# Patient Record
Sex: Male | Born: 1955
Health system: Southern US, Community
[De-identification: ages and names within clinical notes are randomized; demographics above are authoritative.]

## PROBLEM LIST (undated history)

## (undated) DIAGNOSIS — R918 Other nonspecific abnormal finding of lung field: Secondary | ICD-10-CM

## (undated) DIAGNOSIS — R9439 Abnormal result of other cardiovascular function study: Secondary | ICD-10-CM

## (undated) DIAGNOSIS — E785 Hyperlipidemia, unspecified: Secondary | ICD-10-CM

## (undated) DIAGNOSIS — Z87891 Personal history of nicotine dependence: Secondary | ICD-10-CM

## (undated) DIAGNOSIS — E119 Type 2 diabetes mellitus without complications: Secondary | ICD-10-CM

## (undated) DIAGNOSIS — I255 Ischemic cardiomyopathy: Secondary | ICD-10-CM

## (undated) DIAGNOSIS — I5020 Unspecified systolic (congestive) heart failure: Secondary | ICD-10-CM

## (undated) DIAGNOSIS — I5022 Chronic systolic (congestive) heart failure: Secondary | ICD-10-CM

## (undated) DIAGNOSIS — I2510 Atherosclerotic heart disease of native coronary artery without angina pectoris: Secondary | ICD-10-CM

## (undated) DIAGNOSIS — Z720 Tobacco use: Secondary | ICD-10-CM

## (undated) HISTORY — DX: Unspecified systolic (congestive) heart failure: I50.20

## (undated) HISTORY — DX: Personal history of nicotine dependence: Z87.891

## (undated) HISTORY — DX: Other nonspecific abnormal finding of lung field: R91.8

## (undated) HISTORY — PX: TONSILLECTOMY: SHX5217

## (undated) HISTORY — DX: Abnormal result of other cardiovascular function study: R94.39

## (undated) HISTORY — PX: TONSILLECTOMY: SUR1361

## (undated) HISTORY — DX: Type 2 diabetes mellitus without complications: E11.9

## (undated) HISTORY — DX: Chronic systolic (congestive) heart failure: I50.22

---

## 2007-08-06 ENCOUNTER — Ambulatory Visit: Payer: Self-pay | Admitting: Internal Medicine

## 2008-08-02 IMAGING — CR DG CHEST 2V
1 series · 2 of 2 positions shown · non-contrast
Comparison: none

REASON FOR EXAM: rt lower  chest pain
COMMENTS:

[Series 1: view not recorded · 0.17mm/px · 2 of 2 slices shown]
[im 1/2]
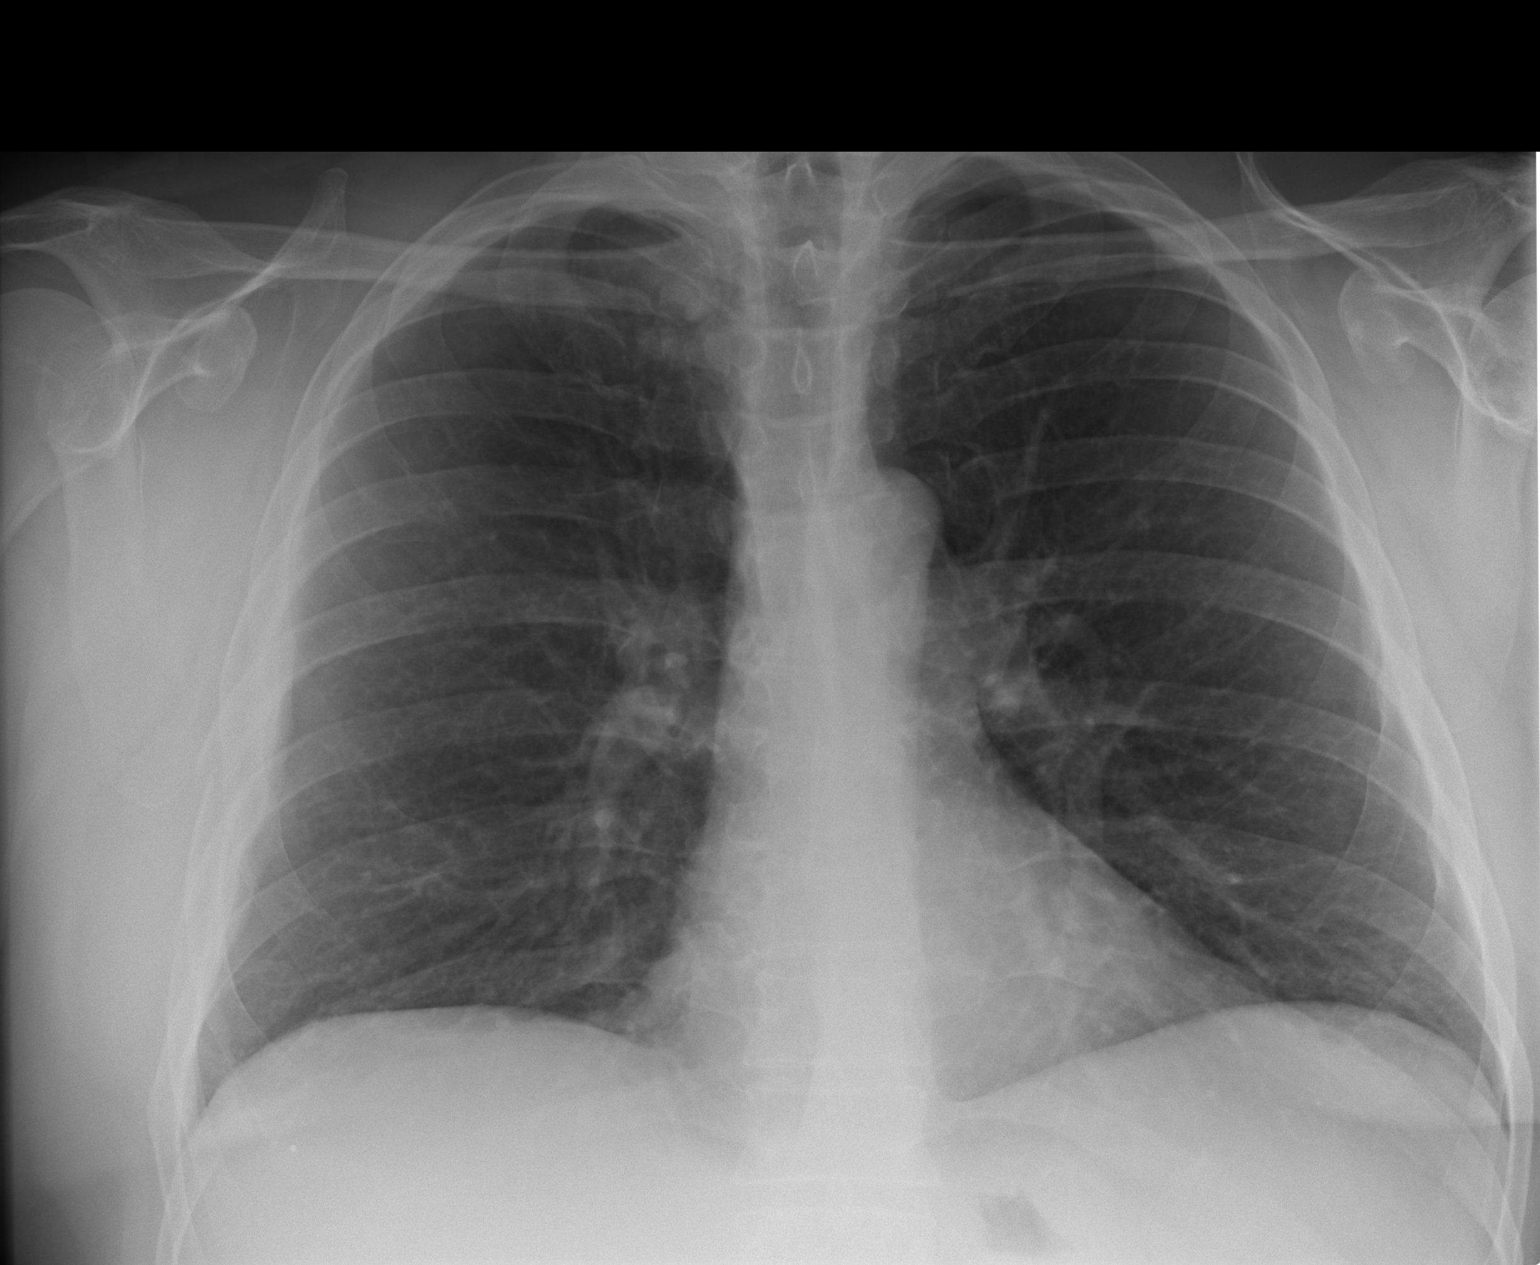
[im 2/2]
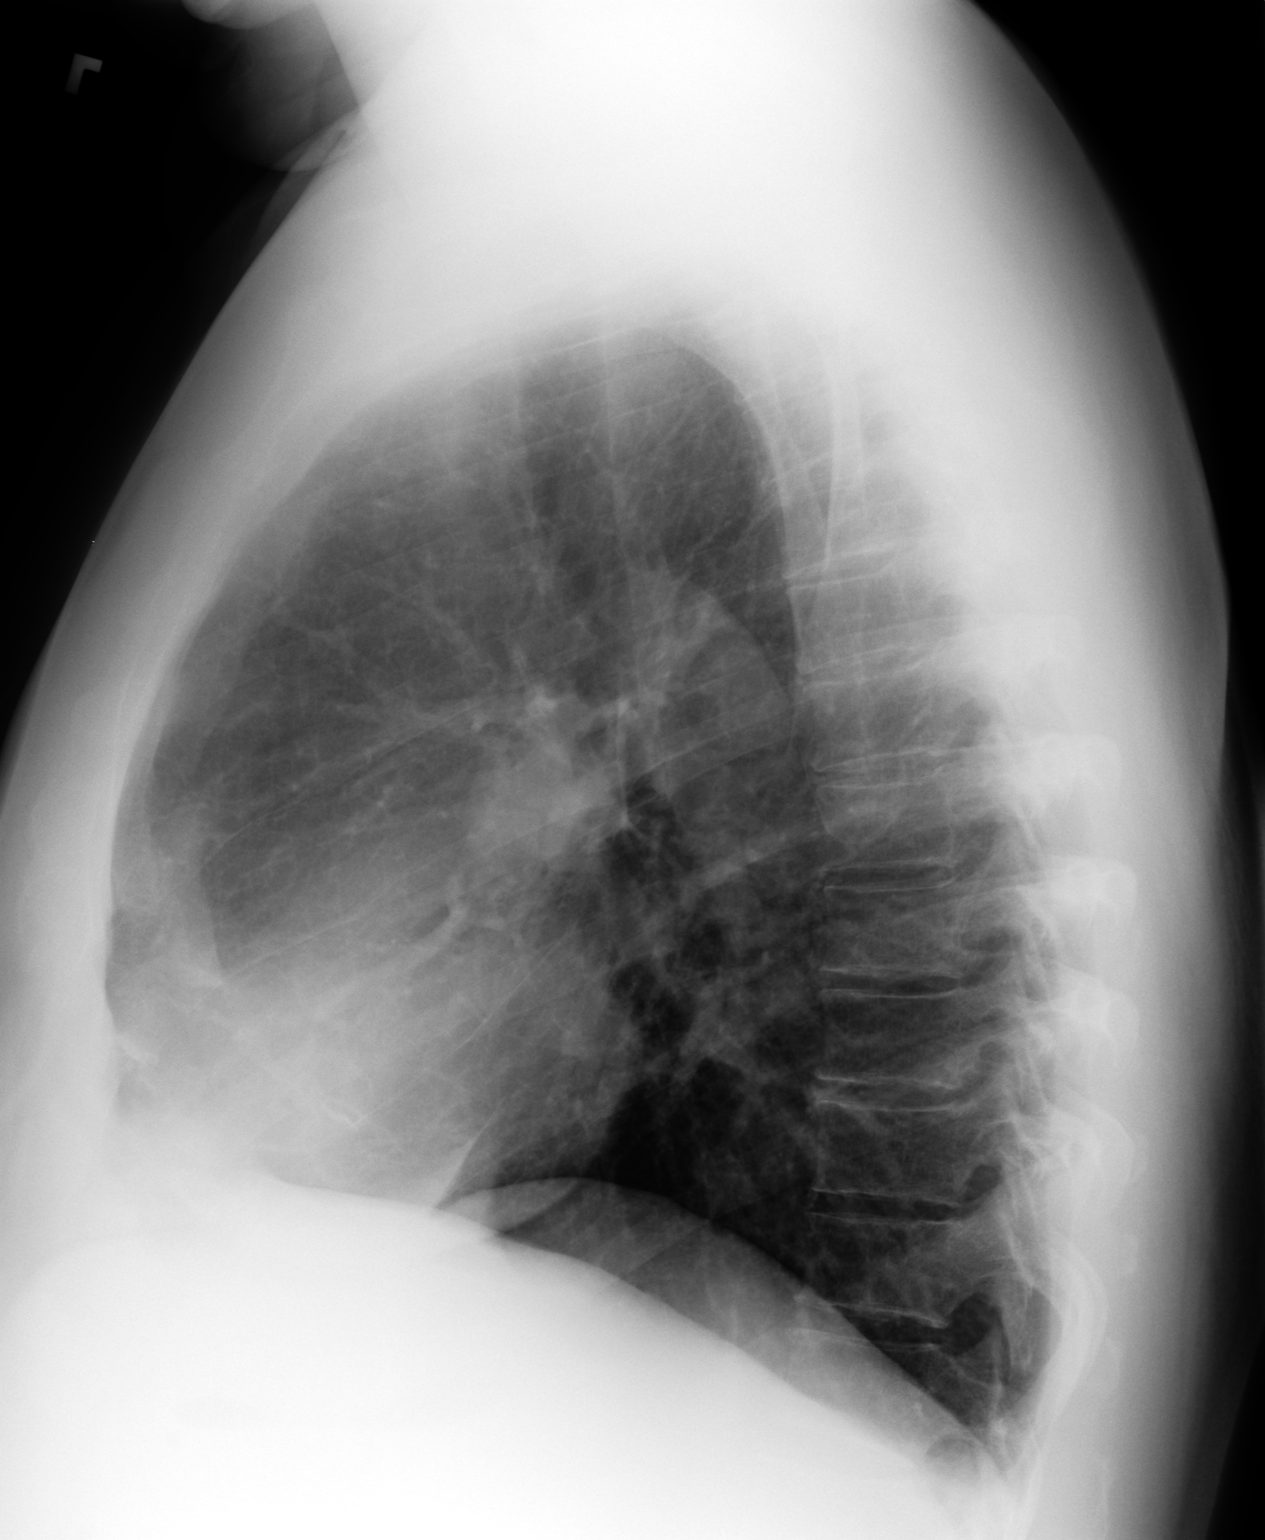

[2 of 2 positions shown; findings below may reference images not displayed]

PROCEDURE:     DXR - DXR CHEST PA (OR AP) AND LATERAL  - August 06, 2007 [DATE]

RESULT:     There is no prior exam for comparison. The lungs are fully
inflated. The patient has RIGHT rib fractures as demonstrated on the
previous rib series. There is no infiltrate, effusion or pneumothorax. There
is no pulmonary edema. The cardiac silhouette is normal in size.
IMPRESSION: No evidence of pneumothorax. No acute cardiopulmonary disease. RIGHT rib
fractures as seen on the separately dictated rib series.

## 2014-10-22 ENCOUNTER — Ambulatory Visit (INDEPENDENT_AMBULATORY_CARE_PROVIDER_SITE_OTHER): Payer: Self-pay | Admitting: Family Medicine

## 2014-10-22 VITALS — BP 126/80 | HR 105 | Temp 98.2°F | Resp 18 | Ht 69.0 in | Wt 286.0 lb

## 2014-10-22 DIAGNOSIS — Z029 Encounter for administrative examinations, unspecified: Secondary | ICD-10-CM

## 2014-10-22 DIAGNOSIS — Z024 Encounter for examination for driving license: Secondary | ICD-10-CM

## 2014-10-22 DIAGNOSIS — J42 Unspecified chronic bronchitis: Secondary | ICD-10-CM

## 2014-10-22 NOTE — Patient Instructions (Signed)
Lose weight  Stop smoking  Get a physical sometime soon.

## 2014-10-22 NOTE — Progress Notes (Signed)
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 He said he was up to 335 pounds and is lost quite a bit of weight. He is working hard at that.  Past medical history Operations: None except for his tonsillectomy Medical illnesses: No major illnesses Medication allergies: None Regular medications: None   Family history: Mother died of diabetes, father of an infected ulcer on his spine  Social history: Married, [place], wife is a Engineer, civil (consulting)nurse. He knows he needs to start getting regular exercise but has not been doing so at this time.  Review of systems: Unremarkable  Physical exam: Overweight male in no major distress. His TMs normal. Eyes PERRLA. Throat clear. Neck supple without nodes. Chest is course and deep breathing triggers coughing.Marland Kitchen. Heart regular without murmurs gallops. No carotid bruits. Abdomen is large, nor Megaly, masses or tenderness. He has a bruise on the right abdominal wall from pushing equipment with his body. Normal male external genitalia with testes descended. No hernias. Extremities unremarkable. Skin otherwise unremarkable.  Assessment: DOT physical examination Obesity Pulmonary congestion, probably chronic bronchitis from smoking  Plan: 2 year DOT card Urged him to continue working hard on weight loss Urgent to get away from the cigarettes. He should still get a complete physical with labs if he has not done so in recent years.

## 2015-09-30 ENCOUNTER — Inpatient Hospital Stay (HOSPITAL_COMMUNITY)
Admit: 2015-09-30 | Discharge: 2015-09-30 | Disposition: A | Discharge status: Home / Self Care | Payer: 59 | Attending: Cardiovascular Disease | Admitting: Cardiovascular Disease

## 2015-09-30 ENCOUNTER — Inpatient Hospital Stay
Admission: EM | Admit: 2015-09-30 | Discharge: 2015-10-02 | DRG: 247 | Disposition: A | Discharge status: Home / Self Care | Payer: 59 | Attending: Cardiovascular Disease | Admitting: Cardiovascular Disease

## 2015-09-30 ENCOUNTER — Encounter
Admission: EM | Disposition: A | Discharge status: Home / Self Care | Payer: Self-pay | Source: Home / Self Care | Attending: Cardiovascular Disease

## 2015-09-30 ENCOUNTER — Emergency Department: Payer: 59

## 2015-09-30 ENCOUNTER — Encounter: Payer: Self-pay | Admitting: *Deleted

## 2015-09-30 DIAGNOSIS — Z87891 Personal history of nicotine dependence: Secondary | ICD-10-CM

## 2015-09-30 DIAGNOSIS — I2102 ST elevation (STEMI) myocardial infarction involving left anterior descending coronary artery: Principal | ICD-10-CM | POA: Diagnosis present

## 2015-09-30 DIAGNOSIS — I213 ST elevation (STEMI) myocardial infarction of unspecified site: Secondary | ICD-10-CM | POA: Diagnosis not present

## 2015-09-30 DIAGNOSIS — I255 Ischemic cardiomyopathy: Secondary | ICD-10-CM | POA: Diagnosis present

## 2015-09-30 DIAGNOSIS — E1165 Type 2 diabetes mellitus with hyperglycemia: Secondary | ICD-10-CM | POA: Diagnosis present

## 2015-09-30 DIAGNOSIS — Z8249 Family history of ischemic heart disease and other diseases of the circulatory system: Secondary | ICD-10-CM | POA: Diagnosis not present

## 2015-09-30 DIAGNOSIS — I2582 Chronic total occlusion of coronary artery: Secondary | ICD-10-CM | POA: Diagnosis present

## 2015-09-30 DIAGNOSIS — E785 Hyperlipidemia, unspecified: Secondary | ICD-10-CM | POA: Diagnosis present

## 2015-09-30 DIAGNOSIS — Z720 Tobacco use: Secondary | ICD-10-CM

## 2015-09-30 DIAGNOSIS — I2510 Atherosclerotic heart disease of native coronary artery without angina pectoris: Secondary | ICD-10-CM | POA: Diagnosis present

## 2015-09-30 DIAGNOSIS — E1169 Type 2 diabetes mellitus with other specified complication: Secondary | ICD-10-CM

## 2015-09-30 DIAGNOSIS — R079 Chest pain, unspecified: Secondary | ICD-10-CM | POA: Diagnosis not present

## 2015-09-30 DIAGNOSIS — Z6841 Body Mass Index (BMI) 40.0 and over, adult: Secondary | ICD-10-CM | POA: Diagnosis not present

## 2015-09-30 DIAGNOSIS — Z833 Family history of diabetes mellitus: Secondary | ICD-10-CM

## 2015-09-30 DIAGNOSIS — E118 Type 2 diabetes mellitus with unspecified complications: Secondary | ICD-10-CM | POA: Insufficient documentation

## 2015-09-30 HISTORY — PX: CARDIAC CATHETERIZATION: SHX172

## 2015-09-30 HISTORY — DX: Atherosclerotic heart disease of native coronary artery without angina pectoris: I25.10

## 2015-09-30 HISTORY — DX: Hyperlipidemia, unspecified: E78.5

## 2015-09-30 HISTORY — DX: Tobacco use: Z72.0

## 2015-09-30 HISTORY — DX: Morbid (severe) obesity due to excess calories: E66.01

## 2015-09-30 HISTORY — DX: Ischemic cardiomyopathy: I25.5

## 2015-09-30 LAB — BASIC METABOLIC PANEL
Anion gap: 9 (ref 5–15)
BUN: 19 mg/dL (ref 6–20)
CO2: 24 mmol/L (ref 22–32)
Calcium: 8.8 mg/dL — ABNORMAL LOW (ref 8.9–10.3)
Chloride: 102 mmol/L (ref 101–111)
Creatinine, Ser: 1.05 mg/dL (ref 0.61–1.24)
GFR calc Af Amer: >60 mL/min (ref >60)
GFR calc non Af Amer: >60 mL/min (ref >60)
Glucose, Bld: 278 mg/dL — ABNORMAL HIGH (ref 65–99)
Potassium: 3.8 mmol/L (ref 3.5–5.1)
Sodium: 135 mmol/L (ref 135–145)

## 2015-09-30 LAB — PROTIME-INR
INR: 1.05
Prothrombin Time: 13.9 seconds (ref 11.4–15.0)

## 2015-09-30 LAB — TROPONIN I
Troponin I: 0.06 ng/mL — ABNORMAL HIGH (ref <0.031)
Troponin I: 39.8 ng/mL — ABNORMAL HIGH (ref <0.031)

## 2015-09-30 LAB — CBC
HCT: 42.5 % (ref 40.0–52.0)
Hemoglobin: 14.4 g/dL (ref 13.0–18.0)
MCH: 29.3 pg (ref 26.0–34.0)
MCHC: 33.9 g/dL (ref 32.0–36.0)
MCV: 86.5 fL (ref 80.0–100.0)
Platelets: 224 10*3/uL (ref 150–440)
RBC: 4.92 MIL/uL (ref 4.40–5.90)
RDW: 14.1 % (ref 11.5–14.5)
WBC: 9.1 10*3/uL (ref 3.8–10.6)

## 2015-09-30 LAB — ECHOCARDIOGRAM COMPLETE
Height: 71 in
Weight: 4800 oz

## 2015-09-30 LAB — APTT: aPTT: 68 seconds — ABNORMAL HIGH (ref 24–36)

## 2015-09-30 LAB — MRSA PCR SCREENING: MRSA by PCR: NEGATIVE

## 2015-09-30 SURGERY — LEFT HEART CATH AND CORONARY ANGIOGRAPHY
Anesthesia: Moderate Sedation

## 2015-09-30 MED ORDER — ONDANSETRON HCL 4 MG/2ML IJ SOLN: 4.0000 mg | Freq: Four times a day (QID) | INTRAMUSCULAR | Status: DC | PRN

## 2015-09-30 MED ORDER — HEPARIN (PORCINE) IN NACL 100-0.45 UNIT/ML-% IJ SOLN
1400.0000 [IU]/h | INTRAMUSCULAR | Status: DC
  Filled 2015-09-30: qty 250

## 2015-09-30 MED ORDER — VERAPAMIL HCL 2.5 MG/ML IV SOLN
INTRAVENOUS | Status: DC | PRN
  Administered 2015-09-30: 2.5 mg via INTRA_ARTERIAL

## 2015-09-30 MED ORDER — TICAGRELOR 90 MG PO TABS
ORAL_TABLET | ORAL | Status: AC
  Filled 2015-09-30: qty 2

## 2015-09-30 MED ORDER — MIDAZOLAM HCL 2 MG/2ML IJ SOLN
INTRAMUSCULAR | Status: AC
  Filled 2015-09-30: qty 2

## 2015-09-30 MED ORDER — IOHEXOL 300 MG/ML  SOLN
INTRAMUSCULAR | Status: DC | PRN
  Administered 2015-09-30: 180 mL via INTRA_ARTERIAL

## 2015-09-30 MED ORDER — SODIUM CHLORIDE 0.9 % IV SOLN: INTRAVENOUS | Status: AC

## 2015-09-30 MED ORDER — NITROGLYCERIN 1 MG/10 ML FOR IR/CATH LAB
INTRA_ARTERIAL | Status: DC | PRN
  Administered 2015-09-30: 200 ug via INTRACORONARY

## 2015-09-30 MED ORDER — CETYLPYRIDINIUM CHLORIDE 0.05 % MT LIQD
7.0000 mL | Freq: Two times a day (BID) | OROMUCOSAL | Status: DC
  Administered 2015-09-30 – 2015-10-01 (×4): 7 mL via OROMUCOSAL

## 2015-09-30 MED ORDER — ACETAMINOPHEN 325 MG PO TABS: 650.0000 mg | ORAL_TABLET | ORAL | Status: DC | PRN

## 2015-09-30 MED ORDER — HEPARIN SODIUM (PORCINE) 1000 UNIT/ML IJ SOLN
INTRAMUSCULAR | Status: DC | PRN
  Administered 2015-09-30: 3000 [IU] via INTRAVENOUS
  Administered 2015-09-30: 4000 [IU] via INTRAVENOUS

## 2015-09-30 MED ORDER — HEPARIN SODIUM (PORCINE) 5000 UNIT/ML IJ SOLN
4000.0000 [IU] | Freq: Once | INTRAMUSCULAR | Status: AC
  Administered 2015-09-30: 4000 [IU] via INTRAVENOUS

## 2015-09-30 MED ORDER — TICAGRELOR 90 MG PO TABS
90.0000 mg | ORAL_TABLET | Freq: Two times a day (BID) | ORAL | Status: DC
  Administered 2015-09-30 – 2015-10-02 (×4): 90 mg via ORAL
  Filled 2015-09-30 (×4): qty 1

## 2015-09-30 MED ORDER — HEPARIN SODIUM (PORCINE) 1000 UNIT/ML IJ SOLN
INTRAMUSCULAR | Status: AC
  Filled 2015-09-30: qty 1

## 2015-09-30 MED ORDER — PERFLUTREN LIPID MICROSPHERE
INTRAVENOUS | Status: AC
Start: 2015-09-30 — End: 2015-09-30
  Administered 2015-09-30: 14:00:00
  Filled 2015-09-30: qty 10

## 2015-09-30 MED ORDER — FENTANYL CITRATE (PF) 100 MCG/2ML IJ SOLN
INTRAMUSCULAR | Status: DC | PRN
  Administered 2015-09-30 (×2): 50 ug via INTRAVENOUS

## 2015-09-30 MED ORDER — TICAGRELOR 90 MG PO TABS
ORAL_TABLET | ORAL | Status: DC | PRN
  Administered 2015-09-30: 180 mg via ORAL

## 2015-09-30 MED ORDER — VERAPAMIL HCL 2.5 MG/ML IV SOLN
INTRAVENOUS | Status: AC
  Filled 2015-09-30: qty 2

## 2015-09-30 MED ORDER — ASPIRIN 81 MG PO CHEW
81.0000 mg | CHEWABLE_TABLET | Freq: Every day | ORAL | Status: DC
  Administered 2015-10-01 – 2015-10-02 (×2): 81 mg via ORAL
  Filled 2015-09-30 (×2): qty 1

## 2015-09-30 MED ORDER — ATORVASTATIN CALCIUM 20 MG PO TABS
80.0000 mg | ORAL_TABLET | Freq: Every day | ORAL | Status: DC
  Administered 2015-09-30 – 2015-10-01 (×2): 80 mg via ORAL
  Filled 2015-09-30 (×2): qty 4

## 2015-09-30 MED ORDER — METOPROLOL TARTRATE 25 MG PO TABS
25.0000 mg | ORAL_TABLET | Freq: Two times a day (BID) | ORAL | Status: DC
  Administered 2015-09-30 (×2): 25 mg via ORAL
  Filled 2015-09-30 (×2): qty 1

## 2015-09-30 MED ORDER — SODIUM CHLORIDE 0.9 % IV SOLN: 250.0000 mL | INTRAVENOUS | Status: DC | PRN

## 2015-09-30 MED ORDER — BIVALIRUDIN 250 MG IV SOLR
INTRAVENOUS | Status: AC
  Filled 2015-09-30: qty 250

## 2015-09-30 MED ORDER — HEPARIN (PORCINE) IN NACL 2-0.9 UNIT/ML-% IJ SOLN
INTRAMUSCULAR | Status: AC
  Filled 2015-09-30: qty 1000

## 2015-09-30 MED ORDER — NITROGLYCERIN 5 MG/ML IV SOLN
INTRAVENOUS | Status: AC
  Filled 2015-09-30: qty 10

## 2015-09-30 MED ORDER — MIDAZOLAM HCL 2 MG/2ML IJ SOLN
INTRAMUSCULAR | Status: DC | PRN
  Administered 2015-09-30 (×2): 1 mg via INTRAVENOUS

## 2015-09-30 MED ORDER — SODIUM CHLORIDE 0.9% FLUSH
3.0000 mL | Freq: Two times a day (BID) | INTRAVENOUS | Status: DC
  Administered 2015-09-30 – 2015-10-02 (×5): 3 mL via INTRAVENOUS

## 2015-09-30 MED ORDER — LISINOPRIL 5 MG PO TABS
5.0000 mg | ORAL_TABLET | Freq: Every day | ORAL | Status: DC
  Administered 2015-09-30 – 2015-10-02 (×3): 5 mg via ORAL
  Filled 2015-09-30 (×3): qty 1

## 2015-09-30 MED ORDER — SODIUM CHLORIDE 0.9% FLUSH: 3.0000 mL | INTRAVENOUS | Status: DC | PRN

## 2015-09-30 MED ORDER — FENTANYL CITRATE (PF) 100 MCG/2ML IJ SOLN
INTRAMUSCULAR | Status: AC
  Filled 2015-09-30: qty 2

## 2015-09-30 MED ORDER — NITROGLYCERIN 0.4 MG SL SUBL
0.4000 mg | SUBLINGUAL_TABLET | SUBLINGUAL | Status: DC | PRN
  Filled 2015-09-30: qty 1

## 2015-09-30 SURGICAL SUPPLY — 16 items
BALLN TREK RX 2.5X12 (BALLOONS) ×2
BALLN ~~LOC~~ TREK RX 3.5X12 (BALLOONS) ×2
BALLOON TREK RX 2.5X12 (BALLOONS) ×1 IMPLANT
BALLOON ~~LOC~~ TREK RX 3.5X12 (BALLOONS) ×1 IMPLANT
CATH 5F 110X4 TIG (CATHETERS) ×2 IMPLANT
CATH INFINITI 5FR ANG PIGTAIL (CATHETERS) ×2 IMPLANT
CATH VISTA GUIDE 6FR XBLAD3.5 (CATHETERS) ×2 IMPLANT
DEVICE INFLAT 30 PLUS (MISCELLANEOUS) ×2 IMPLANT
DEVICE RAD COMP TR BAND LRG (VASCULAR PRODUCTS) ×2 IMPLANT
GLIDESHEATH SLEND SS 6F .021 (SHEATH) ×2 IMPLANT
KIT MANI 3VAL PERCEP (MISCELLANEOUS) ×2 IMPLANT
PACK CARDIAC CATH (CUSTOM PROCEDURE TRAY) ×2 IMPLANT
SHEATH AVANTI 6FR X 11CM (SHEATH) ×2 IMPLANT
STENT XIENCE ALPINE RX 3.0X18 (Permanent Stent) ×2 IMPLANT
WIRE RUNTHROUGH .014X180CM (WIRE) ×2 IMPLANT
WIRE SAFE-T 1.5MM-J .035X260CM (WIRE) ×2 IMPLANT

## 2015-09-30 NOTE — Consult Note (Signed)
CARDIOLOGY CONSULT NOTE  Patient ID: Michael Jordan MRN: [id number] DOB/AGE: [birth year] 60 y.o.  Admit date: 09/30/2015 Referring Physician Dr. Pershing Jordan  Primary Cardiologist New Reason for Consultation chest pain  HPI:  This is a 60 year old male with no previous cardiac history who presented with chest pain. He has known history of morbid obesity and previous tobacco use.   he quit smoking in November. When he presented with chest pain which started at 6:40 AM. The chest left-sided and described as aching sensation radiating to his neck, jaw and left arm. The chest pain was initially severe 10 out of 10 and currently decreased to 5 out of 10. It is associated with shortness of breath. He reports intermittent episodes of similar chest pain in the past few weeks mostly with activities with resolution of symptoms with rest. He had few episodes of chest pain after eating but he denies any heartburn. No previous cardiac workup. He has family history of coronary artery disease. His mother had myocardial infarction in her early 2s. His father had valvular disease. Otherwise he denies any palpitations, syncope or presyncope. He is still actively with chest pain.  A 10 point review of system was performed. It is negative other than that mentioned in the history of present illness.   No past medical history on file.  Family History  Problem Relation Age of Onset  . Diabetes Mother     Social History   Social History  . Marital Status: Married    Spouse Name: N/A  . Number of Children: N/A  . Years of Education: N/A   Occupational History  . Not on file.   Social History Main Topics  . Smoking status: Current Every Day Smoker -- 0.50 packs/day for 44 years    Types: Cigarettes  . Smokeless tobacco: Not on file  . Alcohol Use: 0.0 oz/week    0 Standard drinks or equivalent per week  . Drug Use: No  . Sexual Activity: Not on file   Other Topics Concern  . Not on file   Social  History Narrative    Past Surgical History  Procedure Laterality Date  . Tonsillectomy        (Not in a hospital admission)  Physical Exam: Blood pressure 166/99, pulse 74, temperature 98.1 F (36.7 C), temperature source Oral, resp. rate 11, height  (1.803 m), weight 300 lb (136.079 kg), SpO2 95 %.   Constitutional:  oriented to person, place, and time. He appears well-developed and well-nourished. No distress.  HENT: No nasal discharge.  Head: Normocephalic and atraumatic.  Eyes: Pupils are equal and round.  No discharge. Neck: Normal range of motion. Neck supple. No JVD present. No thyromegaly present.  Cardiovascular: Normal rate, regular rhythm, normal heart sounds. Exam reveals no gallop and no friction rub. No murmur heard.  Pulmonary/Chest: Effort normal and breath sounds normal. No stridor. No respiratory distress.  no wheezes or rales.   Abdominal: Soft. Bowel sounds are normal. He exhibits no distension. There is no tenderness. There is no rebound and no guarding.  Musculoskeletal: Normal range of motion. No edema and no tenderness.  Neurological: Alert and oriented to person, place, and time. Coordination normal.  Skin: Skin is warm and dry. No rash noted. He is not diaphoretic. No erythema. No pallor.  Psychiatric: Normal mood and affect.  behavior is normal. Judgment and thought content normal.     Labs:   Lab Results  Component Value Date   WBC 9.1  09/30/2015   HGB 14.4 09/30/2015   HCT 42.5 09/30/2015   MCV 86.5 09/30/2015   PLT 224 09/30/2015   No results for input(s): NA, K, CL, CO2, BUN, CREATININE, CALCIUM, PROT, BILITOT, ALKPHOS, ALT, AST, GLUCOSE in the last 168 hours.  Invalid input(s): LABALBU No results found for: CKTOTAL, CKMB, CKMBINDEX, TROPONINI     EKG: Personally reviewed by me and showed  normal sinus rhythm with 2 mm ST elevation in V1 and 1 mm of ST elevation in aVR with 1-2 mm of ST depression in the inferior leads  ASSESSMENT AND  PLAN:     1. Possible anterior ST elevation myocardial infarction: The EKG is not diagnostic for ST elevation myocardial infarction given that there is no ST elevation in 2 contiguous leads. Nonetheless, it has significant abnormal changes that are concerning especially the ST depression in the inferior leads. He is still actively having chest pain and thus I have recommended proceeding with urgent cardiac catheterization and possible coronary intervention. I discussed the procedure in details as well as risks and benefits with him and his wife who is an LPN. The patient was given 4000 units of unfractionated heparin and aspirin. Further recommendations to follow after cardiac catheterization.  Signed: Lorine BearsMuhammad Costella Schwarz MD, [facility] 09/30/2015, 8:48 AM

## 2015-09-30 NOTE — ED Notes (Signed)
Pt reports for the last 3 days, chest pain worse today with radiation to bilateral arms and jaw

## 2015-09-30 NOTE — ED Provider Notes (Signed)
[facility] Emergency Department Provider Note  ____________________________________________  Time seen: Approximately 810 AM  I have reviewed the triage vital signs and the nursing notes.   HISTORY  Chief Complaint Chest Pain    HPI Michael Jordan is a 60 y.o. male with a strong family history of cardiac disease was presenting with 30 minutes of left-sided chest pain radiating to his bilateral arms. He has associated shortness of breath. No nausea vomiting or diaphoresis. He says the pain has been ongoing for several weeks and intermittent. He says he has been taking Tums without any relief. Has any medical history but also says that he has not being followed at this time any primary care doctor regularly. Says he stopped smoking in November. Says the pain is a 5 out of 10 now and feels like his chest is "exploding."Says that he also took 650 mg of aspirin this morning before arriving to the hospital.   No past medical history on file.  There are no active problems to display for this patient.   Past Surgical History  Procedure Laterality Date  . Tonsillectomy      No current outpatient prescriptions on file.  Allergies Review of patient's allergies indicates no known allergies.  Family History  Problem Relation Age of Onset  . Diabetes Mother     Social History Social History  Substance Use Topics  . Smoking status: Current Every Day Smoker -- 0.50 packs/day for 44 years    Types: Cigarettes  . Smokeless tobacco: None  . Alcohol Use: 0.0 oz/week    0 Standard drinks or equivalent per week    Review of Systems Constitutional: No fever/chills Eyes: No visual changes. ENT: No sore throat. Cardiovascular: As above Respiratory: As above Gastrointestinal: No abdominal pain.  No nausea, no vomiting.  No diarrhea.  No constipation. Genitourinary: Negative for dysuria. Musculoskeletal: Negative for back pain. Skin: Negative for  rash. Neurological: Negative for headaches, focal weakness or numbness.  10-point ROS otherwise negative.  ____________________________________________   PHYSICAL EXAM:  VITAL SIGNS: ED Triage Vitals  Enc Vitals Group     BP 09/30/15 0807 161/94 mmHg     Pulse Rate 09/30/15 0807 78     Resp 09/30/15 0807 20     Temp 09/30/15 0807 98.1 F (36.7 C)     Temp Source 09/30/15 0807 Oral     SpO2 09/30/15 0807 97 %     Weight 09/30/15 0807 300 lb (136.079 kg)     Height 09/30/15 0807  (1.803 m)     Head Cir --      Peak Flow --      Pain Score 09/30/15 0809 5     Pain Loc --      Pain Edu? --      Excl. in GC? --     Constitutional: Alert and oriented. Breathing heavily. Eyes: Conjunctivae are normal. PERRL. EOMI. Head: Atraumatic. Nose: No congestion/rhinnorhea. Mouth/Throat: Mucous membranes are moist.   Neck: No stridor.   Cardiovascular: Normal rate, regular rhythm. Grossly normal heart sounds.  Good peripheral circulation with bilateral radial pulses which are equal. Respiratory: Normal respiratory effort.  No retractions. Lungs CTAB. Gastrointestinal: Soft and nontender. No distention. No abdominal bruits. No CVA tenderness. Musculoskeletal: No lower extremity tenderness nor edema.  No joint effusions. Neurologic:  Normal speech and language. No gross focal neurologic deficits are appreciated.  Skin:  Skin is warm, dry and intact. No rash noted. Psychiatric: Mood and affect  are normal. Speech and behavior are normal.  ____________________________________________   LABS (all labs ordered are listed, but only abnormal results are displayed)  Labs Reviewed  BASIC METABOLIC PANEL  CBC  TROPONIN I   ____________________________________________  EKG  ED ECG REPORT I, Schaevitz,  Teena Iraniavid M, the attending physician, personally viewed and interpreted this ECG.   Date: 09/30/2015  EKG Time: 806  Rate: 76  Rhythm: normal sinus rhythm  Axis: Normal   Intervals:none  ST&T Change: 1 mm ST elevation in aVR as well as V1 with 1-2 mm depression in 2, 3 and aVF.  ED ECG REPORT I, Arelia LongestSchaevitz,  David M, the attending physician, personally viewed and interpreted this ECG.   Date: 09/30/2015  EKG Time: 824  Rate: 73  Rhythm: normal sinus rhythm  Axis: Normal  Intervals:none  ST&T Change: ST elevation in aVR as well as V1 consistent with previous EKG. 2 mm ST depression in 2 as well as close to 1 mm depression in 3 and aVF.  ____________________________________________  RADIOLOGY  Pending chest x-ray ____________________________________________   PROCEDURES  CRITICAL CARE Performed by: Arelia LongestSchaevitz,  David M   Total critical care time: 35 minutes  Critical care time was exclusive of separately billable procedures and treating other patients.  Critical care was necessary to treat or prevent imminent or life-threatening deterioration.  Critical care was time spent personally by me on the following activities: development of treatment plan with patient and/or surrogate as well as nursing, discussions with consultants, evaluation of patient's response to treatment, examination of patient, obtaining history from patient or surrogate, ordering and performing treatments and interventions, ordering and review of laboratory studies, ordering and review of radiographic studies, pulse oximetry and re-evaluation of patient's condition.   ____________________________________________   INITIAL IMPRESSION / ASSESSMENT AND PLAN / ED COURSE  Pertinent labs & imaging results that were available during my care of the patient were reviewed by me and considered in my medical decision making (see chart for details).  ----------------------------------------- 8:40 AM on 09/30/2015 -----------------------------------------  Suspicious-looking initial EKG. EKG repeated without any significant change from the first. No previous on record for comparison.  Dr. Kirke CorinArida was called immediately after the repeat EKG for the suspicion for an ST elevation myocardial infarction. He quickly came to the bedside and will be taking the patient immediately to the Cath Lab. The patient will be given heparin in the emergency department. STEMI alert called. ____________________________________________   FINAL CLINICAL IMPRESSION(S) / ED DIAGNOSES  STEMI.    Myrna Blazeravid Matthew Schaevitz, MD 09/30/15 [phone]

## 2015-09-30 NOTE — Progress Notes (Signed)
A&Ox4.  Talkative and laughing throughout shift since admission. NSR per cardiac monitor. VSS. No respiratory distress and denied chest pain throughout shift. Tolerated heart healthy diet for dinner. Right radial site free of bruising and hematoma, no bleeding with dressing intact. +2 bilateral radial and pedal pulses. Wife and daughter at bedside.

## 2015-09-30 NOTE — ED Notes (Signed)
EKG transported by tech, Elita QuickPam

## 2015-09-30 NOTE — Progress Notes (Signed)
*  PRELIMINARY RESULTS* Echocardiogram 2D Echocardiogram has been performed.  Michael HousekeeperJerry R Hege 09/30/2015, 2:27 PM

## 2015-09-30 NOTE — Progress Notes (Signed)
RN made Dr. Kirke CorinArida aware via telephone of troponin of 39.8, MD acknowledged giving no new orders.

## 2015-10-01 ENCOUNTER — Encounter: Payer: Self-pay | Admitting: Nurse Practitioner

## 2015-10-01 DIAGNOSIS — I2510 Atherosclerotic heart disease of native coronary artery without angina pectoris: Secondary | ICD-10-CM

## 2015-10-01 DIAGNOSIS — E1169 Type 2 diabetes mellitus with other specified complication: Secondary | ICD-10-CM

## 2015-10-01 DIAGNOSIS — I255 Ischemic cardiomyopathy: Secondary | ICD-10-CM

## 2015-10-01 DIAGNOSIS — Z720 Tobacco use: Secondary | ICD-10-CM

## 2015-10-01 DIAGNOSIS — E785 Hyperlipidemia, unspecified: Secondary | ICD-10-CM

## 2015-10-01 HISTORY — DX: Tobacco use: Z72.0

## 2015-10-01 LAB — CBC
HCT: 40 % (ref 40.0–52.0)
Hemoglobin: 13.6 g/dL (ref 13.0–18.0)
MCH: 29.5 pg (ref 26.0–34.0)
MCHC: 34.1 g/dL (ref 32.0–36.0)
MCV: 86.3 fL (ref 80.0–100.0)
Platelets: 206 10*3/uL (ref 150–440)
RBC: 4.63 MIL/uL (ref 4.40–5.90)
RDW: 14.3 % (ref 11.5–14.5)
WBC: 10.3 10*3/uL (ref 3.8–10.6)

## 2015-10-01 LAB — HEPATIC FUNCTION PANEL
ALT: 43 U/L (ref 17–63)
AST: 133 U/L — ABNORMAL HIGH (ref 15–41)
Albumin: 3.5 g/dL (ref 3.5–5.0)
Alkaline Phosphatase: 66 U/L (ref 38–126)
Bilirubin, Direct: 0.2 mg/dL (ref 0.1–0.5)
Indirect Bilirubin: 0.7 mg/dL (ref 0.3–0.9)
Total Bilirubin: 0.9 mg/dL (ref 0.3–1.2)
Total Protein: 6.1 g/dL — ABNORMAL LOW (ref 6.5–8.1)

## 2015-10-01 LAB — GLUCOSE, CAPILLARY
Glucose-Capillary: 261 mg/dL — ABNORMAL HIGH (ref 65–99)
Glucose-Capillary: 142 mg/dL — ABNORMAL HIGH (ref 65–99)
Glucose-Capillary: 211 mg/dL — ABNORMAL HIGH (ref 65–99)
Glucose-Capillary: 141 mg/dL — ABNORMAL HIGH (ref 65–99)
Glucose-Capillary: 158 mg/dL — ABNORMAL HIGH (ref 65–99)

## 2015-10-01 LAB — BASIC METABOLIC PANEL
Anion gap: 5 (ref 5–15)
BUN: 12 mg/dL (ref 6–20)
CO2: 26 mmol/L (ref 22–32)
Calcium: 8.3 mg/dL — ABNORMAL LOW (ref 8.9–10.3)
Chloride: 104 mmol/L (ref 101–111)
Creatinine, Ser: 0.92 mg/dL (ref 0.61–1.24)
GFR calc Af Amer: >60 mL/min (ref >60)
GFR calc non Af Amer: >60 mL/min (ref >60)
Glucose, Bld: 156 mg/dL — ABNORMAL HIGH (ref 65–99)
Potassium: 4 mmol/L (ref 3.5–5.1)
Sodium: 135 mmol/L (ref 135–145)

## 2015-10-01 LAB — LIPID PANEL
Cholesterol: 214 mg/dL — ABNORMAL HIGH (ref 0–200)
HDL: 41 mg/dL (ref >40)
LDL Cholesterol: 126 mg/dL — ABNORMAL HIGH (ref 0–99)
Total CHOL/HDL Ratio: 5.2 RATIO
Triglycerides: 234 mg/dL — ABNORMAL HIGH (ref <150)
VLDL: 47 mg/dL — ABNORMAL HIGH (ref 0–40)

## 2015-10-01 LAB — HEMOGLOBIN A1C: Hgb A1c MFr Bld: 8 % — ABNORMAL HIGH (ref 4.0–6.0)

## 2015-10-01 MED ORDER — CARVEDILOL 3.125 MG PO TABS
3.1250 mg | ORAL_TABLET | Freq: Two times a day (BID) | ORAL | Status: DC
  Administered 2015-10-01 – 2015-10-02 (×3): 3.125 mg via ORAL
  Filled 2015-10-01 (×3): qty 1

## 2015-10-01 MED ORDER — ASPIRIN 81 MG PO CHEW: 81.0000 mg | CHEWABLE_TABLET | Freq: Every day | ORAL | Status: DC

## 2015-10-01 MED ORDER — NITROGLYCERIN 0.4 MG SL SUBL: 0.4000 mg | SUBLINGUAL_TABLET | SUBLINGUAL | Status: DC | PRN

## 2015-10-01 MED ORDER — LISINOPRIL 5 MG PO TABS: 5.0000 mg | ORAL_TABLET | Freq: Every day | ORAL | Status: DC

## 2015-10-01 MED ORDER — ATORVASTATIN CALCIUM 80 MG PO TABS: 80.0000 mg | ORAL_TABLET | Freq: Every day | ORAL | Status: DC

## 2015-10-01 MED ORDER — INSULIN ASPART 100 UNIT/ML ~~LOC~~ SOLN
0.0000 [IU] | Freq: Three times a day (TID) | SUBCUTANEOUS | Status: DC
  Filled 2015-10-01: qty 4
  Filled 2015-10-01: qty 2

## 2015-10-01 MED ORDER — TICAGRELOR 90 MG PO TABS: 90.0000 mg | ORAL_TABLET | Freq: Two times a day (BID) | ORAL | Status: DC

## 2015-10-01 MED ORDER — CETYLPYRIDINIUM CHLORIDE 0.05 % MT LIQD: 7.0000 mL | Freq: Two times a day (BID) | OROMUCOSAL | Status: DC

## 2015-10-01 MED ORDER — CARVEDILOL 3.125 MG PO TABS: 3.1250 mg | ORAL_TABLET | Freq: Two times a day (BID) | ORAL | Status: DC

## 2015-10-01 NOTE — Progress Notes (Signed)
Skin checked by Maddie H. Room air. NSR. Takes meds ok. Refusing to take insulin. A & O. Wife at the bedside. Pt has not reported any pain. Urinal. Pt has no further concerns at this time.

## 2015-10-01 NOTE — Progress Notes (Signed)
Patient has remained alert and oriented times 4, he has rested well throughout the night.  Patient is in NSR per monitor and vital signs remain stable.  Patient has gotten out of bed a few times to use the urinal and bedside commode and tolerated it well.  Right radial site remains free from any bleeding or complications.  Will continue to monitor.

## 2015-10-01 NOTE — Progress Notes (Signed)
Pt remains A & O x 4 with no c/o pain. [facility] and lung sounds are clear to auscultation. Pt has remained in NSR on the cardiac monitor. Urinary and bowel output remain adequate. Pt refused his 0800 insulin for a bg of 142. Pt stated, "Im not diabetic, I don't need that". Pt was educated about the effects of high bg and the need to monitor bg while in the hospital. Pt will be transferred to telemetry this morning. Report was called to Leslie Dalesoll, Charity fundraiserN.

## 2015-10-01 NOTE — Progress Notes (Signed)
Patient Name: Michael Jordan Date of Encounter: 10/01/2015  Hospital Problem List     Principal Problem:   ST elevation (STEMI) myocardial infarction involving left anterior descending coronary artery [facility]) Active Problems:   CAD (coronary artery disease)   Cardiomyopathy, ischemic   Morbid obesity (HCC)   Tobacco abuse   Hyperlipidemia    Subjective   No c/p or sob overnight.  R wrist feels good - no bleeding or swelling.  Inpatient Medications    . antiseptic oral rinse  7 mL Mouth Rinse BID  . aspirin  81 mg Oral Daily  . atorvastatin  80 mg Oral q1800  . lisinopril  5 mg Oral Daily  . metoprolol tartrate  25 mg Oral BID  . sodium chloride flush  3 mL Intravenous Q12H  . ticagrelor  90 mg Oral BID    Vital Signs    Filed Vitals:   10/01/15 0300 10/01/15 0400 10/01/15 0500 10/01/15 0604  BP: 107/60 101/58 92/51 97/63   Pulse: 66 65 62 72  Temp:  98.4 F (36.9 C)    TempSrc:  Oral    Resp: Height:      Weight:      SpO2: 94% 97% 90% 98%    Intake/Output Summary (Last 24 hours) at 10/01/15 0729 Last data filed at 10/01/15 0600  Gross per 24 hour  Intake   1435 ml  Output   1550 ml  Net   -115 ml   Filed Weights   09/30/15 0807 09/30/15 1100  Weight: 300 lb (136.079 kg) 315 lb 14.7 oz (143.3 kg)    Physical Exam    General: Pleasant, NAD. Neuro: Alert and oriented X 3. Moves all extremities spontaneously. Psych: Normal affect. HEENT:  Normal  Neck: Supple without bruits.  Difficult to gauge JVP 2/2 girth. Lungs:  Resp regular and unlabored, CTA. Heart: RRR no s3, s4, or murmurs. Abdomen: Soft, non-tender, non-distended, BS + x 4.  Extremities: No clubbing, cyanosis or edema. DP/PT/Radials 2+ and equal bilaterally. R wrist cath site w/o bleeding/bruit/hematoma.  Labs    CBC  Recent Labs  09/30/15 0826 10/01/15 0344  WBC 9.1 10.3  HGB 14.4 13.6  HCT 42.5 40.0  MCV 86.5 86.3  PLT 224 206   Basic Metabolic Panel  Recent  Labs  16/10/96 0826 10/01/15 0344  NA 135 135  K 3.8 4.0  CL 102 104  CO2 24 26  GLUCOSE 278* 156*  BUN 19 12  CREATININE 1.05 0.92  CALCIUM 8.8* 8.3*   Liver Function Tests  Recent Labs  10/01/15 0344  AST 133*  ALT 43  ALKPHOS 66  BILITOT 0.9  PROT 6.1*  ALBUMIN 3.5   Cardiac Enzymes  Recent Labs  09/30/15 0826 09/30/15 1131  TROPONINI 0.06* 39.80*    Fasting Lipid Panel  Recent Labs  10/01/15 0344  CHOL 214*  HDL 41  LDLCALC 126*  TRIG 234*  CHOLHDL 5.2    Telemetry    RSR, Sinus tach.  ECG    RSR, 62, LAE, antsept infarct, no acute ST/T changes.  2D Echocardiogram 3.9.2017  Study Conclusions   - Procedure narrative: Transthoracic echocardiography. The study   was technically difficult. Intravenous contrast (Definity) was   administered. - Left ventricle: The cavity size was normal. Systolic function was   moderately reduced. The estimated ejection fraction was in the   range of 35% to 40%. Severe hypokinesis of the apical and   periapical  myocardium, including anteroapical wall. Mild to   moderate hypokinesis of the anterior and inferior myocardium.   Doppler parameters are consistent with abnormal left ventricular   relaxation (grade 1 diastolic dysfunction). - Left atrium: The atrium was normal in size. - Right ventricle: Systolic function was normal. - Pulmonary arteries: Systolic pressure was within the normal   range.   Radiology    No results found.  Assessment & Plan    1.  Acute Anterior STEMI/CAD:  Pt presented 3/9 with chest pain radiating to his neck, jaw, and left arm and was found to have anterior ST elevation.  Emergent cath revealed occluded LAD and RCA.  The LAD was felt to be the infarct vessel and was successfully treated with a DES.  Med Rx for CTO RCA.  No c/p or dyspnea overnight.  Troponin has risen to 39.8.  Hemodynamically stable.  Cont  blocker, acei, asa, brilinta, and high potency statin.  Tx to floor  today.  Cardiac rehab to see.  Hopefully d/c tomorrow.  2.  ICM:  EF 35-40% with sev apical, periapical, anteroapical HK and mild ant/inf HK.  Also Gr1 DD.  Euvolemic on exam.  No dyspnea, orthopnea, or edema.  Cont  blocker and acei.  Currently on metoprolol tartrate  will switch to low dose coreg in setting of LV dysfxn.  Watch BP as it has been soft.  Would not add spiro yet.  3.  HL:  LDL 126.  Lipitor 80 started 3/9.  LFT's wnl.  4.  Tob Abuse:  He quit in November 2016.  Continued avoidance encouraged.  5.  Morbid Obesity:  Cardiac rehab to see.  Will benefit from both rehab and aggressive nutritional counseling as an outpt.  Likely also needs sleep study.  6.  Hyperglycemia:  Gluc this AM is elevated @ 156.  Add SSI and check A1c.  Signed, Nicolasa Duckinghristopher Jaesean Litzau NP

## 2015-10-01 NOTE — Discharge Instructions (Signed)
**  PLEASE REMEMBER TO BRING ALL OF YOUR MEDICATIONS TO EACH OF YOUR FOLLOW-UP OFFICE VISITS. ° °NO HEAVY LIFTING X 4 WEEKS. °NO SEXUAL ACTIVITY X 4 WEEKS. °NO DRIVING X 2 WEEKS. °NO SOAKING BATHS, HOT TUBS, POOLS, ETC., X 7 DAYS. ° °Radial Site Care °Refer to this sheet in the next few weeks. These instructions provide you with information on caring for yourself after your procedure. Your caregiver may also give you more specific instructions. Your treatment has been planned according to current medical practices, but problems sometimes occur. Call your caregiver if you have any problems or questions after your procedure. °HOME CARE INSTRUCTIONS °· You may shower the day after the procedure. Remove the bandage (dressing) and gently wash the site with plain soap and water. Gently pat the site dry.  °· Do not apply powder or lotion to the site.  °· Do not submerge the affected site in water for 3 to 5 days.  °· Inspect the site at least twice daily.  °· Do not flex or bend the affected arm for 24 hours.  °· No lifting over 5 pounds (2.3 kg) for 5 days after your procedure.  °· Do not drive home if you are discharged the same day of the procedure. Have someone else drive you.  ° °What to expect: °· Any bruising will usually fade within 1 to 2 weeks.  °· Blood that collects in the tissue (hematoma) may be painful to the touch. It should usually decrease in size and tenderness within 1 to 2 weeks.  °SEEK IMMEDIATE MEDICAL CARE IF: °· You have unusual pain at the radial site.  °· You have redness, warmth, swelling, or pain at the radial site.  °· You have drainage (other than a small amount of blood on the dressing).  °· You have chills.  °· You have a fever or persistent symptoms for more than 72 hours.  °· You have a fever and your symptoms suddenly get worse.  °· Your arm becomes pale, cool, tingly, or numb.  °· You have heavy bleeding from the site. Hold pressure on the site.  °_____________ ° °  ° °10 Habits of Highly  Healthy People ° °[facility] wants to help you get well and stay well.  Live a longer, healthier life by practicing healthy habits every day. ° °1.  Visit your primary care provider regularly. °2.  Make time for family and friends.  Healthy relationships are important. °3.  Take medications as directed by your provider. °4.  Maintain a healthy weight and a trim waistline. °5.  Eat healthy meals and snacks, rich in fruits, vegetables, whole grains, and lean proteins. °6.  Get moving every day - aim for 150 minutes of moderate physical activity each week. °7.  Don't smoke. °8.  Avoid alcohol or drink in moderation. °9.  Manage stress through meditation or mindful relaxation. °10.  Get seven to nine hours of quality sleep each night. ° °Want more information on healthy habits?  To learn more about these and other healthy habits, visit [email or web address]. °_____________ °  °  °

## 2015-10-01 NOTE — Care Management (Signed)
Patient admitted with stemi and had emergent cardiac cath with pci.  He is being started on Brilinta.  Provided patient with 30 day coupon and subsequent refill copay coupon.  No other needs

## 2015-10-01 NOTE — Discharge Summary (Signed)
Discharge Summary    Patient ID: Michael Jordan,  MRN: [id number], DOB/AGE: 09-20-1955 60 y.o.  Admit date: 09/30/2015 Discharge date: 10/04/2015  Primary Care Provider: No primary care provider on file. Primary Cardiologist: M. Kirke Corin, MD   Discharge Diagnoses    Principal Problem:   ST elevation (STEMI) myocardial infarction involving left anterior descending coronary artery (HCC)  **S/P PCI/DES of the LAD this admission.  **Residual chronic total occlusion of the RCA  Med Rx.  Active Problems:   CAD (coronary artery disease)   Cardiomyopathy, ischemic  **EF 35-40% by echo this admission.   Morbid obesity (HCC)   Tobacco abuse (quit 05/2015)   Hyperlipidemia   Newly Dx Type II Diabetes Mellitus  **HbA1c = 8.0  **Metformin 500 mg BID started.  Allergies No Known Allergies  Diagnostic Studies/Procedures    Cardiac Catheterization and Percutaneous Coronary Intervention 3.9.2017  Coronary Findings     Dominance: Right    Left Main  . Vessel is angiographically normal.      Left Anterior Descending   . Prox LAD to Mid LAD lesion, 100% stenosed.   Marland Kitchen PCI: The LAD was successfully stented using a 3.0 x 18 mm Xience Alpine DES.     . First Diagonal Branch   . Ost 1st Diag to 1st Diag lesion, 40% stenosed.   . First Septal Branch   . Ost 1st Sept lesion, 40% stenosed.   . Third Diagonal Branch   The vessel is small in size and exhibits minimal luminal irregularities.      Left Circumflex   . Mid Cx lesion, 30% stenosed.   . First Obtuse Marginal Branch   The vessel is large in size and is angiographically normal.   . Third Obtuse Marginal Branch   The vessel exhibits minimal luminal irregularities.      Right Coronary Artery  Dist RCA filled by collaterals from Dist RCA.   Marland Kitchen Prox RCA lesion, 80% stenosed.   . Mid RCA lesion, 100% stenosed.   . Acute Marginal Branch   There is mild disease in the vessel. Acute Mrg filled by collaterals from Acute Mrg.         Left Ventricle The left ventricular size is normal. There is mild left ventricular systolic dysfunction. The left ventricular ejection fraction is 45-50% by visual estimate. There are wall motion abnormalities in the left ventricle. There are segmental wall motion abnormalities in the left ventricle.   Mitral Valve: There is no mitral valve regurgitation.   Aortic Valve: There is no aortic valve stenosis. _____________   2D Echocardiogram 3.9.2017  Study Conclusions   - Procedure narrative: Transthoracic echocardiography. The study   was technically difficult. Intravenous contrast (Definity) was   administered. - Left ventricle: The cavity size was normal. Systolic function was   moderately reduced. The estimated ejection fraction was in the   range of 35% to 40%. Severe hypokinesis of the apical and   periapical myocardium, including anteroapical wall. Mild to   moderate hypokinesis of the anterior and inferior myocardium.   Doppler parameters are consistent with abnormal left ventricular   relaxation (grade 1 diastolic dysfunction). - Left atrium: The atrium was normal in size. - Right ventricle: Systolic function was normal. - Pulmonary arteries: Systolic pressure was within the normal   range. _____________   History of Present Illness     60 y/o ? with a h/o morbid obesity and remote tobacco abuse, quitting in 05/2016. ? with a h/o morbid obesity and remote tobacco abuse, quitting in 05/2016.  He was  not previously on any medications at home.  He was in his usual state of health until the morning of 09/30/2015, when he had sudden onset of substernal chest pain radiating to his neck, jaw, and left arm, and associated with dyspnea.  He took Tums without relief and then [facility].  There, he was found to have subtle ST elevation in anterior leads and aVR with inferior ST depression.  A Code STEMI was activated and the patient was taken to the cath lab for emergent evaluation.  Hospital Course     Consultants: None   Patient  underwent emergent diagnostic catheterization revealing a total occlusion of the proximal LAD as well as what appeared to be a chronic total occlusion of the RCA with R  R collaterals.  The LAD was felt to be the infarct vessel and was successfully intervened upon using a 3.0 x 18 mm Xience Alpine DES.  Mr. Dan HumphreysWalker tolerated the procedure well and was admitted to the CCU post-PCI.  There, he ruled in for MI, eventually peaking his troponin at 39.80.  He had no further chest pain.  2D echocardiogram showed moderate LV dysfunction with an EF of 35-40%.  He has been maintained on  blocker, acei, asa, brilinta, and high potency statin therapy.  He was transferred out to the floor on 3/10 and has been ambulating without recurrent symptoms or limitations.  He will be discharged on 3/11 in good condition.  Of note, he was found to be hyperglycemic during admission.  He has no prior h/o diabetes. Hemoglobin A1c was sent off and returned at 8.0.  He refused sliding scale insulin throughout admission.  Dietary counseling was provided and he was d/c'd home on metformin 500 mg BID.  He was advised to obtain primary care follow-up. _____________  Discharge Vitals Blood pressure 123/57, pulse 70, temperature 97.6 F (36.4 C), temperature source Oral, resp. rate 19, height 5\' 11"  (1.803 m), weight 315 lb 14.7 oz (143.3 kg), SpO2 94 %.  Filed Weights   09/30/15 0807 09/30/15 1100  Weight: 300 lb (136.079 kg) 315 lb 14.7 oz (143.3 kg)    Labs & Radiologic Studies    CBC Lab Results  Component Value Date   WBC 10.3 10/01/2015   HGB 13.6 10/01/2015   HCT 40.0 10/01/2015   MCV 86.3 10/01/2015   PLT 206 10/01/2015   Basic Metabolic Panel Lab Results  Component Value Date   CREATININE 0.92 10/01/2015   BUN 12 10/01/2015   NA 135 10/01/2015   K 4.0 10/01/2015   CL 104 10/01/2015   CO2 26 10/01/2015   Liver Function Tests Lab Results  Component Value Date   ALT 43 10/01/2015   AST 133* 10/01/2015    ALKPHOS 66 10/01/2015   BILITOT 0.9 10/01/2015   Cardiac Enzymes Lab Results  Component Value Date   TROPONINI 39.80* 09/30/2015   Fasting Lipid Panel Lab Results  Component Value Date   CHOL 214* 10/01/2015   HDL 41 10/01/2015   LDLCALC 126* 10/01/2015   TRIG 234* 10/01/2015   CHOLHDL 5.2 10/01/2015    Disposition   Pt is being discharged home on 3/11 in good condition.  Follow-up Plans & Appointments    Follow-up Information    Follow up with Lorine BearsMuhammad Arida, MD On 10/11/2015.   Specialty:  Cardiology   Why:  3:15 PM   Contact information:   [address] [place] [address] [phone]  Discharge Instructions    AMB Referral to Cardiac Rehabilitation - Phase II    Complete by:  As directed   Diagnosis:   Myocardial Infarction PCI             Discharge Medications   Discharge Medication List as of 10/02/2015 12:37 PM    CONTINUE these medications which have NOT CHANGED   Details  aspirin 81 MG chewable tablet Chew 1 tablet (81 mg total) by mouth daily., Starting 10/02/2015, Until Discontinued, No Print    atorvastatin (LIPITOR) 80 MG tablet Take 1 tablet (80 mg total) by mouth daily at 6 PM., Starting 10/02/2015, Until Discontinued, Normal    carvedilol (COREG) 3.125 MG tablet Take 1 tablet (3.125 mg total) by mouth 2 (two) times daily with a meal., Starting 10/02/2015, Until Discontinued, Normal    lisinopril (PRINIVIL,ZESTRIL) 5 MG tablet Take 1 tablet (5 mg total) by mouth daily., Starting 10/02/2015, Until Discontinued, Normal    metFORMIN (GLUCOPHAGE) 500 MG tablet Take 1 tablet (500 mg total) by mouth 2 (two) times daily with a meal., Starting 10/02/2015, Until Discontinued, Normal    nitroGLYCERIN (NITROSTAT) 0.4 MG SL tablet Place 1 tablet (0.4 mg total) under the tongue every 5 (five) minutes as needed for chest pain., Starting 10/02/2015, Until Discontinued, Normal    ticagrelor (BRILINTA) 90 MG TABS tablet Take 1 tablet (90 mg  total) by mouth 2 (two) times daily., Starting 10/02/2015, Until Discontinued, Normal        Aspirin prescribed at discharge?  Yes High Intensity Statin Prescribed? (Lipitor 40-80mg  or Crestor 20-40mg ): Yes Beta Blocker Prescribed? Yes For EF 45% or less, Was ACEI/ARB Prescribed? Yes ADP Receptor Inhibitor Prescribed? (i.e. Plavix etc.-Includes Medically Managed Patients): Yes For EF <40%, Aldosterone Inhibitor Prescribed? No: Relative hypotension - will try to add as outpt. Was EF assessed during THIS hospitalization? Yes Was Cardiac Rehab II ordered? (Included Medically managed Patients): Yes   Outstanding Labs/Studies   F/U Lipids/Lft's in 6-8 wks (new statin).  Duration of Discharge Encounter   Greater than 30 minutes including physician time.  Signed, Nicolasa Ducking NP 10/04/2015, 9:09 AM

## 2015-10-02 ENCOUNTER — Other Ambulatory Visit: Payer: Self-pay | Admitting: Cardiovascular Disease

## 2015-10-02 DIAGNOSIS — I213 ST elevation (STEMI) myocardial infarction of unspecified site: Secondary | ICD-10-CM

## 2015-10-02 DIAGNOSIS — E118 Type 2 diabetes mellitus with unspecified complications: Secondary | ICD-10-CM

## 2015-10-02 DIAGNOSIS — I2102 ST elevation (STEMI) myocardial infarction involving left anterior descending coronary artery: Principal | ICD-10-CM

## 2015-10-02 DIAGNOSIS — Z720 Tobacco use: Secondary | ICD-10-CM

## 2015-10-02 DIAGNOSIS — I2510 Atherosclerotic heart disease of native coronary artery without angina pectoris: Secondary | ICD-10-CM

## 2015-10-02 DIAGNOSIS — I255 Ischemic cardiomyopathy: Secondary | ICD-10-CM

## 2015-10-02 LAB — GLUCOSE, CAPILLARY
Glucose-Capillary: 191 mg/dL — ABNORMAL HIGH (ref 65–99)
Glucose-Capillary: 149 mg/dL — ABNORMAL HIGH (ref 65–99)

## 2015-10-02 MED ORDER — ATORVASTATIN CALCIUM 80 MG PO TABS: 80.0000 mg | ORAL_TABLET | Freq: Every day | ORAL | Status: DC

## 2015-10-02 MED ORDER — NITROGLYCERIN 0.4 MG SL SUBL: 0.4000 mg | SUBLINGUAL_TABLET | SUBLINGUAL | Status: DC | PRN

## 2015-10-02 MED ORDER — TICAGRELOR 90 MG PO TABS: 90.0000 mg | ORAL_TABLET | Freq: Two times a day (BID) | ORAL | Status: DC

## 2015-10-02 MED ORDER — LISINOPRIL 5 MG PO TABS: 5.0000 mg | ORAL_TABLET | Freq: Every day | ORAL | Status: DC

## 2015-10-02 MED ORDER — ASPIRIN 81 MG PO CHEW: 81.0000 mg | CHEWABLE_TABLET | Freq: Every day | ORAL | Status: AC

## 2015-10-02 MED ORDER — CARVEDILOL 3.125 MG PO TABS: 3.1250 mg | ORAL_TABLET | Freq: Two times a day (BID) | ORAL | Status: DC

## 2015-10-02 MED ORDER — LIVING WELL WITH DIABETES BOOK
Freq: Once | Status: DC
  Filled 2015-10-02: qty 1

## 2015-10-02 MED ORDER — METFORMIN HCL 500 MG PO TABS: 500.0000 mg | ORAL_TABLET | Freq: Two times a day (BID) | ORAL | Status: DC

## 2015-10-02 NOTE — Progress Notes (Addendum)
Patient Name: Michael Jordan Date of Encounter: 10/02/2015  Hospital Problem List     Principal Problem:   ST elevation (STEMI) myocardial infarction involving left anterior descending coronary artery [facility]) Active Problems:   CAD (coronary artery disease)   Hyperlipidemia   Morbid obesity (HCC)   Tobacco abuse   Cardiomyopathy, ischemic    Subjective   No c/p or sob overnight.   Ambulating with no CP No SOB on exertion  Inpatient Medications    . antiseptic oral rinse  7 mL Mouth Rinse BID  . aspirin  81 mg Oral Daily  . atorvastatin  80 mg Oral q1800  . carvedilol  3.125 mg Oral BID WC  . insulin aspart  0-15 Units Subcutaneous TID WC  . lisinopril  5 mg Oral Daily  . sodium chloride flush  3 mL Intravenous Q12H  . ticagrelor  90 mg Oral BID    Vital Signs    Filed Vitals:   10/01/15 1127 10/01/15 1923 10/02/15 0625 10/02/15 0735  BP: 106/62 118/55 125/50 117/62  Pulse: 68 79 73 72  Temp: 98.2 F (36.8 C) 98.2 F (36.8 C) 98.4 F (36.9 C)   TempSrc: Oral Oral Oral   Resp: Height:      Weight:      SpO2: 98% 95% 95% 92%    Intake/Output Summary (Last 24 hours) at 10/02/15 1021 Last data filed at 10/02/15 0830  Gross per 24 hour  Intake    960 ml  Output   1325 ml  Net   -365 ml   Filed Weights   09/30/15 0807 09/30/15 1100  Weight: 300 lb (136.079 kg) 315 lb 14.7 oz (143.3 kg)    Physical Exam    General: Pleasant, NAD. Neuro: Alert and oriented X 3. Moves all extremities spontaneously. Psych: Normal affect. HEENT:  Normal  Neck: Supple without bruits.  Difficult to gauge JVP Lungs:  Resp regular and unlabored, CTA. Heart: RRR no s3, s4, or murmurs. Abdomen: Soft, non-tender, non-distended, BS +   Extremities: No clubbing, cyanosis or edema. DP/PT/Radials 2+ and equal bilaterally.   Labs    CBC  Recent Labs  09/30/15 0826 10/01/15 0344  WBC 9.1 10.3  HGB 14.4 13.6  HCT 42.5 40.0  MCV 86.5 86.3  PLT 224 206   Basic  Metabolic Panel  Recent Labs  09/30/15 0826 10/01/15 0344  NA 135 135  K 3.8 4.0  CL 102 104  CO2 24 26  GLUCOSE 278* 156*  BUN 19 12  CREATININE 1.05 0.92  CALCIUM 8.8* 8.3*   Liver Function Tests  Recent Labs  10/01/15 0344  AST 133*  ALT 43  ALKPHOS 66  BILITOT 0.9  PROT 6.1*  ALBUMIN 3.5   Cardiac Enzymes  Recent Labs  09/30/15 0826 09/30/15 1131  TROPONINI 0.06* 39.80*    Fasting Lipid Panel  Recent Labs  10/01/15 0344  CHOL 214*  HDL 41  LDLCALC 126*  TRIG 234*  CHOLHDL 5.2    Telemetry    RSR, Sinus tach.  ECG    RSR, 62, LAE, antsept infarct, no acute ST/T changes.  2D Echocardiogram 3.9.2017  Study Conclusions   - Procedure narrative: Transthoracic echocardiography. The study   was technically difficult. Intravenous contrast (Definity) was   administered. - Left ventricle: The cavity size was normal. Systolic function was   moderately reduced. The estimated ejection fraction was in the   range of 35% to 40%.  Severe hypokinesis of the apical and   periapical myocardium, including anteroapical wall. Mild to   moderate hypokinesis of the anterior and inferior myocardium.   Doppler parameters are consistent with abnormal left ventricular   relaxation (grade 1 diastolic dysfunction). - Left atrium: The atrium was normal in size. - Right ventricle: Systolic function was normal. - Pulmonary arteries: Systolic pressure was within the normal   range.   Radiology    No results found.  Assessment & Plan    1.  Acute Anterior STEMI/CAD:    anterior ST elevation, occluded RCA and LAD by cath  successfully treated with a DES.   Med Rx for CTO RCA.   Hemodynamically stable.   ----Cont  blocker, acei, asa, brilinta, and high potency statin.   Consider D/C home today  2.  ICM:   EF 35-40% with sev apical, periapical, anteroapical HK and mild ant/inf HK.  A --Cont  blocker and acei  3.  HL:   LDL 126.   Lipitor 80 started 3/9.     4.  Tob Abuse:   He quit in November 2016.  Continued avoidance encouraged.  5.  Morbid Obesity:   Cardiac rehab to see.   Will benefit from both rehab and aggressive nutritional counseling as an outpt.   needs sleep study.  6.  Hyperglycemia:   Gluc this AM is elevated  HGB A1C 8.0 Needs medication, possibly metformin 500 mg po BID follow up with PMD  Long discussion concerning his diabetes, diet, recovery, rehab, restrictions Ansered all questions from patient and wife   Total encounter time more than 35 minutes  Greater than 50% was spent in counseling and coordination of care with the patient   Signed, Dossie Arbourim Gwynne Kemnitz, MD, Ph.D [facility]

## 2015-10-02 NOTE — Progress Notes (Signed)
Patient rested quietly tonight with no complaints of pain. A&Ox4, VSS, and NSR on tele. Wife at bedside. Nursing staff will continue to monitor. Lamonte RicherKara A Shriley Joffe, RN

## 2015-10-02 NOTE — Progress Notes (Signed)
Discharge instructions explained to pt and pts wife/ verbalized an understanding/ iv and tele removed/ will transport off  unit via wheelchair.  

## 2015-10-02 NOTE — Discharge Summary (Signed)
  Recommended Mr. Michael Jordan start metformin 500 mg twice a day given his hemoglobin A1c of 8 Dietary changes discussed, recommended low carbohydrate diet He'll be seen by Dr. Kirke CorinArida in follow-up. Recommended he reestablish care with Dr. Maryellen PileEason  Signed, Dossie Arbourim Gollan, MD, Ph.D [facility]

## 2015-10-04 ENCOUNTER — Telehealth: Payer: Self-pay | Admitting: Cardiovascular Disease

## 2015-10-04 NOTE — Telephone Encounter (Addendum)
Attempted TCM call. Pt has appt w/Dr. Kirke CorinArida March 20, 3:15pm Left message on home VM to call back.

## 2015-10-06 NOTE — Telephone Encounter (Signed)
Attempted TCM call.  March 20 appt LM on home VM requesting call back.

## 2015-10-07 NOTE — Telephone Encounter (Signed)
Attempted TCM call.  Left message on pt cell phone to contact the office regarding March 20 appt. Called work number which indicates this number belongs to a gentleman "EthelWayne". Did not leave message on this number.

## 2015-10-11 ENCOUNTER — Telehealth: Payer: Self-pay | Admitting: Cardiovascular Disease

## 2015-10-11 ENCOUNTER — Encounter: Payer: Self-pay | Admitting: Cardiovascular Disease

## 2015-10-11 ENCOUNTER — Ambulatory Visit (INDEPENDENT_AMBULATORY_CARE_PROVIDER_SITE_OTHER): Payer: 59 | Admitting: Cardiovascular Disease

## 2015-10-11 VITALS — BP 120/80 | HR 86 | Ht 71.0 in | Wt 306.0 lb

## 2015-10-11 DIAGNOSIS — I255 Ischemic cardiomyopathy: Secondary | ICD-10-CM | POA: Diagnosis not present

## 2015-10-11 DIAGNOSIS — I2102 ST elevation (STEMI) myocardial infarction involving left anterior descending coronary artery: Secondary | ICD-10-CM | POA: Diagnosis not present

## 2015-10-11 DIAGNOSIS — I2510 Atherosclerotic heart disease of native coronary artery without angina pectoris: Secondary | ICD-10-CM

## 2015-10-11 NOTE — Progress Notes (Signed)
Cardiology Office Note   Date:  10/11/2015   ID:  Michael Jordan, DOB 1956-02-03, MRN [id number]  PCP:  Derwood Kaplan, MD  Cardiologist:   Lorine Bears, MD   Chief Complaint  Patient presents with  . other    Follow up from [facility]; cardiac cath, s/p stent placement. Meds reviewed by the patient verbally. "doing well."       History of Present Illness: Michael Jordan is a 60 y.o. male who presents for A follow-up visit after recent hospitalization for anterior ST elevation myocardial infarction. He presented with acute onset chest pain and was found to have anterior ST elevation on his EKG this month at [facility]. He underwent emergent cardiac catheterization which showed occluded proximal LAD with mild disease affecting the diagonals, chronically occluded right coronary artery with left to right collaterals and moderate left circumflex disease. He underwent successful angioplasty and drug-eluting stent placement in the proximal LAD. Ejection fraction was 40-45% by LV gram and 35-40% by echocardiogram. He did very well and was discharged on medical therapy. He has other chronic medical conditions that include morbid obesity, hypertension, hyperlipidemia and recent diagnosis of type 2 diabetes. He quit smoking in November 2016. Since hospital discharge, he has not had any substernal chest tightness. He does complain of some twinges and exertional dyspnea. No orthopnea, PND or leg edema. 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 He has been improving his diet significantly with the help with his wife. He reports taking all his medications with no side effects.   Past Medical History  Diagnosis Date  . CAD (coronary artery disease)     a. 09/2015 Ant STEMI/PCI: LM nl, LAD 100p (3.0x18 Xience Alpine DES), D1 40, SP1 40ost, D3 min irregs, LCX 47m, OM1 nl, OM3 min irregs, RCA 80p/191m (CTO)->fills via collats, EF 45-50%.  . Cardiomyopathy, ischemic     a. 09/2015 Echo:  EF 35-40%, sev apical, periapical, antapical HK, mild to mod ant, inf HK, Gr1 DD.  . Morbid obesity (HCC)   . Tobacco abuse     a. Quit 05/2015.  Marland Kitchen Hyperlipidemia     Past Surgical History  Procedure Laterality Date  . Tonsillectomy    . Cardiac catheterization N/A 09/30/2015    Procedure: Left Heart Cath and Coronary Angiography;  Surgeon: Iran Ouch, MD;  Location: [facility] INVASIVE CV LAB;  Service: Cardiovascular;  Laterality: N/A;  . Cardiac catheterization N/A 09/30/2015    Procedure: Coronary Stent Intervention;  Surgeon: Iran Ouch, MD;  Location: [facility] INVASIVE CV LAB;  Service: Cardiovascular;  Laterality: N/A;     Current Outpatient Prescriptions  Medication Sig Dispense Refill  . aspirin 81 MG chewable tablet Chew 1 tablet (81 mg total) by mouth daily.    Marland Kitchen atorvastatin (LIPITOR) 80 MG tablet Take 1 tablet (80 mg total) by mouth daily at 6 PM. 30 tablet 6  . carvedilol (COREG) 3.125 MG tablet Take 1 tablet (3.125 mg total) by mouth 2 (two) times daily with a meal. 60 tablet 6  . lisinopril (PRINIVIL,ZESTRIL) 5 MG tablet Take 1 tablet (5 mg total) by mouth daily. 30 tablet 6  . metFORMIN (GLUCOPHAGE) 500 MG tablet Take 1 tablet (500 mg total) by mouth 2 (two) times daily with a meal. 60 tablet 3  . nitroGLYCERIN (NITROSTAT) 0.4 MG SL tablet Place 1 tablet (0.4 mg total) under the tongue every 5 (five) minutes as needed for chest  pain. 25 tablet 3  . ticagrelor (BRILINTA) 90 MG TABS tablet Take 1 tablet (90 mg total) by mouth 2 (two) times daily. 60 tablet 6   No current facility-administered medications for this visit.    Allergies:   Review of patient's allergies indicates no known allergies.    Social History:  The patient  reports that he quit smoking about 4 months ago. His smoking use included Cigarettes. He has a 22 pack-year smoking history. He does not have any smokeless tobacco history on file. He reports that he drinks alcohol. He reports that he does not use  illicit drugs.   Family History:  The patient's family history includes Diabetes in his mother.    ROS:  Please see the history of present illness.   Otherwise, review of systems are positive for none.   All other systems are reviewed and negative.    PHYSICAL EXAM: VS:  BP 120/80 mmHg  Pulse 86  Ht  (1.803 m)  Wt 306 lb (138.801 kg)  BMI 42.70 kg/m2 , BMI Body mass index is 42.7 kg/(m^2). GEN: Well nourished, well developed, in no acute distress HEENT: normal Neck: no JVD, carotid bruits, or masses Cardiac: RRR; no murmurs, rubs, or gallops,no edema  Respiratory:  clear to auscultation bilaterally, normal work of breathing GI: soft, nontender, nondistended, + BS MS: no deformity or atrophy Skin: warm and dry, no rash Neuro:  Strength and sensation are intact Psych: euthymic mood, full affect Right radial pulse is normal with no hematoma.  EKG:  EKG is ordered today. The ekg ordered today demonstrates normal sinus rhythm with nonspecific anterior T wave changes.   Recent Labs: 10/01/2015: ALT 43; BUN 12; Creatinine, Ser 0.92; Hemoglobin 13.6; Platelets 206; Potassium 4.0; Sodium 135    Lipid Panel    Component Value Date/Time   CHOL 214* 10/01/2015 0344   TRIG 234* 10/01/2015 0344   HDL 41 10/01/2015 0344   CHOLHDL 5.2 10/01/2015 0344   VLDL 47* 10/01/2015 0344   LDLCALC 126* 10/01/2015 0344      Wt Readings from Last 3 Encounters:  10/11/15 306 lb (138.801 kg)  09/30/15 315 lb 14.7 oz (143.3 kg)  10/22/14 286 lb (129.729 kg)        ASSESSMENT AND PLAN:  1.  Status post anterior ST elevation myocardial infarction: He is doing very well overall with no evidence of recurrent angina. He is on optimal medical therapy including dual antiplatelet therapy. I advised him to attend cardiac rehabilitation and he is going to call them in September and appointment. In terms of going back to work, he will need to pass a stress test after one month of his myocardial  infarction with an ejection fraction about 40%. This is required given that he is a Naval architect. I had a prolonged discussion with the patient and his wife about healthy diet and regular exercise. He does have chronically occluded right coronary artery but the plan is to treat that medically for now unless he has refractory angina.  2. Hyperlipidemia: His LDL was 126 with a triglyceride of 234. Continue high dose atorvastatin. I requested fasting lipid and liver profile.  3. Ischemic cardiomyopathy: Ejection fraction was mildly reduced at 40%. Continue small dose carvedilol and lisinopril. There is no evidence of volume overload.  4. Essential hypertension: Blood pressure is controlled on current medications.  5. Morbid obesity: We discussed the importance of diet and exercise.   6. Type 2 diabetes: Currently on small dose  metformin. He was referred to a new primary care per his request.      Disposition:   FU with me in 3 months  Signed,  Lorine BearsMuhammad Marche Hottenstein, MD  10/11/2015 4:19 PM    [facility]

## 2015-10-11 NOTE — Telephone Encounter (Signed)
Per MD, pt needs to establish care w/PCP Scheduled appt w/Dr. Birdie SonsSonnenberg at [facility] Wednesday, March 22, 9:30am Left detailed message on pt VM including [place] information.

## 2015-10-11 NOTE — Telephone Encounter (Signed)
S/w pt regarding today's 3:15pm appt w/Dr. Kirke CorinArida. Pt states he spoke w/ "someone" to confirm appt. He is agreeable w/time and states he will bring his wife.

## 2015-10-11 NOTE — Patient Instructions (Addendum)
Medication Instructions:  Your physician recommends that you continue on your current medications as directed. Please refer to the Current Medication list given to you today.   Labwork: Fasting lipid and liver profile. Nothing to eat or drink the evening before your labs.  Testing/Procedures: Your physician has requested that you have a lexiscan myoview. For further information please visit [email or web address]. Please follow instruction sheet, as given.  [facility] MYOVIEW  Your caregiver has ordered a Stress Test with nuclear imaging. The purpose of this test is to evaluate the blood supply to your heart muscle. This procedure is referred to as a "Non-Invasive Stress Test." This is because other than having an IV started in your vein, nothing is inserted or "invades" your body. Cardiac stress tests are done to find areas of poor blood flow to the heart by determining the extent of coronary artery disease (CAD). Some patients exercise on a treadmill, which naturally increases the blood flow to your heart, while others who are  unable to walk on a treadmill due to physical limitations have a pharmacologic/chemical stress agent called Lexiscan . This medicine will mimic walking on a treadmill by temporarily increasing your coronary blood flow.   Please note: these test may take anywhere between 2-4 hours to complete  [facility]  THE VOLUNTEERS AT THE FIRST DESK WILL DIRECT YOU WHERE TO GO  Date of Procedure:____Monday and Tuesday, April 10 and 11______  Arrival Time for Procedure:____8:45am______  Instructions regarding medication:   __xx__ : Hold diabetes medication morning of procedure  __xx__:  Hold coreg night before procedure and morning of procedure    PLEASE NOTIFY THE OFFICE AT LEAST 24 HOURS IN ADVANCE IF YOU ARE UNABLE TO KEEP YOUR APPOINTMENT.  [phone] AND  PLEASE NOTIFY NUCLEAR MEDICINE AT [facility] AT LEAST 24 HOURS IN ADVANCE IF YOU ARE UNABLE  TO KEEP YOUR APPOINTMENT. [phone]  How to prepare for your Myoview test:   Do not eat or drink after midnight  No caffeine for 24 hours prior to test  No smoking 24 hours prior to test.  Your medication may be taken with water.  If your doctor stopped a medication because of this test, do not take that medication.  Ladies, please do not wear dresses.  Skirts or pants are appropriate. Please wear a short sleeve shirt.  No perfume, cologne or lotion.  Wear comfortable walking shoes. No heels!            Follow-Up: Your physician recommends that you schedule a follow-up appointment in: 3 months with Dr. Kirke CorinArida.    Any Other Special Instructions Will Be Listed Below (If Applicable).     If you need a refill on your cardiac medications before your next appointment, please call your pharmacy.  Cardiac Nuclear Scanning A cardiac nuclear scan is used to check your heart for problems, such as the following:  A portion of the heart is not getting enough blood.  Part of the heart muscle has died, which happens with a heart attack.  The heart wall is not working normally.  In this test, a radioactive dye (tracer) is injected into your bloodstream. After the tracer has traveled to your heart, a scanning device is used to measure how much of the tracer is absorbed by or distributed to various areas of your heart. LET [facility] KNOW ABOUT:  Any allergies you have.  All medicines you are taking, including vitamins, herbs, eye drops, creams, and over-the-counter  medicines.  Previous problems you or members of your family have had with the use of anesthetics.  Any blood disorders you have.  Previous surgeries you have had.  Medical conditions you have.  RISKS AND COMPLICATIONS Generally, this is a safe procedure. However, as with any procedure, problems can occur. Possible problems include:   Serious chest pain.  Rapid heartbeat.  Sensation of  warmth in your chest. This usually passes quickly. BEFORE THE PROCEDURE Ask your health care provider about changing or stopping your regular medicines. PROCEDURE This procedure is usually done at a hospital and takes 2-4 hours.  An IV tube is inserted into one of your veins.  Your health care provider will inject a small amount of radioactive tracer through the tube.  You will then wait for 20-40 minutes while the tracer travels through your bloodstream.  You will lie down on an exam table so images of your heart can be taken. Images will be taken for about 15-20 minutes.  You will exercise on a treadmill or stationary bike. While you exercise, your heart activity will be monitored with an electrocardiogram (ECG), and your blood pressure will be checked.  If you are unable to exercise, you may be given a medicine to make your heart beat faster.  When blood flow to your heart has peaked, tracer will again be injected through the IV tube.  After 20-40 minutes, you will get back on the exam table and have more images taken of your heart.  When the procedure is over, your IV tube will be removed. AFTER THE PROCEDURE  You will likely be able to leave shortly after the test. Unless your health care provider tells you otherwise, you may return to your normal schedule, including diet, activities, and medicines.  Make sure you find out how and when you will get your test results.   This information is not intended to replace advice given to you by your health care provider. Make sure you discuss any questions you have with your health care provider.   Document Released: 08/04/2004 Document Revised: 07/15/2013 Document Reviewed: 06/18/2013 Elsevier Interactive Patient Education [employer]

## 2015-10-13 ENCOUNTER — Encounter: Payer: Self-pay | Admitting: Family Medicine

## 2015-10-13 ENCOUNTER — Ambulatory Visit (INDEPENDENT_AMBULATORY_CARE_PROVIDER_SITE_OTHER): Payer: 59 | Admitting: Family Medicine

## 2015-10-13 VITALS — BP 124/76 | HR 81 | Temp 98.1°F | Ht 70.0 in | Wt 306.6 lb

## 2015-10-13 DIAGNOSIS — E118 Type 2 diabetes mellitus with unspecified complications: Secondary | ICD-10-CM

## 2015-10-13 DIAGNOSIS — I2102 ST elevation (STEMI) myocardial infarction involving left anterior descending coronary artery: Secondary | ICD-10-CM | POA: Diagnosis not present

## 2015-10-13 DIAGNOSIS — E785 Hyperlipidemia, unspecified: Secondary | ICD-10-CM

## 2015-10-13 MED ORDER — METFORMIN HCL 1000 MG PO TABS: 1000.0000 mg | ORAL_TABLET | Freq: Two times a day (BID) | ORAL | Status: DC

## 2015-10-13 NOTE — Assessment & Plan Note (Signed)
Tolerating medication. Has limited panel and hepatic function panel ordered for a couple weeks from now.

## 2015-10-13 NOTE — Progress Notes (Signed)
Patient ID: SHADE KALEY, male   DOB: 1955/08/19, 60 y.o.   MRN: [id number]  Michael Alar, MD Phone: [phone]  Michael Jordan is a 60 y.o. male who presents today for new patient visit.  STEMI: Patient recently underwent cardiac catheterization emergently with stent placement for MI. Notes he has done well since discharge. Has followed up with cardiology 2 days ago. No substernal chest pain. No shortness of breath. No recurrence of his symptoms. Minimal twinges infrequently if he overexerts himself which he has discussed with cardiology. He is currently Lipitor, aspirin, brilanta, lisinopril, and Coreg. Has not had to take any nitroglycerin. He has noted some mild bruising on dual antiplatelet therapy.  DIABETES Disease Monitoring: Blood Sugar ranges-not checking, patient is only on metformin Polyuria/phagia/dipsia- no      Medications: Compliance- taking metformin 500 mg twice daily Hypoglycemic symptoms- no  HYPERLIPIDEMIA Symptoms Chest pain on exertion:  No   Leg claudication:   No Medications: Compliance- taking Lipitor Right upper quadrant pain- no  Muscle aches- no  Active Ambulatory Problems    Diagnosis Date Noted  . ST elevation myocardial infarction (STEMI) (HCC)   . ST elevation (STEMI) myocardial infarction involving left anterior descending coronary artery (HCC) 09/30/2015  . CAD (coronary artery disease) 10/01/2015  . Hyperlipidemia 10/01/2015  . Morbid obesity (HCC) 10/01/2015  . Tobacco abuse 10/01/2015  . Cardiomyopathy, ischemic 10/01/2015  . Type 2 diabetes mellitus with complication, without long-term current use of insulin (HCC)    Resolved Ambulatory Problems    Diagnosis Date Noted  . No Resolved Ambulatory Problems   Past Medical History  Diagnosis Date  . Diabetes (HCC)     Family History  Problem Relation Age of Onset  . Diabetes Mother   . Heart attack Mother   . Heart attack Father   . Heart attack Paternal Grandfather     Social  History   Social History  . Marital Status: Married    Spouse Name: N/A  . Number of Children: N/A  . Years of Education: N/A   Occupational History  . Not on file.   Social History Main Topics  . Smoking status: Former Smoker -- 0.50 packs/day for 44 years    Types: Cigarettes    Quit date: 06/02/2015  . Smokeless tobacco: Not on file  . Alcohol Use: 0.0 oz/week    0 Standard drinks or equivalent per week  . Drug Use: No  . Sexual Activity: Not on file   Other Topics Concern  . Not on file   Social History Narrative    ROS   General:  Negative for nexplained weight loss, fever Skin: Negative for new or changing mole, sore that won't heal HEENT: Negative for trouble hearing, trouble seeing, ringing in ears, mouth sores, hoarseness, change in voice, dysphagia. CV:  Negative for chest pain, dyspnea, edema, palpitations Resp: Negative for cough, dyspnea, hemoptysis GI: Negative for nausea, vomiting, diarrhea, constipation, abdominal pain, melena, hematochezia. GU: Negative for dysuria, incontinence, urinary hesitance, hematuria, vaginal or penile discharge, polyuria, sexual difficulty, lumps in testicle or breasts MSK: Negative for muscle cramps or aches, joint pain or swelling Neuro: Negative for headaches, weakness, numbness, dizziness, passing out/fainting Psych: Negative for depression, anxiety, memory problems  Objective  Physical Exam Filed Vitals:   10/13/15 0932  BP: 124/76  Pulse: 81  Temp: 98.1 F (36.7 C)    BP Readings from Last 3 Encounters:  10/13/15 124/76  10/11/15 120/80  10/02/15 123/57   Wt  Readings from Last 3 Encounters:  10/13/15 306 lb 9.6 oz (139.073 kg)  10/11/15 306 lb (138.801 kg)  09/30/15 315 lb 14.7 oz (143.3 kg)    Physical Exam  Constitutional: He is well-developed, well-nourished, and in no distress.  HENT:  Head: Normocephalic and atraumatic.  Right Ear: External ear normal.  Left Ear: External ear normal.    Mouth/Throat: Oropharynx is clear and moist. No oropharyngeal exudate.  Eyes: Conjunctivae are normal. Pupils are equal, round, and reactive to light.  Neck: Neck supple.  Cardiovascular: Normal rate, regular rhythm and normal heart sounds.   2+ radial pulses  Pulmonary/Chest: Effort normal and breath sounds normal.  Abdominal: Soft. Bowel sounds are normal. He exhibits no distension. There is no tenderness. There is no rebound and no guarding.  Musculoskeletal: He exhibits no edema.  Lymphadenopathy:    He has no cervical adenopathy.  Neurological: He is alert. Gait normal.  Skin: Skin is warm and dry. He is not diaphoretic.  Mild bruise posterior right leg, mild bruise right upper extremity distally  Psychiatric: Mood and affect normal.     Assessment/Plan:   ST elevation myocardial infarction (STEMI) [facility](HCC) Patient is doing well status post stent placement for MI. He is tolerating his medications well. 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 He will have follow-up on his lipids and hepatic function at that time. Is following up with cardiology as well. He will continue to monitor. He is given return precautions.  Type 2 diabetes mellitus with complication, without long-term current use of insulin (HCC) Tolerating metformin well. We will have him increase to 1000 mg in the morning and 500 mg at night for the next week and then increase to 1000 mg twice daily for goal therapy. He will continue to monitor.  Hyperlipidemia Tolerating medication. Has limited panel and hepatic function panel ordered for a couple weeks from now.    No orders of the defined types were placed in this encounter.    Meds ordered this encounter  Medications  . metFORMIN (GLUCOPHAGE) 1000 MG tablet    Sig: Take 1 tablet (1,000 mg total) by mouth 2 (two) times daily with a meal.    Dispense:  180 tablet    Refill:  3     Michael AlarEric  Helmi Hechavarria, MD [facility]

## 2015-10-13 NOTE — Patient Instructions (Addendum)
Nice to meet you. Please continue to monitor for recurrence of chest pain. We will increase her metformin to 1000 mg in the morning and 500 mg at night for 7 days and then increase to 1000 mg twice daily if tolerated. If you develop chest pain, shortness of breath, palpitations, swelling in her legs, abdominal discomfort, diarrhea, or any new or change in symptoms please seek medical attention.  Diet Recommendations  Starchy (carb) foods: Bread, rice, pasta, potatoes, corn, cereal, grits, crackers, bagels, muffins, all baked goods.  (Fruits, milk, and yogurt also have carbohydrate, but most of these foods will not spike your blood sugar as the starchy foods will.)  A few fruits do cause high blood sugars; use small portions of bananas (limit to 1/2 at a time), grapes, watermelon, oranges, and most tropical fruits.    Protein foods: Meat, fish, poultry, eggs, dairy foods, and beans such as pinto and kidney beans (beans also provide carbohydrate).   1. Eat at least 3 meals and 1-2 snacks per day. Never go more than 4-5 hours while awake without eating. Eat breakfast within the first hour of getting up.   2. Limit starchy foods to TWO per meal and ONE per snack. ONE portion of a starchy  food is equal to the following:   - ONE slice of bread (or its equivalent, such as half of a hamburger bun).   - 1/2 cup of a "scoopable" starchy food such as potatoes or rice.   - 15 grams of carbohydrate as shown on food label.  3. Include at every meal: a protein food, a carb food, and vegetables and/or fruit.   - Obtain twice the volume of veg's as protein or carbohydrate foods for both lunch and dinner.   - Fresh or frozen veg's are best.   - Keep frozen veg's on hand for a quick vegetable serving.

## 2015-10-13 NOTE — Assessment & Plan Note (Signed)
Patient is doing well status post stent placement for MI. He is tolerating his medications well. 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 He will have follow-up on his lipids and hepatic function at that time. Is following up with cardiology as well. He will continue to monitor. He is given return precautions.

## 2015-10-13 NOTE — Progress Notes (Signed)
Pre visit review using our clinic review tool, if applicable. No additional management support is needed unless otherwise documented below in the visit note. 

## 2015-10-13 NOTE — Assessment & Plan Note (Signed)
Tolerating metformin well. We will have him increase to 1000 mg in the morning and 500 mg at night for the next week and then increase to 1000 mg twice daily for goal therapy. He will continue to monitor.

## 2015-10-20 ENCOUNTER — Encounter: Payer: Self-pay | Admitting: *Deleted

## 2015-10-20 ENCOUNTER — Encounter: Payer: 59 | Attending: Cardiovascular Disease | Admitting: *Deleted

## 2015-10-20 ENCOUNTER — Observation Stay
Admission: EM | Admit: 2015-10-20 | Discharge: 2015-10-21 | Disposition: A | Discharge status: Home / Self Care | Payer: 59 | Attending: Internal Medicine | Admitting: Internal Medicine

## 2015-10-20 ENCOUNTER — Other Ambulatory Visit: Payer: Self-pay

## 2015-10-20 ENCOUNTER — Emergency Department: Payer: 59

## 2015-10-20 ENCOUNTER — Telehealth: Payer: Self-pay | Admitting: Cardiovascular Disease

## 2015-10-20 VITALS — Ht 70.0 in | Wt 302.7 lb

## 2015-10-20 DIAGNOSIS — R202 Paresthesia of skin: Secondary | ICD-10-CM | POA: Insufficient documentation

## 2015-10-20 DIAGNOSIS — Z833 Family history of diabetes mellitus: Secondary | ICD-10-CM | POA: Insufficient documentation

## 2015-10-20 DIAGNOSIS — Z87891 Personal history of nicotine dependence: Secondary | ICD-10-CM | POA: Diagnosis not present

## 2015-10-20 DIAGNOSIS — R200 Anesthesia of skin: Secondary | ICD-10-CM | POA: Insufficient documentation

## 2015-10-20 DIAGNOSIS — E785 Hyperlipidemia, unspecified: Secondary | ICD-10-CM | POA: Insufficient documentation

## 2015-10-20 DIAGNOSIS — E1165 Type 2 diabetes mellitus with hyperglycemia: Secondary | ICD-10-CM | POA: Diagnosis not present

## 2015-10-20 DIAGNOSIS — R0602 Shortness of breath: Secondary | ICD-10-CM | POA: Insufficient documentation

## 2015-10-20 DIAGNOSIS — Z7984 Long term (current) use of oral hypoglycemic drugs: Secondary | ICD-10-CM | POA: Insufficient documentation

## 2015-10-20 DIAGNOSIS — I255 Ischemic cardiomyopathy: Secondary | ICD-10-CM | POA: Diagnosis not present

## 2015-10-20 DIAGNOSIS — R079 Chest pain, unspecified: Principal | ICD-10-CM | POA: Diagnosis present

## 2015-10-20 DIAGNOSIS — Z79899 Other long term (current) drug therapy: Secondary | ICD-10-CM | POA: Diagnosis not present

## 2015-10-20 DIAGNOSIS — I2510 Atherosclerotic heart disease of native coronary artery without angina pectoris: Secondary | ICD-10-CM | POA: Insufficient documentation

## 2015-10-20 DIAGNOSIS — Z955 Presence of coronary angioplasty implant and graft: Secondary | ICD-10-CM

## 2015-10-20 DIAGNOSIS — I213 ST elevation (STEMI) myocardial infarction of unspecified site: Secondary | ICD-10-CM

## 2015-10-20 DIAGNOSIS — I10 Essential (primary) hypertension: Secondary | ICD-10-CM | POA: Diagnosis not present

## 2015-10-20 DIAGNOSIS — Z6841 Body Mass Index (BMI) 40.0 and over, adult: Secondary | ICD-10-CM | POA: Diagnosis not present

## 2015-10-20 DIAGNOSIS — Z7982 Long term (current) use of aspirin: Secondary | ICD-10-CM | POA: Diagnosis not present

## 2015-10-20 DIAGNOSIS — I252 Old myocardial infarction: Secondary | ICD-10-CM | POA: Diagnosis not present

## 2015-10-20 DIAGNOSIS — R61 Generalized hyperhidrosis: Secondary | ICD-10-CM | POA: Diagnosis not present

## 2015-10-20 DIAGNOSIS — Z8249 Family history of ischemic heart disease and other diseases of the circulatory system: Secondary | ICD-10-CM | POA: Diagnosis not present

## 2015-10-20 DIAGNOSIS — R6884 Jaw pain: Secondary | ICD-10-CM | POA: Insufficient documentation

## 2015-10-20 DIAGNOSIS — E118 Type 2 diabetes mellitus with unspecified complications: Secondary | ICD-10-CM | POA: Insufficient documentation

## 2015-10-20 DIAGNOSIS — E1159 Type 2 diabetes mellitus with other circulatory complications: Secondary | ICD-10-CM | POA: Insufficient documentation

## 2015-10-20 LAB — BASIC METABOLIC PANEL
Anion gap: 5 (ref 5–15)
BUN: 19 mg/dL (ref 6–20)
CO2: 23 mmol/L (ref 22–32)
Calcium: 9 mg/dL (ref 8.9–10.3)
Chloride: 107 mmol/L (ref 101–111)
Creatinine, Ser: 0.96 mg/dL (ref 0.61–1.24)
GFR calc Af Amer: >60 mL/min (ref >60)
GFR calc non Af Amer: >60 mL/min (ref >60)
Glucose, Bld: 112 mg/dL — ABNORMAL HIGH (ref 65–99)
Potassium: 4.2 mmol/L (ref 3.5–5.1)
Sodium: 135 mmol/L (ref 135–145)

## 2015-10-20 LAB — TROPONIN I
Troponin I: <0.03 ng/mL (ref <0.031)
Troponin I: <0.03 ng/mL (ref <0.031)
Troponin I: <0.03 ng/mL (ref <0.031)

## 2015-10-20 LAB — CBC
HCT: 43 % (ref 40.0–52.0)
Hemoglobin: 14.6 g/dL (ref 13.0–18.0)
MCH: 29.1 pg (ref 26.0–34.0)
MCHC: 33.8 g/dL (ref 32.0–36.0)
MCV: 85.9 fL (ref 80.0–100.0)
Platelets: 245 10*3/uL (ref 150–440)
RBC: 5.01 MIL/uL (ref 4.40–5.90)
RDW: 14.1 % (ref 11.5–14.5)
WBC: 11.9 10*3/uL — ABNORMAL HIGH (ref 3.8–10.6)

## 2015-10-20 LAB — GLUCOSE, CAPILLARY: Glucose-Capillary: 163 mg/dL — ABNORMAL HIGH (ref 65–99)

## 2015-10-20 MED ORDER — CARVEDILOL 3.125 MG PO TABS
3.1250 mg | ORAL_TABLET | Freq: Two times a day (BID) | ORAL | Status: DC
  Administered 2015-10-21: 3.125 mg via ORAL
  Filled 2015-10-20: qty 1

## 2015-10-20 MED ORDER — TICAGRELOR 90 MG PO TABS
90.0000 mg | ORAL_TABLET | Freq: Two times a day (BID) | ORAL | Status: DC
  Administered 2015-10-20 – 2015-10-21 (×2): 90 mg via ORAL
  Filled 2015-10-20 (×3): qty 1

## 2015-10-20 MED ORDER — SODIUM CHLORIDE 0.9% FLUSH
3.0000 mL | Freq: Two times a day (BID) | INTRAVENOUS | Status: DC
  Administered 2015-10-20 – 2015-10-21 (×2): 3 mL via INTRAVENOUS

## 2015-10-20 MED ORDER — ONDANSETRON HCL 4 MG/2ML IJ SOLN: 4.0000 mg | Freq: Four times a day (QID) | INTRAMUSCULAR | Status: DC | PRN

## 2015-10-20 MED ORDER — ACETAMINOPHEN 325 MG PO TABS: 650.0000 mg | ORAL_TABLET | Freq: Four times a day (QID) | ORAL | Status: DC | PRN

## 2015-10-20 MED ORDER — ASPIRIN 81 MG PO CHEW
81.0000 mg | CHEWABLE_TABLET | Freq: Every day | ORAL | Status: DC
  Administered 2015-10-21: 81 mg via ORAL
  Filled 2015-10-20: qty 1

## 2015-10-20 MED ORDER — NITROGLYCERIN 0.4 MG SL SUBL: 0.4000 mg | SUBLINGUAL_TABLET | SUBLINGUAL | Status: DC | PRN

## 2015-10-20 MED ORDER — ONDANSETRON HCL 4 MG PO TABS: 4.0000 mg | ORAL_TABLET | Freq: Four times a day (QID) | ORAL | Status: DC | PRN

## 2015-10-20 MED ORDER — MORPHINE SULFATE (PF) 2 MG/ML IV SOLN: 2.0000 mg | INTRAVENOUS | Status: DC | PRN

## 2015-10-20 MED ORDER — ATORVASTATIN CALCIUM 20 MG PO TABS: 80.0000 mg | ORAL_TABLET | Freq: Every day | ORAL | Status: DC

## 2015-10-20 MED ORDER — LISINOPRIL 5 MG PO TABS
5.0000 mg | ORAL_TABLET | Freq: Every day | ORAL | Status: DC
  Administered 2015-10-20 – 2015-10-21 (×2): 5 mg via ORAL
  Filled 2015-10-20 (×2): qty 1

## 2015-10-20 MED ORDER — CARVEDILOL 3.125 MG PO TABS
3.1250 mg | ORAL_TABLET | Freq: Once | ORAL | Status: AC
  Administered 2015-10-20: 3.125 mg via ORAL
  Filled 2015-10-20: qty 1

## 2015-10-20 MED ORDER — METFORMIN HCL 500 MG PO TABS
1000.0000 mg | ORAL_TABLET | Freq: Two times a day (BID) | ORAL | Status: DC
  Administered 2015-10-21: 1000 mg via ORAL
  Filled 2015-10-20: qty 2

## 2015-10-20 MED ORDER — ACETAMINOPHEN 650 MG RE SUPP: 650.0000 mg | Freq: Four times a day (QID) | RECTAL | Status: DC | PRN

## 2015-10-20 MED ORDER — METFORMIN HCL 500 MG PO TABS
1000.0000 mg | ORAL_TABLET | Freq: Once | ORAL | Status: AC
  Administered 2015-10-20: 1000 mg via ORAL
  Filled 2015-10-20: qty 2

## 2015-10-20 NOTE — ED Provider Notes (Signed)
[facility] Emergency Department Provider Note  ____________________________________________  Time seen: Approximately 4:44 PM  I have reviewed the triage vital signs and the nursing notes.   HISTORY  Chief Complaint Numbness    HPI Michael Jordan is a 60 y.o. male with a history of ischemic cardiomyopathy, CAD status post STEMI on 3/9 status post drug-eluting stent to the LAD on [place], presenting with bilateral hand tingling and jaw tingling. The patient reports that he has been doing his regular ADLs, but not "pushing it," since he was discharged from the hospital. Today at cardiac rehabilitation, he underwent 6 minutes as a moderate pace on the treadmill when he began to develop jaw tingling and bilateral hand tingling without any chest pain. He did have some mild shortness of breath. He stopped the treadmill and sat down and the symptoms immediately resolved. At this time, he remains asymptomatic.   Past Medical History  Diagnosis Date  . CAD (coronary artery disease)     a. 09/2015 Ant STEMI/PCI: LM nl, LAD 100p (3.0x18 Xience [place]pine DES), D1 40, SP1 40ost, D3 min irregs, LCX 7575m, OM1 nl, OM3 min irregs, RCA 80p/17529m (CTO)->fills via collats, EF 45-50%.  . Cardiomyopathy, ischemic     a. 09/2015 Echo: EF 35-40%, sev apical, periapical, antapical HK, mild to mod ant, inf HK, Gr1 DD.  . Morbid obesity (HCC)   . Tobacco abuse     a. Quit 05/2015.  Marland Kitchen. Hyperlipidemia   . Diabetes [facility](HCC)     Patient Active Problem List   Diagnosis Date Noted  . Type 2 diabetes mellitus with complication, without long-term current use of insulin (HCC)   . CAD (coronary artery disease) 10/01/2015  . Hyperlipidemia 10/01/2015  . Morbid obesity (HCC) 10/01/2015  . Tobacco abuse 10/01/2015  . Cardiomyopathy, ischemic 10/01/2015  . ST elevation (STEMI) myocardial infarction involving left anterior descending coronary artery (HCC) 09/30/2015  . ST elevation myocardial infarction  (STEMI) [facility](HCC)     Past Surgical History  Procedure Laterality Date  . Tonsillectomy    . Cardiac catheterization N/A 09/30/2015    Procedure: Left Heart Cath and Coronary Angiography;  Surgeon: Iran OuchMuhammad A Arida, MD;  Location: [facility] INVASIVE CV LAB;  Service: Cardiovascular;  Laterality: N/A;  . Cardiac catheterization N/A 09/30/2015    Procedure: Coronary Stent Intervention;  Surgeon: Iran OuchMuhammad A Arida, MD;  Location: [facility] INVASIVE CV LAB;  Service: Cardiovascular;  Laterality: N/A;    Current Outpatient Rx  Name  Route  Sig  Dispense  Refill  . aspirin 81 MG chewable tablet   Oral   Chew 1 tablet (81 mg total) by mouth daily.         Marland Kitchen. atorvastatin (LIPITOR) 80 MG tablet   Oral   Take 1 tablet (80 mg total) by mouth daily at 6 PM.   30 tablet   6   . carvedilol (COREG) 3.125 MG tablet   Oral   Take 1 tablet (3.125 mg total) by mouth 2 (two) times daily with a meal.   60 tablet   6   . lisinopril (PRINIVIL,ZESTRIL) 5 MG tablet   Oral   Take 1 tablet (5 mg total) by mouth daily.   30 tablet   6   . metFORMIN (GLUCOPHAGE) 1000 MG tablet   Oral   Take 1 tablet (1,000 mg total) by mouth 2 (two) times daily with a meal.   180 tablet   3   . nitroGLYCERIN (NITROSTAT) 0.4 MG SL tablet  Sublingual   Place 1 tablet (0.4 mg total) under the tongue every 5 (five) minutes as needed for chest pain.   25 tablet   3   . ticagrelor (BRILINTA) 90 MG TABS tablet   Oral   Take 1 tablet (90 mg total) by mouth 2 (two) times daily.   60 tablet   6     Allergies Review of patient's allergies indicates no known allergies.  Family History  Problem Relation Age of Onset  . Diabetes Mother   . Heart attack Mother   . Heart attack Father   . Heart attack Paternal Grandfather     Social History Social History  Substance Use Topics  . Smoking status: Former Smoker -- 0.50 packs/day for 44 years    Types: Cigarettes    Quit date: 06/02/2015  . Smokeless tobacco: None  .  Alcohol Use: 0.0 oz/week    0 Standard drinks or equivalent per week    Review of Systems Constitutional: No fever/chills. Eyes: No visual changes. ENT: No sore throat. No congestion or rhinorrhea.Positive jaw tingling. Cardiovascular: Denies chest pain. Denies palpitations. Respiratory: Denies shortness of breath.  No cough. Gastrointestinal: No abdominal pain.  No nausea, no vomiting.  No diarrhea.  No constipation. Genitourinary: Negative for dysuria. Musculoskeletal: Negative for back pain. Skin: Negative for rash. Neurological: Negative for headaches. No focal numbness or weakness. Bilateral hand tingling  10-point ROS otherwise negative.  ____________________________________________   PHYSICAL EXAM:  VITAL SIGNS: ED Triage Vitals  Enc Vitals Group     BP 10/20/15 1527 135/64 mmHg     Pulse Rate 10/20/15 1527 80     Resp 10/20/15 1527 18     Temp 10/20/15 1527 98.7 F (37.1 C)     Temp Source 10/20/15 1527 Oral     SpO2 10/20/15 1527 95 %     Weight 10/20/15 1527 302 lb (136.986 kg)     Height 10/20/15 1527  (1.778 m)     Head Cir --      Peak Flow --      Pain Score 10/20/15 1528 0     Pain Loc --      Pain Edu? --      Excl. in GC? --     Constitutional: Alert and oriented. Well appearing and in no acute distress. Answers questions appropriately. Eyes: Conjunctivae are normal.  EOMI. No scleral icterus. Head: Atraumatic. Nose: No congestion/rhinnorhea. Mouth/Throat: Mucous membranes are moist.  Neck: No stridor.  Supple.  No JVD. Cardiovascular: Normal rate, regular rhythm. No murmurs, rubs or gallops.  Respiratory: Normal respiratory effort.  No accessory muscle use or retractions. Lungs CTAB.  No wheezes, rales or ronchi. Gastrointestinal: Obese. Soft, nontender and nondistended.  No guarding or rebound.  No peritoneal signs. Musculoskeletal: No LE edema. No ttp in the calves or palpable cords.  Negative Homan's sign. Neurologic:  A&Ox3.  Speech is  clear.  Face and smile are symmetric.  EOMI.  Moves all extremities well. Skin:  Skin is warm, dry and intact. No rash noted. Psychiatric: Mood and affect are normal. Speech and behavior are normal.  Normal judgement.  ____________________________________________   LABS (all labs ordered are listed, but only abnormal results are displayed)  Labs Reviewed  BASIC METABOLIC PANEL - Abnormal; Notable for the following:    Glucose, Bld 112 (*)    All other components within normal limits  CBC - Abnormal; Notable for the following:    WBC 11.9 (*)  All other components within normal limits  TROPONIN I   ____________________________________________  EKG  ED ECG REPORT I, Rockne Menghini, the attending physician, personally viewed and interpreted this ECG.   Date: 10/20/2015  EKG Time: 1528  Rate: 74  Rhythm: normal sinus rhythm  Axis: Normal  Intervals:none  ST&T Change: No ST elevation. Nonspecific T-wave inversions in V1 through V4. This EKG is compared to previous on 3/16 which shows the same T-wave inversions, so these are not dynamic changes. Overall the EKG is grossly unchanged from previous.  ____________________________________________  RADIOLOGY  Dg Chest 2 View  10/20/2015  CLINICAL DATA:  Chest pain at cardiac rehab. Jaw tightness, arm tingling. EXAM: CHEST  2 VIEW COMPARISON:  08/06/2007 FINDINGS: Heart and mediastinal contours are within normal limits. No focal opacities or effusions. No acute bony abnormality. IMPRESSION: No active cardiopulmonary disease. Electronically Signed   By: Charlett Nose M.D.   On: 10/20/2015 16:14    ____________________________________________   PROCEDURES  Procedure(s) performed: None  Critical Care performed: No ____________________________________________   INITIAL IMPRESSION / ASSESSMENT AND PLAN / ED COURSE  Pertinent labs & imaging results that were available during my care of the patient were reviewed by me and  considered in my medical decision making (see chart for details).  60 y.o. male status post recent drug-eluting stent placement for LAD with STEMI, on [place] to, presenting with atypical symptoms including jaw numbness and tingling and bilateral hand tingling. The patient does not have any ischemic changes on his EKG and his labs from triage show a negative troponin. His symptoms were at 2:30 PM, so his troponin may be somewhat early. I have spoken with the cardiologist who is on-call, and we will plan to admit the patient for continued cardiac rule out.  ____________________________________________  FINAL CLINICAL IMPRESSION(S) / ED DIAGNOSES  Final diagnoses:  Paresthesia of both hands  Shortness of breath      NEW MEDICATIONS STARTED DURING THIS VISIT:  New Prescriptions   No medications on file     Rockne Menghini, MD 10/20/15 1648

## 2015-10-20 NOTE — Progress Notes (Signed)
Patient stated that he has not taken his metformin 1000mg  and coreg 3.125mg  this evening. Dr. reddy paged. New order for metformin and coreg will continue to monitor.

## 2015-10-20 NOTE — ED Notes (Signed)
States he was at cardiac rehab and after a 6 minute walk developed jaw tightness and arm tingling, states the symptoms were similar when he had his STEMI and LAD stents placed 3 weeks ago, at present pt denies any chest pain, tightness or jaw pressure, awake and alert in no acute distress

## 2015-10-20 NOTE — Patient Instructions (Signed)
Patient Instructions  Patient Details  Name: Michael RifeHugh L Podesta MRN: [id number] Date of Birth: 10-12-1955 Referring Provider:  Iran OuchArida, Muhammad A, MD  Below are the personal goals you chose as well as exercise and nutrition goals. Our goal is to help you keep on track towards obtaining and maintaining your goals. We will be discussing your progress on these goals with you throughout the program.  Initial Exercise Prescription:     Initial Exercise Prescription - 10/20/15 1600    Date of Initial Exercise Prescription   Date 10/20/15   Treadmill   MPH 2.4   Grade 0   Minutes 10   Recumbant Bike   Level 2   RPM 40   Watts 20   Minutes 15   NuStep   Level 2   Watts 30   Minutes 15   Arm Ergometer   Level 1   Watts 8   Minutes 10   Arm/Foot Ergometer   Level 4   Watts 12   Minutes 10   Recumbant Elliptical   Level 2   RPM 40   Watts 20   Minutes 15   REL-XR   Level 3   Watts 35   Minutes 15   T5 Nustep   Level 2   Watts 20   Minutes 15   Biostep-RELP   Level 3   Watts 20   Minutes 15   Prescription Details   Frequency (times per week) 3   Duration Progress to 30 minutes of continuous aerobic without signs/symptoms of physical distress   Intensity   THRR REST +  30   Ratings of Perceived Exertion 11-15   Progression   Progression Continue progressive overload as per policy without signs/symptoms or physical distress.   Resistance Training   Training Prescription Yes   Weight 2   Reps 10-15      Exercise Goals: Frequency: Be able to perform aerobic exercise three times per week working toward 3-5 days per week.  Intensity: Work with a perceived exertion of 11 (fairly light) - 15 (hard) as tolerated. Follow your new exercise prescription and watch for changes in prescription as you progress with the program. Changes will be reviewed with you when they are made.  Duration: You should be able to do 30 minutes of continuous aerobic exercise in addition to a 5  minute warm-up and a 5 minute cool-down routine.  Nutrition Goals: Your personal nutrition goals will be established when you do your nutrition analysis with the dietician.  The following are nutrition guidelines to follow: Cholesterol < 200mg /day Sodium < 1500mg /day Fiber: Men over 50 yrs - 30 grams per day  Personal Goals:     Personal Goals and Risk Factors at Admission - 10/20/15 1641    Core Components/Risk Factors/Patient Goals on Admission    Weight Management Yes;Obesity   Intervention Weight Management/Obesity: Establish reasonable short term and long term weight goals.;Obesity: Provide education and appropriate resources to help participant work on and attain dietary goals.   Admit Weight 302 lb 11.2 oz (137.304 kg)   Goal Weight: Short Term 290 lb (131.543 kg)  Lose 1 - 2 pounds per week.     Goal Weight: Long Term 200 lb (90.719 kg)   Expected Outcomes Short Term: Continue to assess and modify interventions until short term weight is achieved;Long Term: Adherence to nutrition and physical activity/exercise program aimed toward attainment of established weight goal;Weight Loss: Understanding of general recommendations for a balanced deficit meal plan,  which promotes 1-2 lb weight loss per week and includes a negative energy balance of [phone] kcal/d;Understanding recommendations for meals to include 15-35% energy as protein, 25-35% energy from fat, 35-60% energy from carbohydrates, less than  of dietary cholesterol, 20-35 gm of total fiber daily;Understanding of distribution of calorie intake throughout the day with the consumption of 4-5 meals/snacks   Sedentary Yes   Intervention Provide advice, education, support and counseling about physical activity/exercise needs.;Develop an individualized exercise prescription for aerobic and resistive training based on initial evaluation findings, risk stratification, comorbidities and participant's personal goals.   Expected Outcomes  Achievement of increased cardiorespiratory fitness and enhanced flexibility, muscular endurance and strength shown through measurements of functional capacity and personal statement of participant.   Increase Strength and Stamina Yes   Intervention Provide advice, education, support and counseling about physical activity/exercise needs.;Develop an individualized exercise prescription for aerobic and resistive training based on initial evaluation findings, risk stratification, comorbidities and participant's personal goals.   Expected Outcomes Achievement of increased cardiorespiratory fitness and enhanced flexibility, muscular endurance and strength shown through measurements of functional capacity and personal statement of participant.   Diabetes Yes   Intervention Provide education about signs/symptoms and action to take for hypo/hyperglycemia.;Provide education about proper nutrition, including hydration, and aerobic/resistive exercise prescription along with prescribed medications to achieve blood glucose in normal ranges: Fasting glucose 65-99 mg/dL   Expected Outcomes Short Term: Participant verbalizes understanding of the signs/symptoms and immediate care of hyper/hypoglycemia, proper foot care and importance of medication, aerobic/resistive exercise and nutrition plan for blood glucose control.;Long Term: Attainment of HbA1C < 7%.   Hypertension Yes   Intervention Provide education on lifestyle modifcations including regular physical activity/exercise, weight management, moderate sodium restriction and increased consumption of fresh fruit, vegetables, and low fat dairy, alcohol moderation, and smoking cessation.;Monitor prescription use compliance.   Expected Outcomes Short Term: Continued assessment and intervention until BP is < 140/14mm HG in hypertensive participants. < 130/50mm HG in hypertensive participants with diabetes, heart failure or chronic kidney disease.;Long Term: Maintenance of  blood pressure at goal levels.   Lipids Yes   Intervention Provide education and support for participant on nutrition & aerobic/resistive exercise along with prescribed medications to achieve LDL 70mg , HDL >40mg .   Expected Outcomes Short Term: Participant states understanding of desired cholesterol values and is compliant with medications prescribed. Participant is following exercise prescription and nutrition guidelines.;Long Term: Cholesterol controlled with medications as prescribed, with individualized exercise RX and with personalized nutrition plan. Value goals: LDL < , HDL > 40 mg.      Tobacco Use Initial Evaluation: History  Smoking status  . Former Smoker -- 0.50 packs/day for 44 years  . Types: Cigarettes  . Quit date: 06/02/2015  Smokeless tobacco  . Not on file    Copy of goals given to participant.

## 2015-10-20 NOTE — ED Notes (Signed)
States this is the first time he has exercised or been to cardiac rehab since the stemi

## 2015-10-20 NOTE — H&P (Signed)
[facility] - [place] at [facility]   PATIENT NAME: Michael Jordan    MR#:  [id number]  DATE OF BIRTH:  1956-05-12  DATE OF ADMISSION:  10/20/2015  PRIMARY CARE PHYSICIAN: [name], MD   REQUESTING/REFERRING PHYSICIAN: Dr. Sharma Covert  CHIEF COMPLAINT:   Chief Complaint  Patient presents with  . Numbness  Jaw numbness and bilateral arm hand tingling  HISTORY OF PRESENT ILLNESS:  Michael Jordan  is a 60 y.o. male with a known history of Coronary artery disease status post recent STEMI status post drug-eluting stent to the LAD, diabetes, obesity, history of tobacco abuse, hyperlipidemia who presents to the hospital from cardiac rehabilitation complaining of jaw numbness and arm and hand tingling while exercising on the treadmill. Patient says that he developed the jaw numbness when he developed his STEMI about 3 weeks ago. He was sent to the ER for further evaluation. Patient also complaining of some mild diaphoresis, shortness of breath but no nausea, vomiting, palpitations or any syncope. Given his recent STEMI and stent placement and ongoing symptoms hospitalist services were contacted further treatment and evaluation.  PAST MEDICAL HISTORY:   Past Medical History  Diagnosis Date  . CAD (coronary artery disease)     a. 09/2015 Ant STEMI/PCI: LM nl, LAD 100p (3.0x18 Xience Alpine DES), D1 40, SP1 40ost, D3 min irregs, LCX 49m, OM1 nl, OM3 min irregs, RCA 80p/152m (CTO)->fills via collats, EF 45-50%.  . Cardiomyopathy, ischemic     a. 09/2015 Echo: EF 35-40%, sev apical, periapical, antapical HK, mild to mod ant, inf HK, Gr1 DD.  . Morbid obesity (HCC)   . Tobacco abuse     a. Quit 05/2015.  Marland Kitchen Hyperlipidemia   . Diabetes (HCC)     PAST SURGICAL HISTORY:   Past Surgical History  Procedure Laterality Date  . Tonsillectomy    . Cardiac catheterization N/A 09/30/2015    Procedure: Left Heart Cath and Coronary Angiography;  Surgeon: Iran Ouch, MD;   Location: [facility] INVASIVE CV LAB;  Service: Cardiovascular;  Laterality: N/A;  . Cardiac catheterization N/A 09/30/2015    Procedure: Coronary Stent Intervention;  Surgeon: Iran Ouch, MD;  Location: [facility] INVASIVE CV LAB;  Service: Cardiovascular;  Laterality: N/A;    SOCIAL HISTORY:   Social History  Substance Use Topics  . Smoking status: Former Smoker -- 0.50 packs/day for 44 years    Types: Cigarettes    Quit date: 06/02/2015  . Smokeless tobacco: Not on file  . Alcohol Use: 0.0 oz/week    0 Standard drinks or equivalent per week    FAMILY HISTORY:   Family History  Problem Relation Age of Onset  . Diabetes Mother   . Heart attack Mother   . Heart attack Father   . Heart attack Paternal Grandfather     DRUG ALLERGIES:  No Known Allergies  REVIEW OF SYSTEMS:   Review of Systems  Constitutional: Negative for fever, chills and weight loss.  HENT: Negative for congestion, nosebleeds and tinnitus.   Eyes: Negative for blurred vision, double vision and redness.  Respiratory: Negative for cough, hemoptysis, shortness of breath and wheezing.   Cardiovascular: Negative for chest pain, orthopnea, leg swelling and PND.  Gastrointestinal: Negative for nausea, vomiting, abdominal pain, diarrhea and melena.  Genitourinary: Negative for dysuria, urgency and hematuria.  Musculoskeletal: Negative for joint pain and falls.  Neurological: Negative for dizziness, tingling, sensory change, focal weakness, seizures, weakness and headaches.  Endo/Heme/Allergies: Negative for polydipsia. Does not  bruise/bleed easily.  Psychiatric/Behavioral: Negative for depression and memory loss. The patient is not nervous/anxious.   All other systems reviewed and are negative.   MEDICATIONS AT HOME:   Prior to Admission medications   Medication Sig Start Date End Date Taking? Authorizing Provider  aspirin 81 MG chewable tablet Chew 1 tablet (81 mg total) by mouth daily. 10/02/15  Yes Antonieta Ibaimothy J  Gollan, MD  atorvastatin (LIPITOR) 80 MG tablet Take 1 tablet (80 mg total) by mouth daily at 6 PM. 10/02/15  Yes Antonieta Ibaimothy J Gollan, MD  carvedilol (COREG) 3.125 MG tablet Take 1 tablet (3.125 mg total) by mouth 2 (two) times daily with a meal. 10/02/15  Yes Antonieta Ibaimothy J Gollan, MD  lisinopril (PRINIVIL,ZESTRIL) 5 MG tablet Take 1 tablet (5 mg total) by mouth daily. 10/02/15  Yes Antonieta Ibaimothy J Gollan, MD  metFORMIN (GLUCOPHAGE) 1000 MG tablet Take 1 tablet (1,000 mg total) by mouth 2 (two) times daily with a meal. 10/13/15  Yes Glori LuisEric G Sonnenberg, MD  ticagrelor (BRILINTA) 90 MG TABS tablet Take 1 tablet (90 mg total) by mouth 2 (two) times daily. 10/02/15  Yes Antonieta Ibaimothy J Gollan, MD  nitroGLYCERIN (NITROSTAT) 0.4 MG SL tablet Place 1 tablet (0.4 mg total) under the tongue every 5 (five) minutes as needed for chest pain. 10/02/15   Antonieta Ibaimothy J Gollan, MD      VITAL SIGNS:  Blood pressure 135/64, pulse 81, temperature 98.7 F (37.1 C), temperature source Oral, resp. rate 18, height 5\' 10"  (1.778 m), weight 136.986 kg (302 lb), SpO2 96 %.  PHYSICAL EXAMINATION:  Physical Exam  GENERAL:  60 y.o.-year-old Obese patient lying in the bed in no acute distress.  EYES: Pupils equal, round, reactive to light and accommodation. No scleral icterus. Extraocular muscles intact.  HEENT: Head atraumatic, normocephalic. Oropharynx and nasopharynx clear. No oropharyngeal erythema, moist oral mucosa  NECK:  Supple, no jugular venous distention. No thyroid enlargement, no tenderness.  LUNGS: Normal breath sounds bilaterally, no wheezing, rales, rhonchi. No use of accessory muscles of respiration.  CARDIOVASCULAR: S1, S2 RRR. No murmurs, rubs, gallops, clicks.  ABDOMEN: Soft, nontender, nondistended. Bowel sounds present. No organomegaly or mass.  EXTREMITIES: No pedal edema, cyanosis, or clubbing. + 2 pedal & radial pulses b/l.   NEUROLOGIC: Cranial nerves II through XII are intact. No focal Motor or sensory deficits appreciated  b/l PSYCHIATRIC: The patient is alert and oriented x 3. Good affect.  SKIN: No obvious rash, lesion, or ulcer.   LABORATORY PANEL:   CBC  Recent Labs Lab 10/20/15 1540  WBC 11.9*  HGB 14.6  HCT 43.0  PLT 245   ------------------------------------------------------------------------------------------------------------------  Chemistries   Recent Labs Lab 10/20/15 1540  NA 135  K 4.2  CL 107  CO2 23  GLUCOSE 112*  BUN 19  CREATININE 0.96  CALCIUM 9.0   ------------------------------------------------------------------------------------------------------------------  Cardiac Enzymes  Recent Labs Lab 10/20/15 1540  TROPONINI <0.03   ------------------------------------------------------------------------------------------------------------------  RADIOLOGY:  Dg Chest 2 View  10/20/2015  CLINICAL DATA:  Chest pain at cardiac rehab. Jaw tightness, arm tingling. EXAM: CHEST  2 VIEW COMPARISON:  08/06/2007 FINDINGS: Heart and mediastinal contours are within normal limits. No focal opacities or effusions. No acute bony abnormality. IMPRESSION: No active cardiopulmonary disease. Electronically Signed   By: Charlett NoseKevin  Dover M.D.   On: 10/20/2015 16:14     IMPRESSION AND PLAN:   60 year old male with past medical history of recent STEMI status post drug-eluting stent to the LAD, diabetes, obesity, hypertension, hyperlipidemia who  presents to the hospital due to jaw numbness and arm tingling.  #1 jaw numbness/arm tingling-this is thought to be an anginal equivalent given the fact that he had similar symptoms with his recent STEMI about 3 weeks ago. -Patient developed these symptoms while he was in cardiac rehabilitation on a treadmill. He is not clinically asymptomatic. -His EKG shows no evidence of acute ST or T-wave changes. His first set of cardiac markers are negative. I will observe him overnight on telemetry, follow serial cardiac markers. Continue aspirin, Brilinta,  nitroglycerin, morphine, beta blocker, statin. - I will get a cardiology consult tablet [facility].   #2 hypertension-continue carvedilol, lisinopril.  #3 hyperlipidemia-continue atorvastatin.  #4 diabetes type 2 without complication-continue metformin. Carb controlled diet.   All the records are reviewed and case discussed with ED provider. Management plans discussed with the patient, family and they are in agreement.  CODE STATUS: Full   TOTAL TIME TAKING CARE OF THIS PATIENT: 45 minutes.    Houston Siren M.D on 10/20/2015 at 5:43 PM  Between 7am to 6pm - Pager - [phone]  After 6pm go to [email or web address] - password [email or web address]  [facility]  Office  [phone]  CC: Primary care physician; [name], MD

## 2015-10-20 NOTE — ED Notes (Signed)
Pt was at cardiac rehab, at the end of exercise pt has a tightness in chest and tingling in his arm. Pt now denies any pain. Pt in no acute distress

## 2015-10-20 NOTE — Progress Notes (Signed)
Patient in room from ED. Tele box on and verified with Gena Frayhris RN. Skin verified with Maddie rn. No complaints of pain patient oriented to room.

## 2015-10-20 NOTE — Telephone Encounter (Signed)
PATIENT IS AT CARD REHAB ORIENTATION AND IS HAVING JAW PAIN AND TINGLING IN BOTH ARMS SOB   NURSE IS CONCERNED ABOUT PROCEEDING WITH REHAB BEFORE UPCOMING STRESS TEST

## 2015-10-20 NOTE — Progress Notes (Signed)
Cardiac Individual Treatment Plan  Patient Details  Name: Michael Jordan MRN: [id number] Date of Birth: 03/16/1956 Referring Provider:  Iran Ouch, MD  Initial Encounter Date:       Cardiac Rehab from 10/20/2015 in [facility] Cardiac and Pulmonary Rehab   Date  10/20/15      Visit Diagnosis: Status post coronary artery stent placement  ST elevation myocardial infarction (STEMI), unspecified artery (HCC)  Patient's Home Medications on Admission:  Current outpatient prescriptions:  .  aspirin 81 MG chewable tablet, Chew 1 tablet (81 mg total) by mouth daily., Disp: , Rfl:  .  atorvastatin (LIPITOR) 80 MG tablet, Take 1 tablet (80 mg total) by mouth daily at 6 PM., Disp: 30 tablet, Rfl: 6 .  carvedilol (COREG) 3.125 MG tablet, Take 1 tablet (3.125 mg total) by mouth 2 (two) times daily with a meal., Disp: 60 tablet, Rfl: 6 .  lisinopril (PRINIVIL,ZESTRIL) 5 MG tablet, Take 1 tablet (5 mg total) by mouth daily., Disp: 30 tablet, Rfl: 6 .  metFORMIN (GLUCOPHAGE) 1000 MG tablet, Take 1 tablet (1,000 mg total) by mouth 2 (two) times daily with a meal., Disp: 180 tablet, Rfl: 3 .  nitroGLYCERIN (NITROSTAT) 0.4 MG SL tablet, Place 1 tablet (0.4 mg total) under the tongue every 5 (five) minutes as needed for chest pain., Disp: 25 tablet, Rfl: 3 .  ticagrelor (BRILINTA) 90 MG TABS tablet, Take 1 tablet (90 mg total) by mouth 2 (two) times daily., Disp: 60 tablet, Rfl: 6  Past Medical History: Past Medical History  Diagnosis Date  . CAD (coronary artery disease)     a. 09/2015 Ant STEMI/PCI: LM nl, LAD 100p (3.0x18 Xience Alpine DES), D1 40, SP1 40ost, D3 min irregs, LCX 61m, OM1 nl, OM3 min irregs, RCA 80p/127m (CTO)->fills via collats, EF 45-50%.  . Cardiomyopathy, ischemic     a. 09/2015 Echo: EF 35-40%, sev apical, periapical, antapical HK, mild to mod ant, inf HK, Gr1 DD.  . Morbid obesity (HCC)   . Tobacco abuse     a. Quit 05/2015.  Marland Kitchen Hyperlipidemia   . Diabetes (HCC)     Tobacco  Use: History  Smoking status  . Former Smoker -- 0.50 packs/day for 44 years  . Types: Cigarettes  . Quit date: 06/02/2015  Smokeless tobacco  . Not on file    Labs: Recent Review Flowsheet Data    Labs for ITP Cardiac and Pulmonary Rehab Latest Ref Rng 10/01/2015   Cholestrol 0 - 200 mg/dL 782(N)   LDLCALC 0 - 99 mg/dL 562(Z)   HDL >30 mg/dL 41   Trlycerides <865 mg/dL 784(O)   Hemoglobin N6E 4.0 - 6.0 % 8.0(H)       Exercise Target Goals: Date: 10/20/15  Exercise Program Goal: Individual exercise prescription set with THRR, safety & activity barriers. Participant demonstrates ability to understand and report RPE using BORG scale, to self-measure pulse accurately, and to acknowledge the importance of the exercise prescription.  Exercise Prescription Goal: Starting with aerobic activity 30 plus minutes a day, 3 days per week for initial exercise prescription. Provide home exercise prescription and guidelines that participant acknowledges understanding prior to discharge.  Activity Barriers & Risk Stratification:     Activity Barriers & Cardiac Risk Stratification - 10/20/15 1635    Activity Barriers & Cardiac Risk Stratification   Activity Barriers Other (comment)   Comments Newly diagnosed Cardiac Patient   Cardiac Risk Stratification High      6 Minute Walk:  6 Minute Walk      10/20/15 1644       6 Minute Walk   Phase Initial     Distance 1360 feet     Walk Time 6 minutes     # of Rest Breaks 0     RPE 12     Symptoms Yes (comment)     Comments SOB, tingling in arms and legs, mild jaw pain. Was taken to the ER due to these symptoms     Resting HR 89 bpm     Resting BP 146/80 mmHg     Max Ex. HR 116 bpm     Max Ex. BP 158/82 mmHg        Initial Exercise Prescription:     Initial Exercise Prescription - 10/20/15 1600    Date of Initial Exercise Prescription   Date 10/20/15   Treadmill   MPH 2.4   Grade 0   Minutes 10   Recumbant Bike    Level 2   RPM 40   Watts 20   Minutes 15   NuStep   Level 2   Watts 30   Minutes 15   Arm Ergometer   Level 1   Watts 8   Minutes 10   Arm/Foot Ergometer   Level 4   Watts 12   Minutes 10   Recumbant Elliptical   Level 2   RPM 40   Watts 20   Minutes 15   REL-XR   Level 3   Watts 35   Minutes 15   T5 Nustep   Level 2   Watts 20   Minutes 15   Biostep-RELP   Level 3   Watts 20   Minutes 15   Prescription Details   Frequency (times per week) 3   Duration Progress to 30 minutes of continuous aerobic without signs/symptoms of physical distress   Intensity   THRR REST +  30   Ratings of Perceived Exertion 11-15   Progression   Progression Continue progressive overload as per policy without signs/symptoms or physical distress.   Resistance Training   Training Prescription Yes   Weight 2   Reps 10-15      Perform Capillary Blood Glucose checks as needed.  Exercise Prescription Changes:   Exercise Comments:   Discharge Exercise Prescription (Final Exercise Prescription Changes):   Nutrition:  Target Goals: Understanding of nutrition guidelines, daily intake of sodium 1500mg , cholesterol 200mg , calories 30% from fat and 7% or less from saturated fats, daily to have 5 or more servings of fruits and vegetables.  Biometrics:     Pre Biometrics - 10/20/15 1643    Pre Biometrics   Height 5\' 10"  (1.778 m)   Weight (!) 302 lb 11.2 oz (137.304 kg)   Waist Circumference 52.5 inches   Hip Circumference 56.5 inches   Waist to Hip Ratio 0.93 %   BMI (Calculated) 43.5       Nutrition Therapy Plan and Nutrition Goals:   Nutrition Discharge: Rate Your Plate Scores:   Nutrition Goals Re-Evaluation:   Psychosocial: Target Goals: Acknowledge presence or absence of depression, maximize coping skills, provide positive support system. Participant is able to verbalize types and ability to use techniques and skills needed for reducing stress and  depression.  Initial Review & Psychosocial Screening:     Initial Psych Review & Screening - 10/20/15 1646    Initial Review   Current issues with --  Patient denies being depressed.  Laughing,  talkative, outgoing.  Speaking to others in the hallway when we walked through hallway.     Family Dynamics   Good Support System? Yes  Married x 25 years.  Has 2 children and 2 grand children.  Has church affiliation with [facility] [employer].     Barriers   Psychosocial barriers to participate in program There are no identifiable barriers or psychosocial needs.   Screening Interventions   Interventions Encouraged to exercise      Quality of Life Scores:   PHQ-9:     Recent Review Flowsheet Data    Depression screen [facility] 2/9 10/20/2015   Decreased Interest 0   Down, Depressed, Hopeless 0   PHQ - 2 Score 0   Altered sleeping 1   Tired, decreased energy 0   Change in appetite 0   Feeling bad or failure about yourself  0   Trouble concentrating 0   Moving slowly or fidgety/restless 0   Suicidal thoughts 0   PHQ-9 Score 1   Difficult doing work/chores Somewhat difficult      Psychosocial Evaluation and Intervention:   Psychosocial Re-Evaluation:   Vocational Rehabilitation: Provide vocational rehab assistance to qualifying candidates.   Vocational Rehab Evaluation & Intervention:     Vocational Rehab - 10/20/15 1639    Initial Vocational Rehab Evaluation & Intervention   Assessment shows need for Vocational Rehabilitation No      Education: Education Goals: Education classes will be provided on a weekly basis, covering required topics. Participant will state understanding/return demonstration of topics presented.  Learning Barriers/Preferences:     Learning Barriers/Preferences - 10/20/15 1637    Learning Barriers/Preferences   Learning Barriers None   Learning Preferences None      Education Topics: General Nutrition Guidelines/Fats and Fiber: -Group  instruction provided by verbal, written material, models and posters to present the general guidelines for heart healthy nutrition. Gives an explanation and review of dietary fats and fiber.   Controlling Sodium/Reading Food Labels: -Group verbal and written material supporting the discussion of sodium use in heart healthy nutrition. Review and explanation with models, verbal and written materials for utilization of the food label.   Exercise Physiology & Risk Factors: - Group verbal and written instruction with models to review the exercise physiology of the cardiovascular system and associated critical values. Details cardiovascular disease risk factors and the goals associated with each risk factor.   Aerobic Exercise & Resistance Training: - Gives group verbal and written discussion on the health impact of inactivity. On the components of aerobic and resistive training programs and the benefits of this training and how to safely progress through these programs.   Flexibility, Balance, General Exercise Guidelines: - Provides group verbal and written instruction on the benefits of flexibility and balance training programs. Provides general exercise guidelines with specific guidelines to those with heart or lung disease. Demonstration and skill practice provided.   Stress Management: - Provides group verbal and written instruction about the health risks of elevated stress, cause of high stress, and healthy ways to reduce stress.   Depression: - Provides group verbal and written instruction on the correlation between heart/lung disease and depressed mood, treatment options, and the stigmas associated with seeking treatment.   Anatomy & Physiology of the Heart: - Group verbal and written instruction and models provide basic cardiac anatomy and physiology, with the coronary electrical and arterial systems. Review of: AMI, Angina, Valve disease, Heart Failure, Cardiac Arrhythmia, Pacemakers,  and the ICD.   Cardiac  Procedures: - Group verbal and written instruction and models to describe the testing methods done to diagnose heart disease. Reviews the outcomes of the test results. Describes the treatment choices: Medical Management, Angioplasty, or Coronary Bypass Surgery.   Cardiac Medications: - Group verbal and written instruction to review commonly prescribed medications for heart disease. Reviews the medication, class of the drug, and side effects. Includes the steps to properly store meds and maintain the prescription regimen.   Go Sex-Intimacy & Heart Disease, Get SMART - Goal Setting: - Group verbal and written instruction through game format to discuss heart disease and the return to sexual intimacy. Provides group verbal and written material to discuss and apply goal setting through the application of the S.M.A.R.T. Method.   Other Matters of the Heart: - Provides group verbal, written materials and models to describe Heart Failure, Angina, Valve Disease, and Diabetes in the realm of heart disease. Includes description of the disease process and treatment options available to the cardiac patient.   Exercise & Equipment Safety: - Individual verbal instruction and demonstration of equipment use and safety with use of the equipment.          [facility]   Date  10/20/15   Educator  D. Delford FieldWright, RN   Instruction Review Code  1- partially meets, needs review/practice      Infection Prevention: - Provides verbal and written material to individual with discussion of infection control including proper hand washing and proper equipment cleaning during exercise session.      [facility]   Date  10/20/15   Educator  D. Delford FieldWright, RN      Falls Prevention: - Provides verbal and written material to individual with discussion of falls prevention and safety.      [facility]   Date  10/20/15   Educator  D. Delford FieldWright, RN   Instruction Review Code  2- meets goals/outcomes      Diabetes: - Individual verbal and written instruction to review signs/symptoms of diabetes, desired ranges of glucose level fasting, after meals and with exercise. Advice that pre and post exercise glucose checks will be done for 3 sessions at entry of program.      [facility]   Date  10/20/15   Educator  D. Delford FieldWright, RN   Instruction Review Code  1- partially meets, needs review/practice       Knowledge Questionnaire Score:     Knowledge Questionnaire Score - 10/20/15 1637    Knowledge Questionnaire Score   Pre Score 22/28      Core Components/Risk Factors/Patient Goals at Admission:     Personal Goals and Risk Factors at Admission - 10/20/15 1641    Core Components/Risk Factors/Patient Goals on Admission    Weight Management Yes;Obesity   Intervention Weight Management/Obesity: Establish reasonable short term and long term weight goals.;Obesity: Provide education and appropriate resources to help participant work on and attain dietary goals.   Admit Weight 302 lb 11.2 oz (137.304 kg)   Goal Weight: Short Term 290 lb (131.543 kg)  Lose 1 - 2 pounds per week.     Goal Weight: Long Term 200 lb (90.719 kg)   Expected Outcomes Short Term: Continue to assess and modify interventions until short term weight is achieved;Long Term: Adherence to nutrition and physical activity/exercise program  aimed toward attainment of established weight goal;Weight Loss: Understanding of general recommendations for a balanced deficit meal plan, which promotes 1-2 lb weight loss per week and includes a negative energy balance of [phone] kcal/d;Understanding recommendations for meals to include 15-35% energy as protein, 25-35% energy from fat, 35-60% energy from carbohydrates, less than 200mg  of  dietary cholesterol, 20-35 gm of total fiber daily;Understanding of distribution of calorie intake throughout the day with the consumption of 4-5 meals/snacks   Sedentary Yes   Intervention Provide advice, education, support and counseling about physical activity/exercise needs.;Develop an individualized exercise prescription for aerobic and resistive training based on initial evaluation findings, risk stratification, comorbidities and participant's personal goals.   Expected Outcomes Achievement of increased cardiorespiratory fitness and enhanced flexibility, muscular endurance and strength shown through measurements of functional capacity and personal statement of participant.   Increase Strength and Stamina Yes   Intervention Provide advice, education, support and counseling about physical activity/exercise needs.;Develop an individualized exercise prescription for aerobic and resistive training based on initial evaluation findings, risk stratification, comorbidities and participant's personal goals.   Expected Outcomes Achievement of increased cardiorespiratory fitness and enhanced flexibility, muscular endurance and strength shown through measurements of functional capacity and personal statement of participant.   Diabetes Yes   Intervention Provide education about signs/symptoms and action to take for hypo/hyperglycemia.;Provide education about proper nutrition, including hydration, and aerobic/resistive exercise prescription along with prescribed medications to achieve blood glucose in normal ranges: Fasting glucose 65-99 mg/dL   Expected Outcomes Short Term: Participant verbalizes understanding of the signs/symptoms and immediate care of hyper/hypoglycemia, proper foot care and importance of medication, aerobic/resistive exercise and nutrition plan for blood glucose control.;Long Term: Attainment of HbA1C < 7%.   Hypertension Yes   Intervention Provide education on lifestyle modifcations including  regular physical activity/exercise, weight management, moderate sodium restriction and increased consumption of fresh fruit, vegetables, and low fat dairy, alcohol moderation, and smoking cessation.;Monitor prescription use compliance.   Expected Outcomes Short Term: Continued assessment and intervention until BP is < 140/2mm HG in hypertensive participants. < 130/65mm HG in hypertensive participants with diabetes, heart failure or chronic kidney disease.;Long Term: Maintenance of blood pressure at goal levels.   Lipids Yes   Intervention Provide education and support for participant on nutrition & aerobic/resistive exercise along with prescribed medications to achieve LDL 70mg , HDL >40mg .   Expected Outcomes Short Term: Participant states understanding of desired cholesterol values and is compliant with medications prescribed. Participant is following exercise prescription and nutrition guidelines.;Long Term: Cholesterol controlled with medications as prescribed, with individualized exercise RX and with personalized nutrition plan. Value goals: LDL < 70mg , HDL > 40 mg.      Core Components/Risk Factors/Patient Goals Review:    Core Components/Risk Factors/Patient Goals at Discharge (Final Review):    ITP Comments:  Michael Jordan was accompanied by his wife to orientation / med review for Cardiac Rehab program.  Michael Jordan c/o of jaw tightness, slight shortness of breath,  and tingling - pins and needles in both arms/hands at the end of his 6 minute walk.  All symptoms subsided when he sat down in the chair after the 6 minute walk.  Michael Jordan stated he had not had any jaw tightness or discomfort since the day he had his STEMI approximately 3 weeks ago.  BP stable at 158/82.  HR increased to 116 during walk.  Sinus tach on the monitor during the walk.  Prior to walk patient's HR 86 - 89 sinus rhythm with occasional PVCs.  Dr. Kirke Corin paged x 2.  RN called Dr. Jari Sportsman office.  Dr. Kirke Corin out of town.  Patient taken to ER  via wheelchair for further evaluation and treatment.    Comments:

## 2015-10-20 NOTE — Telephone Encounter (Signed)
S/w Diane, RN, in Cardiac Rehab who reports pt in attendance for orientation.  During his 6 minute walk, pt began to experience jaw pain, bilateral arm tingling, SOB, and chest discomfort. States pt reports symptoms improved w/rest but were similar, though less mild, to when he had STEMI earlier this month.  She inquires as to what patient should do. Due to the fact pt is experiencing similar symptoms as recent MI, I have advised Diane to have pt proceed to ED to be evaluated. She verbalized understanding and will relay information to patient.

## 2015-10-21 DIAGNOSIS — I2510 Atherosclerotic heart disease of native coronary artery without angina pectoris: Secondary | ICD-10-CM

## 2015-10-21 DIAGNOSIS — R202 Paresthesia of skin: Secondary | ICD-10-CM | POA: Diagnosis not present

## 2015-10-21 DIAGNOSIS — R0602 Shortness of breath: Secondary | ICD-10-CM | POA: Diagnosis not present

## 2015-10-21 DIAGNOSIS — E1165 Type 2 diabetes mellitus with hyperglycemia: Secondary | ICD-10-CM | POA: Insufficient documentation

## 2015-10-21 DIAGNOSIS — Z955 Presence of coronary angioplasty implant and graft: Secondary | ICD-10-CM | POA: Insufficient documentation

## 2015-10-21 DIAGNOSIS — R6884 Jaw pain: Secondary | ICD-10-CM | POA: Diagnosis not present

## 2015-10-21 DIAGNOSIS — E118 Type 2 diabetes mellitus with unspecified complications: Secondary | ICD-10-CM | POA: Insufficient documentation

## 2015-10-21 DIAGNOSIS — E1159 Type 2 diabetes mellitus with other circulatory complications: Secondary | ICD-10-CM

## 2015-10-21 LAB — BASIC METABOLIC PANEL
Anion gap: 6 (ref 5–15)
BUN: 22 mg/dL — ABNORMAL HIGH (ref 6–20)
CO2: 25 mmol/L (ref 22–32)
Calcium: 8.7 mg/dL — ABNORMAL LOW (ref 8.9–10.3)
Chloride: 104 mmol/L (ref 101–111)
Creatinine, Ser: 1.01 mg/dL (ref 0.61–1.24)
GFR calc Af Amer: >60 mL/min (ref >60)
GFR calc non Af Amer: >60 mL/min (ref >60)
Glucose, Bld: 124 mg/dL — ABNORMAL HIGH (ref 65–99)
Potassium: 3.8 mmol/L (ref 3.5–5.1)
Sodium: 135 mmol/L (ref 135–145)

## 2015-10-21 LAB — CBC
HCT: 39.3 % — ABNORMAL LOW (ref 40.0–52.0)
Hemoglobin: 13.4 g/dL (ref 13.0–18.0)
MCH: 29.5 pg (ref 26.0–34.0)
MCHC: 34.2 g/dL (ref 32.0–36.0)
MCV: 86.4 fL (ref 80.0–100.0)
Platelets: 211 10*3/uL (ref 150–440)
RBC: 4.55 MIL/uL (ref 4.40–5.90)
RDW: 14.1 % (ref 11.5–14.5)
WBC: 10.2 10*3/uL (ref 3.8–10.6)

## 2015-10-21 LAB — TROPONIN I: Troponin I: <0.03 ng/mL (ref <0.031)

## 2015-10-21 MED ORDER — SPIRONOLACTONE 25 MG PO TABS
12.5000 mg | ORAL_TABLET | Freq: Every day | ORAL | Status: DC
  Administered 2015-10-21: 12.5 mg via ORAL
  Filled 2015-10-21: qty 1

## 2015-10-21 MED ORDER — SPIRONOLACTONE 25 MG PO TABS: 12.5000 mg | ORAL_TABLET | Freq: Every day | ORAL | Status: DC

## 2015-10-21 NOTE — Progress Notes (Signed)
Patient being discharged home at this time, discharged instruction provided, iv removed, tele removed, patient discharged home self

## 2015-10-21 NOTE — Plan of Care (Signed)
Problem: Education: Goal: Knowledge of  General Education information/materials will improve Outcome: Completed/Met Date Met:  10/21/15 Handout given to patient in relations to her Dx.  Problem: Safety: Goal: Ability to remain free from injury will improve Outcome: Progressing Patient has remained in her bed, and injury free during the shift              Problem: Pain Managment: Goal: General experience of comfort will improve Outcome: Completed/Met Date Met:  10/21/15 No pain noted during shift will continue to monitor.

## 2015-10-21 NOTE — Consult Note (Signed)
Cardiology Consultation Note  Patient ID: Michael Jordan, MRN: [id number], DOB/AGE: 1956/01/30 60 y.o. Admit date: 10/20/2015   Date of Consult: 10/21/2015 Primary Physician: Marikay AlarEric Sonnenberg, MD Primary Cardiologist: Dr. Kirke CorinArida, MD  Chief Complaint: Jaw pain Reason for Consult: Jaw pain  HPI: 60 y.o. male with h/o CAD s/p recent anterior ST elevation MI 09/30/2015 s/p PCI/DES to LAD, ischemic cardiomyopathy, morbid obesity, HLD, DM, and tobacco abuse who presented with jaw pain after just starting cardiac rehab.   [facility] with acute onset chest pain and was found to have anterior ST elevation on his EKG this [facility]. He underwent emergent cardiac catheterization which showed occluded proximal LAD with mild disease affecting the diagonals, chronically occluded right coronary artery with left to right collaterals and moderate left circumflex disease. He underwent successful angioplasty and drug-eluting stent placement in the proximal LAD. Ejection fraction was 40-45% by LV gram and 35-40% by echocardiogram. He did very well and was discharged on medical therapy. In hospital follow up he had not had any chest tightness, though ahd noted some twinged and exertional dyspnea. He was scheduled for a nuclear stress test 4 weeks s/p MI as part of him going back to work given that he works as a Naval architecttruck driver and must have an EF of 40% per [employer] guidelines. He does have a known occluded RCA that is planned to be treated medically at this time. He was at cardiac rehab on 3/29 when he developed a brief episode of jaw pain and bilateral hand numbness lasting a couple of seconds upon completion of his cardiac rehab session. He reports being frustrated with providers stating he has had chest pain as he never had any chest pain. His symptoms were not similar to his MI earlier this month. He has been active at home without any issues. He feelsl ike he just got anxious. He has been symptom free since  his arrival. Upon the [facility]'s [facility] they were found to have troponin negative x 3, initial CBC showed WBC 11.9-->10.2, K+ 4.0-->4.2. ECG showed NSR, 74 bpm, low voltage QRS, inferior Q waves, cannot rule out anteroseptal infarct, CXR showed no active cardiopulmonary disease.   Past Medical History  Diagnosis Date  . CAD (coronary artery disease)     a. 09/2015 Ant STEMI/PCI: LM nl, LAD 100p (3.0x18 Xience Alpine DES), D1 40, SP1 40ost, D3 min irregs, LCX 581m, OM1 nl, OM3 min irregs, RCA 80p/17880m (CTO)->fills via collats, EF 45-50%.  . Cardiomyopathy, ischemic     a. 09/2015 Echo: EF 35-40%, sev apical, periapical, antapical HK, mild to mod ant, inf HK, Gr1 DD.  . Morbid obesity (HCC)   . Tobacco abuse     a. Quit 05/2015.  Marland Kitchen. Hyperlipidemia   . Diabetes (HCC)       Most Recent Cardiac Studies: As above.   Surgical History:  Past Surgical History  Procedure Laterality Date  . Tonsillectomy    . Cardiac catheterization N/A 09/30/2015    Procedure: Left Heart Cath and Coronary Angiography;  Surgeon: Iran OuchMuhammad A Arida, MD;  Location: [facility] INVASIVE CV LAB;  Service: Cardiovascular;  Laterality: N/A;  . Cardiac catheterization N/A 09/30/2015    Procedure: Coronary Stent Intervention;  Surgeon: Iran OuchMuhammad A Arida, MD;  Location: [facility] INVASIVE CV LAB;  Service: Cardiovascular;  Laterality: N/A;     Home Meds: Prior to Admission medications   Medication Sig Start Date End Date Taking? Authorizing Provider  aspirin 81 MG  chewable tablet Chew 1 tablet (81 mg total) by mouth daily. 10/02/15  Yes Antonieta Iba, MD  atorvastatin (LIPITOR) 80 MG tablet Take 1 tablet (80 mg total) by mouth daily at 6 PM. 10/02/15  Yes Antonieta Iba, MD  carvedilol (COREG) 3.125 MG tablet Take 1 tablet (3.125 mg total) by mouth 2 (two) times daily with a meal. 10/02/15  Yes Antonieta Iba, MD  lisinopril (PRINIVIL,ZESTRIL) 5 MG tablet Take 1 tablet (5 mg total) by mouth daily. 10/02/15  Yes Antonieta Iba,  MD  metFORMIN (GLUCOPHAGE) 1000 MG tablet Take 1 tablet (1,000 mg total) by mouth 2 (two) times daily with a meal. 10/13/15  Yes [name], MD  ticagrelor (BRILINTA) 90 MG TABS tablet Take 1 tablet (90 mg total) by mouth 2 (two) times daily. 10/02/15  Yes Antonieta Iba, MD  nitroGLYCERIN (NITROSTAT) 0.4 MG SL tablet Place 1 tablet (0.4 mg total) under the tongue every 5 (five) minutes as needed for chest pain. 10/02/15   Antonieta Iba, MD    Inpatient Medications:  . aspirin  81 mg Oral Daily  . atorvastatin  80 mg Oral q1800  . carvedilol  3.125 mg Oral BID WC  . lisinopril  5 mg Oral Daily  . metFORMIN  1,000 mg Oral BID WC  . sodium chloride flush  3 mL Intravenous Q12H  . ticagrelor  90 mg Oral BID      Allergies: No Known Allergies  Social History   Social History  . Marital Status: Married    Spouse Name: N/A  . Number of Children: N/A  . Years of Education: N/A   Occupational History  . Not on file.   Social History Main Topics  . Smoking status: Former Smoker -- 0.50 packs/day for 44 years    Types: Cigarettes    Quit date: 06/02/2015  . Smokeless tobacco: Not on file  . Alcohol Use: 0.0 oz/week    0 Standard drinks or equivalent per week  . Drug Use: No  . Sexual Activity: Not on file   Other Topics Concern  . Not on file   Social History Narrative     Family History  Problem Relation Age of Onset  . Diabetes Mother   . Heart attack Mother   . Heart attack Father   . Heart attack Paternal Grandfather      Review of Systems: Review of Systems  Constitutional: Positive for weight loss. Negative for fever, chills, malaise/fatigue and diaphoresis.  HENT: Negative for congestion.   Eyes: Negative for discharge and redness.  Respiratory: Negative for cough, hemoptysis, sputum production, shortness of breath and wheezing.   Cardiovascular: Negative for chest pain, palpitations, orthopnea, claudication, leg swelling and PND.  Musculoskeletal:  Negative for myalgias and falls.  Skin: Negative for rash.  Neurological: Positive for sensory change. Negative for dizziness, tingling, tremors, speech change, focal weakness, loss of consciousness and weakness.  Endo/Heme/Allergies: Does not bruise/bleed easily.  Psychiatric/Behavioral: Negative for substance abuse. The patient is not nervous/anxious.   All other systems reviewed and are negative.   Labs:  Recent Labs  10/20/15 1540 10/20/15 1928 10/20/15 2229 10/21/15 0309  TROPONINI <0.03 <0.03 <0.03 <0.03   Lab Results  Component Value Date   WBC 10.2 10/21/2015   HGB 13.4 10/21/2015   HCT 39.3* 10/21/2015   MCV 86.4 10/21/2015   PLT 211 10/21/2015    Recent Labs Lab 10/21/15 0309  NA 135  K 3.8  CL  104  CO2 25  BUN 22*  CREATININE 1.01  CALCIUM 8.7*  GLUCOSE 124*   Lab Results  Component Value Date   CHOL 214* 10/01/2015   HDL 41 10/01/2015   LDLCALC 126* 10/01/2015   TRIG 234* 10/01/2015   No results found for: DDIMER  Radiology/Studies:  Dg Chest 2 View  10/20/2015  CLINICAL DATA:  Chest pain at cardiac rehab. Jaw tightness, arm tingling. EXAM: CHEST  2 VIEW COMPARISON:  08/06/2007 FINDINGS: Heart and mediastinal contours are within normal limits. No focal opacities or effusions. No acute bony abnormality. IMPRESSION: No active cardiopulmonary disease. Electronically Signed   By: Charlett Nose M.D.   On: 10/20/2015 16:14    EKG: NSR, 74 bpm, low voltage QRS, inferior Q waves, cannot rule out anteroseptal infarct, anterior TWI (old)  Weights: Filed Weights   10/20/15 1527 10/20/15 1849  Weight: 302 lb (136.986 kg) 299 lb 12.8 oz (135.988 kg)     Physical Exam: Blood pressure 117/61, pulse 74, temperature 97.6 F (36.4 C), temperature source Oral, resp. rate 18, height  (1.778 m), weight 299 lb 12.8 oz (135.988 kg), SpO2 94 %. Body mass index is 43.02 kg/(m^2). General: Well developed, well nourished, in no acute distress. Head:  Normocephalic, atraumatic, sclera non-icteric, no xanthomas, nares are without discharge.  Neck: Negative for carotid bruits. JVD not elevated. Lungs: Clear bilaterally to auscultation without wheezes, rales, or rhonchi. Breathing is unlabored. Heart: RRR with S1 S2. No murmurs, rubs, or gallops appreciated. Abdomen: Obese, soft, non-tender, non-distended with normoactive bowel sounds. No hepatomegaly. No rebound/guarding. No obvious abdominal masses. Msk:  Strength and tone appear normal for age. Extremities: No clubbing or cyanosis. No edema.  Distal pedal pulses are 2+ and equal bilaterally. Neuro: Alert and oriented X 3. No facial asymmetry. No focal deficit. Moves all extremities spontaneously. Psych:  Responds to questions appropriately with a normal affect.    Assessment and Plan:   1. Jaw pain/CAD s/p PCI as above: -Symptoms lasting only a couple of seconds before self resolving. Not similar to prior event. No angina. -Troponin negative x 3. Non-acute EKG.  -He is scheduled for nuclear stress test 11/01/2015, will keep this for that time given his recent MI  -Currently asymptomatic -He has not missed any doses of aspirin or Brilinta -Continue DAPT -Continue Coreg 3.125 mg bid, Lipitor 80 mg daily, lisinopril 5 mg daily -Continue with cardiac rehab  2. Ischemic cardiomyopathy: -He does not appear volume overloaded at this time -Continue current medications as above -Add spironolactone 12.5 mg daily for cardiac remodeling with a recheck bmet in the office first of next week  3. Morbid obesity: -Weight loss advised  4. HLD: -Lipitor 80 mg daily  5. DM: -Per IM/PCP   Signed, Eula Listen, PA-C Pager: [phone] 10/21/2015, 7:28 AM

## 2015-10-21 NOTE — Discharge Instructions (Signed)

## 2015-10-21 NOTE — Progress Notes (Signed)
Patient resting in bed at this time, family at bedside, denies any pain or discomfort. Possible discharge home today as per md note , denies numbness  or tingling at this time

## 2015-10-21 NOTE — Discharge Summary (Signed)
[facility] - [place] at [facility]   PATIENT NAME: Michael Jordan    MR#:  [id number]  DATE OF BIRTH:  08-30-1955  DATE OF ADMISSION:  10/20/2015 ADMITTING PHYSICIAN: Houston Siren, MD  DATE OF DISCHARGE: 10/21/2015  1:33 PM  PRIMARY CARE PHYSICIAN: [name], MD   ADMISSION DIAGNOSIS:  Shortness of breath [R06.02] Paresthesia of both hands [R20.2]  DISCHARGE DIAGNOSIS:  Active Problems:   Chest pain   Shortness of breath   Coronary artery disease involving native coronary artery of native heart without angina pectoris   Jaw pain   Paresthesia of both hands   History of coronary artery stent placement   Uncontrolled type 2 diabetes mellitus with circulatory disorder (HCC)   SECONDARY DIAGNOSIS:   Past Medical History  Diagnosis Date  . CAD (coronary artery disease)     a. 09/2015 Ant STEMI/PCI: LM nl, LAD 100p (3.0x18 Xience Alpine DES), D1 40, SP1 40ost, D3 min irregs, LCX 33m, OM1 nl, OM3 min irregs, RCA 80p/159m (CTO)->fills via collats, EF 45-50%.  . Cardiomyopathy, ischemic     a. 09/2015 Echo: EF 35-40%, sev apical, periapical, antapical HK, mild to mod ant, inf HK, Gr1 DD.  . Morbid obesity (HCC)   . Tobacco abuse     a. Quit 05/2015.  Marland Kitchen Hyperlipidemia   . Diabetes [facility])      ADMITTING HISTORY  Michael Jordan is a 60 y.o. male with a known history of Coronary artery disease status post recent STEMI status post drug-eluting stent to the LAD, diabetes, obesity, history of tobacco abuse, hyperlipidemia who presents to the hospital from cardiac rehabilitation complaining of jaw numbness and arm and hand tingling while exercising on the treadmill. Patient says that he developed the jaw numbness when he developed his STEMI about 3 weeks ago. He was sent to the ER for further evaluation. Patient also complaining of some mild diaphoresis, shortness of breath but no nausea, vomiting, palpitations or any syncope. Given his recent STEMI and stent  placement and ongoing symptoms hospitalist services were contacted further treatment and evaluation.  HOSPITAL COURSE:   Patient was admitted to telemetry floor. Had 3 sets of cardiac enzymes checked which were normal. EKG showed no acute changes. Telemetry showed no arrhythmias. Patient did not have any further chest pain during the hospital stay. Patient was seen by Dr. Mariah Milling of cardiology. Dr. Mariah Milling did not feel like this was angina. Patient was advised to continue cardiac rehabilitation after discharge. Small dose of spironolactone added and prescription sent to pharmacy. Other medications remain unchanged. Discussed with patient and wife at bedside prior to discharge. All questions answered.  Follow-up with PCP and patient is also scheduled to have an outpatient stress test with [facility] medical group.  CONSULTS OBTAINED:  Treatment Team:  Iran Ouch, MD  DRUG ALLERGIES:  No Known Allergies  DISCHARGE MEDICATIONS:   Discharge Medication List as of 10/21/2015  1:03 PM    START taking these medications   Details  spironolactone (ALDACTONE) 25 MG tablet Take 0.5 tablets (12.5 mg total) by mouth daily., Starting 10/21/2015, Until Discontinued, Normal      CONTINUE these medications which have NOT CHANGED   Details  aspirin 81 MG chewable tablet Chew 1 tablet (81 mg total) by mouth daily., Starting 10/02/2015, Until Discontinued, No Print    atorvastatin (LIPITOR) 80 MG tablet Take 1 tablet (80 mg total) by mouth daily at 6 PM., Starting 10/02/2015, Until Discontinued, Normal  carvedilol (COREG) 3.125 MG tablet Take 1 tablet (3.125 mg total) by mouth 2 (two) times daily with a meal., Starting 10/02/2015, Until Discontinued, Normal    lisinopril (PRINIVIL,ZESTRIL) 5 MG tablet Take 1 tablet (5 mg total) by mouth daily., Starting 10/02/2015, Until Discontinued, Normal    metFORMIN (GLUCOPHAGE) 1000 MG tablet Take 1 tablet (1,000 mg total) by mouth 2 (two) times daily with a  meal., Starting 10/13/2015, Until Discontinued, Normal    ticagrelor (BRILINTA) 90 MG TABS tablet Take 1 tablet (90 mg total) by mouth 2 (two) times daily., Starting 10/02/2015, Until Discontinued, Normal    nitroGLYCERIN (NITROSTAT) 0.4 MG SL tablet Place 1 tablet (0.4 mg total) under the tongue every 5 (five) minutes as needed for chest pain., Starting 10/02/2015, Until Discontinued, Normal        Today   VITAL SIGNS:  Blood pressure 143/98, pulse 73, temperature 98.1 F (36.7 C), temperature source Oral, resp. rate 18, height  (1.778 m), weight 135.988 kg (299 lb 12.8 oz), SpO2 95 %.  I/O:   Intake/Output Summary (Last 24 hours) at 10/21/15 1543 Last data filed at 10/21/15 0755  Gross per 24 hour  Intake    240 ml  Output      0 ml  Net    240 ml    PHYSICAL EXAMINATION:  Physical Exam  GENERAL:  60 y.o.-year-old patient lying in the bed with no acute distress. Morbidly obese LUNGS: Normal breath sounds bilaterally, no wheezing, rales,rhonchi or crepitation. No use of accessory muscles of respiration.  CARDIOVASCULAR: S1, S2 normal. No murmurs, rubs, or gallops.  PSYCHIATRIC: The patient is alert and oriented x 3.   DATA REVIEW:   CBC  Recent Labs Lab 10/21/15 0309  WBC 10.2  HGB 13.4  HCT 39.3*  PLT 211    Chemistries   Recent Labs Lab 10/21/15 0309  NA 135  K 3.8  CL 104  CO2 25  GLUCOSE 124*  BUN 22*  CREATININE 1.01  CALCIUM 8.7*    Cardiac Enzymes  Recent Labs Lab 10/21/15 0309  TROPONINI <0.03    Microbiology Results  Results for orders placed or performed during the hospital encounter of 09/30/15  MRSA PCR Screening     Status: None   Collection Time: 09/30/15 11:13 AM  Result Value Ref Range Status   MRSA by PCR NEGATIVE NEGATIVE Final    Comment:        The GeneXpert MRSA Assay (FDA approved for NASAL specimens only), is one component of a comprehensive MRSA colonization surveillance program. It is not intended to  diagnose MRSA infection nor to guide or monitor treatment for MRSA infections.     RADIOLOGY:  Dg Chest 2 View  10/20/2015  CLINICAL DATA:  Chest pain at cardiac rehab. Jaw tightness, arm tingling. EXAM: CHEST  2 VIEW COMPARISON:  08/06/2007 FINDINGS: Heart and mediastinal contours are within normal limits. No focal opacities or effusions. No acute bony abnormality. IMPRESSION: No active cardiopulmonary disease. Electronically Signed   By: Charlett Nose M.D.   On: 10/20/2015 16:14    Follow up with PCP in 1 week.  Management plans discussed with the patient, family and they are in agreement.  CODE STATUS:     Code Status Orders        Start     Ordered   10/20/15 1801  Full code   Continuous     10/20/15 1800    Code Status History    Date Active  Date Inactive Code Status Order ID Comments User Context   This patient has a current code status but no historical code status.      TOTAL TIME TAKING CARE OF THIS PATIENT ON DAY OF DISCHARGE: more than 30 minutes.   Teana Lindahl R M.D on 10/21/2015 atMilagros Loll 3:43 PM  Between 7am to 6pm - Pager - [phone]  After 6pm go to [email or web address] - password [email or web address]  [facility]io [facility] [place] [facility]  Office  [phone]  CC: Primary care physician; Marikay AlarEric Sonnenberg, MD  Note: This dictation was prepared with Dragon dictation along with smaller phrase technology. Any transcriptional errors that result from this process are unintentional.

## 2015-10-22 ENCOUNTER — Telehealth: Payer: Self-pay | Admitting: *Deleted

## 2015-10-22 ENCOUNTER — Telehealth: Payer: Self-pay

## 2015-10-22 ENCOUNTER — Encounter: Payer: Self-pay | Admitting: *Deleted

## 2015-10-22 DIAGNOSIS — Z955 Presence of coronary angioplasty implant and graft: Secondary | ICD-10-CM

## 2015-10-22 NOTE — Telephone Encounter (Signed)
Called to follow up with transitional care management.  Patient is discharged home and states he is doing well.  Verbalized understanding as to why he was in the hospital, discharge instructions, diet, allergies and medications.  No immediate concerns at this time.  All referral appointments have been scheduled.  Appointment deferred with PCP at this time.  Patient states he would rather wait and call back at a later date.

## 2015-10-22 NOTE — Addendum Note (Signed)
Addended by: Virgina OrganENTERKIN, Dodie Parisi on: 10/22/2015 09:58 AM   Modules accepted: Orders

## 2015-10-22 NOTE — Telephone Encounter (Signed)
Michael BestHugh Ibe called and said the MD told him that he can start Cardiac Rehab. (It is typed in the recent hospitalization discharge summary. Cardiac Enzymes were negative It is documented "Dr. Mariah MillingGollan did not feel like this was angina" in Epic charting note.

## 2015-10-25 ENCOUNTER — Encounter: Payer: 59 | Attending: Cardiovascular Disease

## 2015-10-25 ENCOUNTER — Telehealth: Payer: Self-pay

## 2015-10-25 DIAGNOSIS — Z955 Presence of coronary angioplasty implant and graft: Secondary | ICD-10-CM

## 2015-10-25 DIAGNOSIS — Z9861 Coronary angioplasty status: Secondary | ICD-10-CM | POA: Diagnosis not present

## 2015-10-25 DIAGNOSIS — I213 ST elevation (STEMI) myocardial infarction of unspecified site: Secondary | ICD-10-CM | POA: Insufficient documentation

## 2015-10-25 LAB — GLUCOSE, CAPILLARY: Glucose-Capillary: 115 mg/dL — ABNORMAL HIGH (ref 65–99)

## 2015-10-25 NOTE — Progress Notes (Signed)
Daily Session Note  Patient Details  Name: Michael Jordan MRN: [id number] Date of Birth: 1956/01/03 Referring Provider:  Wellington Hampshire, MD  Encounter Date: 10/25/2015  Check In:     Session Check In - 10/25/15 1605    Check-In   Location [facility]   Staff Present Nyoka Cowden, RN;Carroll Enterkin, RN, BSN;Khi Mcmillen, BS, ACSM EP-C, Exercise Physiologist   Supervising physician immediately available to respond to emergencies See telemetry face sheet for immediately available ER MD   Medication changes reported     No   Fall or balance concerns reported    No   Warm-up and Cool-down Performed on first and last piece of equipment   Resistance Training Performed No   VAD Patient? No   Pain Assessment   Currently in Pain? No/denies         Goals Met:  Proper associated with RPD/PD & O2 Sat Exercise tolerated well No report of cardiac concerns or symptoms Strength training completed today  Goals Unmet:  Not Applicable  Comments:   Dr. Emily Filbert is Medical Director for [place] and [facility].

## 2015-10-26 NOTE — Telephone Encounter (Signed)
Called to follow up with how patient was doing and encourage to make a follow up with PCP within the week.  No answer.  No voicemail.

## 2015-10-27 ENCOUNTER — Encounter: Payer: 59 | Admitting: *Deleted

## 2015-10-27 ENCOUNTER — Encounter: Payer: Self-pay | Admitting: Dietician

## 2015-10-27 DIAGNOSIS — Z955 Presence of coronary angioplasty implant and graft: Secondary | ICD-10-CM

## 2015-10-27 DIAGNOSIS — I213 ST elevation (STEMI) myocardial infarction of unspecified site: Secondary | ICD-10-CM | POA: Diagnosis not present

## 2015-10-27 LAB — GLUCOSE, CAPILLARY
Glucose-Capillary: 122 mg/dL — ABNORMAL HIGH (ref 65–99)
Glucose-Capillary: 110 mg/dL — ABNORMAL HIGH (ref 65–99)

## 2015-10-27 NOTE — Progress Notes (Signed)
Daily Session Note  Patient Details  Name: Michael Jordan MRN: [id number] Date of Birth: 01-23-1956 Referring Provider:  [name], MD  Encounter Date: 10/27/2015  Check In:     Session Check In - 10/27/15 1608    Check-In   Location [facility]   Staff Present Roanna Epley, RN, BSN;Carroll Enterkin, RN, Alex Gardener, DPT, CEEA   Supervising physician immediately available to respond to emergencies See telemetry face sheet for immediately available ER MD   Medication changes reported     No   Fall or balance concerns reported    No   Warm-up and Cool-down Performed on first and last piece of equipment   Resistance Training Performed No   VAD Patient? No   Pain Assessment   Currently in Pain? No/denies   Pain Score 0-No pain         Goals Met:  Proper associated with RPD/PD & O2 Sat Exercise tolerated well No report of cardiac concerns or symptoms Strength training completed today  Goals Unmet:  Not Applicable  Comments:   Patient completed exercise prescription and all exercise goals during rehab session. The exercise was tolerated well and the patient is progressing in the program.   Dr. Emily Filbert is Medical Director for [place] and [facility].

## 2015-10-28 DIAGNOSIS — I213 ST elevation (STEMI) myocardial infarction of unspecified site: Secondary | ICD-10-CM | POA: Diagnosis not present

## 2015-10-28 DIAGNOSIS — Z955 Presence of coronary angioplasty implant and graft: Secondary | ICD-10-CM

## 2015-10-28 LAB — GLUCOSE, CAPILLARY
Glucose-Capillary: 148 mg/dL — ABNORMAL HIGH (ref 65–99)
Glucose-Capillary: 143 mg/dL — ABNORMAL HIGH (ref 65–99)

## 2015-10-28 NOTE — Progress Notes (Signed)
Daily Session Note  Patient Details  Name: Michael Jordan MRN: [id number] Date of Birth: 01/20/1956 Referring Provider:  Wellington Hampshire, MD  Encounter Date: 10/28/2015  Check In:     Session Check In - 10/28/15 1559    Check-In   Location [facility]   Staff Present Gerlene Burdock, RN, BSN;Alper Guilmette, BS, ACSM EP-C, Exercise Physiologist;Diane Joya Gaskins, RN, BSN   Supervising physician immediately available to respond to emergencies See telemetry face sheet for immediately available ER MD   Medication changes reported     No   Fall or balance concerns reported    No   Warm-up and Cool-down Performed on first and last piece of equipment   Resistance Training Performed No   VAD Patient? No   Pain Assessment   Currently in Pain? No/denies         Goals Met:  Proper associated with RPD/PD & O2 Sat Exercise tolerated well No report of cardiac concerns or symptoms Strength training completed today  Goals Unmet:  Not Applicable  Comments:    Dr. Emily Filbert is Medical Director for [place] and [facility].

## 2015-10-29 ENCOUNTER — Telehealth: Payer: Self-pay | Admitting: Cardiovascular Disease

## 2015-10-29 NOTE — Telephone Encounter (Signed)
Reviewed treadmill myoview instructions w/pt who verbalized understanding.  

## 2015-11-01 ENCOUNTER — Encounter: Payer: 59 | Admitting: *Deleted

## 2015-11-01 ENCOUNTER — Other Ambulatory Visit
Admission: RE | Admit: 2015-11-01 | Discharge: 2015-11-01 | Disposition: A | Discharge status: Home / Self Care | Payer: 59 | Source: Ambulatory Visit | Attending: Cardiovascular Disease | Admitting: Cardiovascular Disease

## 2015-11-01 ENCOUNTER — Encounter
Admission: RE | Admit: 2015-11-01 | Discharge: 2015-11-01 | Disposition: A | Discharge status: Home / Self Care | Payer: 59 | Source: Ambulatory Visit | Attending: Cardiovascular Disease | Admitting: Cardiovascular Disease

## 2015-11-01 DIAGNOSIS — I213 ST elevation (STEMI) myocardial infarction of unspecified site: Secondary | ICD-10-CM | POA: Diagnosis not present

## 2015-11-01 DIAGNOSIS — I2510 Atherosclerotic heart disease of native coronary artery without angina pectoris: Secondary | ICD-10-CM

## 2015-11-01 DIAGNOSIS — Z955 Presence of coronary angioplasty implant and graft: Secondary | ICD-10-CM

## 2015-11-01 LAB — HEPATIC FUNCTION PANEL
ALT: 31 U/L (ref 17–63)
AST: 41 U/L (ref 15–41)
Albumin: 4.1 g/dL (ref 3.5–5.0)
Alkaline Phosphatase: 67 U/L (ref 38–126)
Bilirubin, Direct: 0.1 mg/dL (ref 0.1–0.5)
Indirect Bilirubin: 0.5 mg/dL (ref 0.3–0.9)
Total Bilirubin: 0.6 mg/dL (ref 0.3–1.2)
Total Protein: 7.3 g/dL (ref 6.5–8.1)

## 2015-11-01 LAB — LIPID PANEL
Cholesterol: 118 mg/dL (ref 0–200)
HDL: 37 mg/dL — ABNORMAL LOW (ref >40)
LDL Cholesterol: 58 mg/dL (ref 0–99)
Total CHOL/HDL Ratio: 3.2 RATIO
Triglycerides: 115 mg/dL (ref <150)
VLDL: 23 mg/dL (ref 0–40)

## 2015-11-01 MED ORDER — TECHNETIUM TC 99M SESTAMIBI - CARDIOLITE
29.2800 | Freq: Once | INTRAVENOUS | Status: AC | PRN
  Administered 2015-11-01: 10:00:00 29.28 via INTRAVENOUS

## 2015-11-01 NOTE — Progress Notes (Signed)
Daily Session Note  Patient Details  Name: Michael Jordan MRN: [id number] Date of Birth: 08/30/1955 Referring Provider:  Wellington Hampshire, MD  Encounter Date: 11/01/2015  Check In:     Session Check In - 11/01/15 1621    Check-In   Location [facility]   Staff Present Nyoka Cowden, Mauricia Area, BS, ACSM CEP, Exercise Physiologist;Rebecca Brayton El, DPT, CEEA   Supervising physician immediately available to respond to emergencies See telemetry face sheet for immediately available ER MD   Medication changes reported     No   Fall or balance concerns reported    No   Warm-up and Cool-down Performed on first and last piece of equipment   Resistance Training Performed Yes   VAD Patient? No   Pain Assessment   Currently in Pain? No/denies   Multiple Pain Sites No         Goals Met:  Independence with exercise equipment Exercise tolerated well No report of cardiac concerns or symptoms Strength training completed today  Goals Unmet:  Not Applicable  Comments: Patient completed exercise prescription and all exercise goals during rehab session. The exercise was tolerated well and the patient is progressing in the program.     Dr. Emily Filbert is Medical Director for [place] and [facility].

## 2015-11-02 ENCOUNTER — Encounter
Admission: RE | Admit: 2015-11-02 | Discharge: 2015-11-02 | Disposition: A | Discharge status: Home / Self Care | Payer: 59 | Source: Ambulatory Visit | Attending: Cardiovascular Disease | Admitting: Cardiovascular Disease

## 2015-11-02 MED ORDER — TECHNETIUM TC 99M SESTAMIBI - CARDIOLITE
30.9000 | Freq: Once | INTRAVENOUS | Status: AC | PRN
  Administered 2015-11-02: 09:00:00 30.9 via INTRAVENOUS

## 2015-11-03 ENCOUNTER — Encounter: Payer: 59 | Admitting: *Deleted

## 2015-11-03 DIAGNOSIS — I213 ST elevation (STEMI) myocardial infarction of unspecified site: Secondary | ICD-10-CM | POA: Diagnosis not present

## 2015-11-03 DIAGNOSIS — Z955 Presence of coronary angioplasty implant and graft: Secondary | ICD-10-CM

## 2015-11-03 LAB — NM MYOCAR MULTI W/SPECT W/WALL MOTION / EF
Estimated workload: 7 METS
Exercise duration (min): 5 min
Exercise duration (sec): 30 s
LV dias vol: 65 mL (ref 62–150)
LV sys vol: 25 mL
MPHR: 160 {beats}/min
Peak HR: 148 {beats}/min
Percent HR: 92 %
Rest HR: 80 {beats}/min
SDS: 5
SRS: 19
SSS: 16
TID: 0.96

## 2015-11-03 NOTE — Progress Notes (Signed)
Daily Session Note  Patient Details  Name: EDGAR REISZ MRN: [id number] Date of Birth: 1956/02/10 Referring Provider:  Wellington Hampshire, MD  Encounter Date: 11/03/2015  Check In:     Session Check In - 11/03/15 1613    Check-In   Location [facility]   Staff Present Nyoka Cowden, RN;Carroll Enterkin, RN, Jana Half, RN, BSN   Supervising physician immediately available to respond to emergencies See telemetry face sheet for immediately available ER MD   Medication changes reported     No   Fall or balance concerns reported    No   Warm-up and Cool-down Performed on first and last piece of equipment   Resistance Training Performed Yes   VAD Patient? No   Pain Assessment   Currently in Pain? No/denies   Pain Score 0-No pain         Goals Met:  Independence with exercise equipment Exercise tolerated well No report of cardiac concerns or symptoms Strength training completed today  Goals Unmet:  Not Applicable  Comments:  Patient completed exercise prescription and all exercise goals during rehab session. The exercise was tolerated well and the patient is progressing in the program.   Dr. Emily Filbert is Medical Director for [place] and [facility].

## 2015-11-04 ENCOUNTER — Encounter: Payer: 59 | Admitting: *Deleted

## 2015-11-04 DIAGNOSIS — I213 ST elevation (STEMI) myocardial infarction of unspecified site: Secondary | ICD-10-CM | POA: Diagnosis not present

## 2015-11-04 DIAGNOSIS — Z955 Presence of coronary angioplasty implant and graft: Secondary | ICD-10-CM

## 2015-11-04 NOTE — Progress Notes (Signed)
Daily Session Note  Patient Details  Name: CHAPMAN MATTEUCCI MRN: [id number] Date of Birth: September 17, 1955 Referring Provider:    Encounter Date: 11/04/2015  Check In:     Session Check In - 11/04/15 1620    Check-In   Location [facility]   Staff Present Nyoka Cowden, RN;Carroll Enterkin, RN, Jana Half, RN, BSN   Supervising physician immediately available to respond to emergencies See telemetry face sheet for immediately available ER MD   Medication changes reported     No   Fall or balance concerns reported    No   Warm-up and Cool-down Performed on first and last piece of equipment   Resistance Training Performed Yes   VAD Patient? No   Pain Assessment   Currently in Pain? No/denies   Pain Score 0-No pain         Goals Met:  Proper associated with RPD/PD & O2 Sat Independence with exercise equipment Exercise tolerated well No report of cardiac concerns or symptoms Strength training completed today  Goals Unmet:  Not Applicable  Comments:  Patient completed exercise prescription and all exercise goals during rehab session. The exercise was tolerated well and the patient is progressing in the program   Dr. Emily Filbert is Medical Director for [place] and [facility].

## 2015-11-08 ENCOUNTER — Encounter: Payer: Self-pay | Admitting: Cardiovascular Disease

## 2015-11-08 ENCOUNTER — Other Ambulatory Visit: Payer: Self-pay

## 2015-11-08 DIAGNOSIS — I213 ST elevation (STEMI) myocardial infarction of unspecified site: Secondary | ICD-10-CM

## 2015-11-08 DIAGNOSIS — Z955 Presence of coronary angioplasty implant and graft: Secondary | ICD-10-CM

## 2015-11-08 DIAGNOSIS — Z01812 Encounter for preprocedural laboratory examination: Secondary | ICD-10-CM

## 2015-11-08 NOTE — Patient Instructions (Signed)
Your physician has requested that you have a cardiac catheterization. Cardiac catheterization is used to diagnose and/or treat various heart conditions. Doctors may recommend this procedure for a number of different reasons. The most common reason is to evaluate chest pain. Chest pain can be a symptom of coronary artery disease (CAD), and cardiac catheterization can show whether plaque is narrowing or blocking your heart's arteries. This procedure is also used to evaluate the valves, as well as measure the blood flow and oxygen levels in different parts of your heart. For further information please visit [email or web address] Please follow instruction sheet, as given.  [facility] Cardiac Cath Instructions   You are scheduled for a Cardiac Cath on: Thursday, April 20  Please arrive at  8:30am on the day of your procedure  You will need to pre-register prior to the day of your procedure.  Enter through the [employer] at [facility].  Registration is the first desk on your right.  Please take the procedure order we have given you in order to be registered appropriately  Do not eat/drink anything after midnight  Someone will need to drive you home  It is recommended someone be with you for the first 24 hours after your procedure  Wear clothes that are easy to get on/off and wear slip on shoes if possible   Medications bring a current list of all medications with you    _xx__ Do not take these medications before your procedure: Do not take metformin 24 hours before AND for 48 hours after your procedure.   Day of your procedure: Arrive at the [facility] entrance.  Free valet service is available.  After entering the [facility] please check-in at the registration desk (1st desk on your right) to receive your armband. After receiving your armband someone will escort you to the cardiac cath/special procedures waiting area.  The usual length of stay after your procedure is about 2 to 3 hours.  This can  vary.  If you have any questions, please call our office at [phone], or you may call the cardiac cath lab at [facility] directly at [phone] Angiogram An angiogram, also called angiography, is a procedure used to look at the blood vessels. In this procedure, dye is injected through a long, thin tube (catheter) into an artery. X-rays are then taken. The X-rays will show if there is a blockage or problem in a blood vessel.  LET [facility] CARE PROVIDER KNOW ABOUT:  Any allergies you have, including allergies to shellfish or contrast dye.   All medicines you are taking, including vitamins, herbs, eye drops, creams, and over-the-counter medicines.   Previous problems you or members of your family have had with the use of anesthetics.   Any blood disorders you have.   Previous surgeries you have had.  Any previous kidney problems or failure you have had.  Medical conditions you have.   Possibility of pregnancy, if this applies. RISKS AND COMPLICATIONS Generally, an angiogram is a safe procedure. However, as with any procedure, problems can occur. Possible problems include:  Injury to the blood vessels, including rupture or bleeding.  Infection or bruising at the catheter site.  Allergic reaction to the dye or contrast used.  Kidney damage from the dye or contrast used.  Blood clots that can lead to a stroke or heart attack. BEFORE THE PROCEDURE  Do not eat or drink after midnight on the night before the procedure, or as directed by your health care provider.   Ask  your health care provider if you may drink enough water to take any needed medicines the morning of the procedure.  PROCEDURE  You may be given a medicine to help you relax (sedative) before and during the procedure. This medicine is given through an IV access tube that is inserted into one of your veins.   The area where the catheter will be inserted will be washed and shaved. This is usually done in the  groin but may be done in the fold of your arm (near your elbow) or in the wrist.  A medicine will be given to numb the area where the catheter will be inserted (local anesthetic).  The catheter will be inserted with a guide wire into an artery. The catheter is guided by using a type of X-ray (fluoroscopy) to the blood vessel being examined.   Dye is then injected into the catheter, and X-rays are taken. The dye helps to show where any narrowing or blockages are located.  AFTER THE PROCEDURE   If the procedure is done through the leg, you will be kept in bed lying flat for several hours. You will be instructed to not bend or cross your legs.  The insertion site will be checked frequently.  The pulse in your feet or wrist will be checked frequently.  Additional blood tests, X-rays, and electrocardiography may be done.   You may need to stay in the hospital overnight for observation.    This information is not intended to replace advice given to you by your health care provider. Make sure you discuss any questions you have with your health care provider.   Document Released: 04/19/2005 Document Revised: 07/31/2014 Document Reviewed: 12/11/2012 Elsevier Interactive Patient Education 2016 Elsevier Inc. Angiogram, Care After These instructions give you information about caring for yourself after your procedure. Your doctor may also give you more specific instructions. Call your doctor if you have any problems or questions after your procedure.  HOME CARE  Take medicines only as told by your doctor.  Follow your doctor's instructions about:  Care of the area where the tube was inserted.  Bandage (dressing) changes and removal.  You may shower 24-48 hours after the procedure or as told by your doctor.  Do not take baths, swim, or use a hot tub until your doctor approves.  Every day, check the area where the tube was inserted. Watch for:  Redness, swelling, or pain.  Fluid,  blood, or pus.  Do not apply powder or lotion to the site.  Do not lift anything that is heavier than 10 lb (4.5 kg) for 5 days or as told by your doctor.  Ask your doctor when you can:  Return to work or school.  Do physical activities or play sports.  Have sex.  Do not drive or operate heavy machinery for 24 hours or as told by your doctor.  Have someone with you for the first 24 hours after the procedure.  Keep all follow-up visits as told by your doctor. This is important. GET HELP IF:  You have a fever.   You have chills.   You have more bleeding from the area where the tube was inserted. Hold pressure on the area.  You have redness, swelling, or pain in the area where the tube was inserted.  You have fluid or pus coming from the area. GET HELP RIGHT AWAY IF:   You have a lot of pain in the area where the tube was inserted.  The area where the tube was inserted is bleeding, and the bleeding does not stop after 30 minutes of holding steady pressure on the area.  The area near or just beyond the insertion site becomes pale, cool, tingly, or numb.   This information is not intended to replace advice given to you by your health care provider. Make sure you discuss any questions you have with your health care provider.   Document Released: 10/06/2008 Document Revised: 07/31/2014 Document Reviewed: 12/11/2012 Elsevier Interactive Patient Education [employer]

## 2015-11-08 NOTE — Progress Notes (Signed)
Daily Session Note  Patient Details  Name: DRAYSON DORKO MRN: [id number] Date of Birth: [birth year] Referring Provider:    Encounter Date: 11/08/2015  Check In:     Session Check In - 11/08/15 1617    Check-In   Location [facility]   Staff Present Nyoka Cowden, RN;Susanne Bice, RN, BSN, CCRP;Veronika Heard Brayton El, DPT, CEEA   Supervising physician immediately available to respond to emergencies See telemetry face sheet for immediately available ER MD   Medication changes reported     No   Fall or balance concerns reported    No   Warm-up and Cool-down Performed on first and last piece of equipment   Resistance Training Performed Yes   VAD Patient? No         Goals Met:  No report of cardiac concerns or symptoms Strength training completed today  Goals Unmet:  Not Applicable  Comments: Patient completed exercise prescription and all exercise goals during rehab session. The exercise was tolerated well and the patient is progressing in the program.    Dr. Emily Filbert is Medical Director for [place] and [facility].

## 2015-11-09 ENCOUNTER — Telehealth: Payer: Self-pay | Admitting: Cardiovascular Disease

## 2015-11-09 ENCOUNTER — Other Ambulatory Visit
Admission: RE | Admit: 2015-11-09 | Discharge: 2015-11-09 | Disposition: A | Discharge status: Home / Self Care | Payer: 59 | Source: Ambulatory Visit | Attending: Cardiovascular Disease | Admitting: Cardiovascular Disease

## 2015-11-09 DIAGNOSIS — Z01812 Encounter for preprocedural laboratory examination: Secondary | ICD-10-CM | POA: Insufficient documentation

## 2015-11-09 LAB — BASIC METABOLIC PANEL
Anion gap: 7 (ref 5–15)
BUN: 17 mg/dL (ref 6–20)
CO2: 24 mmol/L (ref 22–32)
Calcium: 9.1 mg/dL (ref 8.9–10.3)
Chloride: 105 mmol/L (ref 101–111)
Creatinine, Ser: 0.95 mg/dL (ref 0.61–1.24)
GFR calc Af Amer: >60 mL/min (ref >60)
GFR calc non Af Amer: >60 mL/min (ref >60)
Glucose, Bld: 147 mg/dL — ABNORMAL HIGH (ref 65–99)
Potassium: 4.2 mmol/L (ref 3.5–5.1)
Sodium: 136 mmol/L (ref 135–145)

## 2015-11-09 LAB — PROTIME-INR
INR: 1.06
Prothrombin Time: 14 seconds (ref 11.4–15.0)

## 2015-11-09 LAB — CBC
HCT: 41.3 % (ref 40.0–52.0)
Hemoglobin: 14.2 g/dL (ref 13.0–18.0)
MCH: 29 pg (ref 26.0–34.0)
MCHC: 34.3 g/dL (ref 32.0–36.0)
MCV: 84.5 fL (ref 80.0–100.0)
Platelets: 231 10*3/uL (ref 150–440)
RBC: 4.89 MIL/uL (ref 4.40–5.90)
RDW: 13.9 % (ref 11.5–14.5)
WBC: 8.8 10*3/uL (ref 3.8–10.6)

## 2015-11-09 NOTE — Telephone Encounter (Signed)
Left message on machine for patient to contact the office.   

## 2015-11-09 NOTE — Telephone Encounter (Signed)
Patient has some medications questions before his heart cath this Thursday. Please call patient.

## 2015-11-09 NOTE — Telephone Encounter (Signed)
S/w pt who asked to review medications he should hold prior to Thursday's cardiac cath. He understands to hold metformin 24 hour before and 48 hours after procedure. Pt verbalized understanding of instructions with no further questions.

## 2015-11-10 ENCOUNTER — Encounter: Payer: 59 | Admitting: *Deleted

## 2015-11-10 DIAGNOSIS — I213 ST elevation (STEMI) myocardial infarction of unspecified site: Secondary | ICD-10-CM

## 2015-11-10 DIAGNOSIS — Z955 Presence of coronary angioplasty implant and graft: Secondary | ICD-10-CM

## 2015-11-10 NOTE — Progress Notes (Signed)
Daily Session Note  Patient Details  Name: EHREN BERISHA MRN: [id number] Date of Birth: Jan 15, 1956 Referring Provider:    Encounter Date: 11/10/2015  Check In:     Session Check In - 11/10/15 1617    Check-In   Location [facility]   Staff Present Nyoka Cowden, RN;Dionte Blaustein, RN, Alex Gardener, DPT, CEEA   Supervising physician immediately available to respond to emergencies See telemetry face sheet for immediately available ER MD   Medication changes reported     No   Fall or balance concerns reported    No   Resistance Training Performed Yes   Pain Assessment   Currently in Pain? No/denies         Goals Met:  Proper associated with RPD/PD & O2 Sat Exercise tolerated well  Goals Unmet:  Not Applicable  Comments:    Dr. Emily Filbert is Medical Director for [place] and [facility].

## 2015-11-11 ENCOUNTER — Encounter
Admission: RE | Disposition: A | Discharge status: Home / Self Care | Payer: Self-pay | Source: Ambulatory Visit | Attending: Cardiovascular Disease

## 2015-11-11 ENCOUNTER — Ambulatory Visit
Admission: RE | Admit: 2015-11-11 | Discharge: 2015-11-11 | Disposition: A | Discharge status: Home / Self Care | Payer: 59 | Source: Ambulatory Visit | Attending: Cardiovascular Disease | Admitting: Cardiovascular Disease

## 2015-11-11 ENCOUNTER — Encounter: Payer: Self-pay | Admitting: *Deleted

## 2015-11-11 DIAGNOSIS — R9439 Abnormal result of other cardiovascular function study: Secondary | ICD-10-CM | POA: Insufficient documentation

## 2015-11-11 DIAGNOSIS — I2510 Atherosclerotic heart disease of native coronary artery without angina pectoris: Secondary | ICD-10-CM | POA: Diagnosis not present

## 2015-11-11 DIAGNOSIS — Z87891 Personal history of nicotine dependence: Secondary | ICD-10-CM | POA: Diagnosis not present

## 2015-11-11 DIAGNOSIS — Z79899 Other long term (current) drug therapy: Secondary | ICD-10-CM | POA: Insufficient documentation

## 2015-11-11 DIAGNOSIS — E785 Hyperlipidemia, unspecified: Secondary | ICD-10-CM | POA: Diagnosis not present

## 2015-11-11 DIAGNOSIS — Z6841 Body Mass Index (BMI) 40.0 and over, adult: Secondary | ICD-10-CM | POA: Diagnosis not present

## 2015-11-11 DIAGNOSIS — Z833 Family history of diabetes mellitus: Secondary | ICD-10-CM | POA: Insufficient documentation

## 2015-11-11 DIAGNOSIS — I255 Ischemic cardiomyopathy: Secondary | ICD-10-CM | POA: Diagnosis not present

## 2015-11-11 DIAGNOSIS — Z7982 Long term (current) use of aspirin: Secondary | ICD-10-CM | POA: Insufficient documentation

## 2015-11-11 HISTORY — PX: CARDIAC CATHETERIZATION: SHX172

## 2015-11-11 SURGERY — LEFT HEART CATH AND CORONARY ANGIOGRAPHY
Anesthesia: Moderate Sedation

## 2015-11-11 MED ORDER — ASPIRIN 81 MG PO CHEW
81.0000 mg | CHEWABLE_TABLET | ORAL | Status: AC
  Administered 2015-11-11: 81 mg via ORAL

## 2015-11-11 MED ORDER — SODIUM CHLORIDE 0.9% FLUSH: 3.0000 mL | Freq: Two times a day (BID) | INTRAVENOUS | Status: DC

## 2015-11-11 MED ORDER — VERAPAMIL HCL 2.5 MG/ML IV SOLN
INTRAVENOUS | Status: AC
  Filled 2015-11-11: qty 2

## 2015-11-11 MED ORDER — HEPARIN SODIUM (PORCINE) 1000 UNIT/ML IJ SOLN
INTRAMUSCULAR | Status: DC | PRN
  Administered 2015-11-11: 6000 [IU] via INTRAVENOUS

## 2015-11-11 MED ORDER — HEPARIN (PORCINE) IN NACL 2-0.9 UNIT/ML-% IJ SOLN
INTRAMUSCULAR | Status: AC
  Filled 2015-11-11: qty 500

## 2015-11-11 MED ORDER — ASPIRIN 81 MG PO CHEW
CHEWABLE_TABLET | ORAL | Status: AC
  Filled 2015-11-11: qty 1

## 2015-11-11 MED ORDER — FENTANYL CITRATE (PF) 100 MCG/2ML IJ SOLN
INTRAMUSCULAR | Status: AC
  Filled 2015-11-11: qty 2

## 2015-11-11 MED ORDER — IOPAMIDOL (ISOVUE-300) INJECTION 61%
INTRAVENOUS | Status: DC | PRN
  Administered 2015-11-11: 65 mL via INTRA_ARTERIAL

## 2015-11-11 MED ORDER — MIDAZOLAM HCL 2 MG/2ML IJ SOLN
INTRAMUSCULAR | Status: DC | PRN
  Administered 2015-11-11: 1 mg via INTRAVENOUS

## 2015-11-11 MED ORDER — FENTANYL CITRATE (PF) 100 MCG/2ML IJ SOLN
INTRAMUSCULAR | Status: DC | PRN
  Administered 2015-11-11: 25 ug via INTRAVENOUS

## 2015-11-11 MED ORDER — SODIUM CHLORIDE 0.9 % IV SOLN
INTRAVENOUS | Status: DC
  Administered 2015-11-11: 09:00:00 via INTRAVENOUS

## 2015-11-11 MED ORDER — HEPARIN SODIUM (PORCINE) 1000 UNIT/ML IJ SOLN
INTRAMUSCULAR | Status: AC
  Filled 2015-11-11: qty 1

## 2015-11-11 MED ORDER — SODIUM CHLORIDE 0.9 % IV SOLN: 250.0000 mL | INTRAVENOUS | Status: DC | PRN

## 2015-11-11 MED ORDER — VERAPAMIL HCL 2.5 MG/ML IV SOLN
INTRAVENOUS | Status: DC | PRN
  Administered 2015-11-11: 2.5 mg via INTRA_ARTERIAL

## 2015-11-11 MED ORDER — SODIUM CHLORIDE 0.9% FLUSH: 3.0000 mL | INTRAVENOUS | Status: DC | PRN

## 2015-11-11 MED ORDER — MIDAZOLAM HCL 2 MG/2ML IJ SOLN
INTRAMUSCULAR | Status: AC
  Filled 2015-11-11: qty 2

## 2015-11-11 SURGICAL SUPPLY — 6 items
CATH OPTITORQUE JACKY 4.0 5F (CATHETERS) ×2 IMPLANT
DEVICE RAD COMP TR BAND LRG (VASCULAR PRODUCTS) ×2 IMPLANT
GLIDESHEATH SLEND SS 6F .021 (SHEATH) ×2 IMPLANT
KIT MANI 3VAL PERCEP (MISCELLANEOUS) ×2 IMPLANT
PACK CARDIAC CATH (CUSTOM PROCEDURE TRAY) ×2 IMPLANT
WIRE SAFE-T 1.5MM-J .035X260CM (WIRE) ×2 IMPLANT

## 2015-11-11 NOTE — Discharge Instructions (Signed)
Resume Metformin in 2 days. Radial Site Care °Refer to this sheet in the next few weeks. These instructions provide you with information about caring for yourself after your procedure. Your health care provider may also give you more specific instructions. Your treatment has been planned according to current medical practices, but problems sometimes occur. Call your health care provider if you have any problems or questions after your procedure. °WHAT TO EXPECT AFTER THE PROCEDURE °After your procedure, it is typical to have the following: °· Bruising at the radial site that usually fades within 1-2 weeks. °· Blood collecting in the tissue (hematoma) that may be painful to the touch. It should usually decrease in size and tenderness within 1-2 weeks. °HOME CARE INSTRUCTIONS °· Take medicines only as directed by your health care provider. °· You may shower 24-48 hours after the procedure or as directed by your health care provider. Remove the bandage (dressing) and gently wash the site with plain soap and water. Pat the area dry with a clean towel. Do not rub the site, because this may cause bleeding. °· Do not take baths, swim, or use a hot tub until your health care provider approves. °· Check your insertion site every day for redness, swelling, or drainage. °· Do not apply powder or lotion to the site. °· Do not flex or bend the affected arm for 24 hours or as directed by your health care provider. °· Do not push or pull heavy objects with the affected arm for 24 hours or as directed by your health care provider. °· Do not lift over 10 lb (4.5 kg) for 5 days after your procedure or as directed by your health care provider. °· Ask your health care provider when it is okay to: °¨ Return to work or school. °¨ Resume usual physical activities or sports. °¨ Resume sexual activity. °· Do not drive home if you are discharged the same day as the procedure. Have someone else drive you. °· You may drive 24 hours after the  procedure unless otherwise instructed by your health care provider. °· Do not operate machinery or power tools for 24 hours after the procedure. °· If your procedure was done as an outpatient procedure, which means that you went home the same day as your procedure, a responsible adult should be with you for the first 24 hours after you arrive home. °· Keep all follow-up visits as directed by your health care provider. This is important. °SEEK MEDICAL CARE IF: °· You have a fever. °· You have chills. °· You have increased bleeding from the radial site. Hold pressure on the site. °SEEK IMMEDIATE MEDICAL CARE IF: °· You have unusual pain at the radial site. °· You have redness, warmth, or swelling at the radial site. °· You have drainage (other than a small amount of blood on the dressing) from the radial site. °· The radial site is bleeding, and the bleeding does not stop after 30 minutes of holding steady pressure on the site. °· Your arm or hand becomes pale, cool, tingly, or numb. °  °This information is not intended to replace advice given to you by your health care provider. Make sure you discuss any questions you have with your health care provider. °  °Document Released: 08/12/2010 Document Revised: 07/31/2014 Document Reviewed: 01/26/2014 °Elsevier Interactive Patient Education ©2016 Elsevier Inc. ° °

## 2015-11-11 NOTE — H&P (Signed)
Chief Complaint  Patient presents with  . other    [facility]; cardiac cath, s/p stent placement. Meds reviewed by the patient verbally. "doing well."      History of Present Illness: Michael Jordan is a 60 y.o. male who presents for A follow-up visit after recent hospitalization for anterior ST elevation myocardial infarction. He presented with acute onset chest pain and was found to have anterior ST elevation on his EKG this month [facility]. He underwent emergent cardiac catheterization which showed occluded proximal LAD with mild disease affecting the diagonals, chronically occluded right coronary artery with left to right collaterals and moderate left circumflex disease. He underwent successful angioplasty and drug-eluting stent placement in the proximal LAD. Ejection fraction was 40-45% by LV gram and 35-40% by echocardiogram. He did very well and was discharged on medical therapy. He has other chronic medical conditions that include morbid obesity, hypertension, hyperlipidemia and recent diagnosis of type 2 diabetes. He quit smoking in November 2016. Since hospital discharge, he has not had any substernal chest tightness. He does complain of some twinges and exertional dyspnea. No orthopnea, PND or leg edema. 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 He has been improving his diet significantly with the help with his wife. He reports taking all his medications with no side effects.   Past Medical History  Diagnosis Date  . CAD (coronary artery disease)     a. 09/2015 Ant STEMI/PCI: LM nl, LAD 100p (3.0x18 Xience Alpine DES), D1 40, SP1 40ost, D3 min irregs, LCX 8465m, OM1 nl, OM3 min irregs, RCA 80p/12413m (CTO)->fills via collats, EF 45-50%.  . Cardiomyopathy, ischemic     a. 09/2015 Echo: EF 35-40%, sev apical, periapical, antapical HK, mild to mod ant, inf HK, Gr1 DD.  . Morbid obesity (HCC)   . Tobacco abuse     a. Quit  05/2015.  Marland Kitchen. Hyperlipidemia     Past Surgical History  Procedure Laterality Date  . Tonsillectomy    . Cardiac catheterization N/A 09/30/2015    Procedure: Left Heart Cath and Coronary Angiography; Surgeon: Iran OuchMuhammad A Arida, MD; Location: [facility] INVASIVE CV LAB; Service: Cardiovascular; Laterality: N/A;  . Cardiac catheterization N/A 09/30/2015    Procedure: Coronary Stent Intervention; Surgeon: Iran OuchMuhammad A Arida, MD; Location: [facility] INVAS[facility] CV LAB; Service: Cardiovascular; Laterality: N/A;     Current Outpatient Prescriptions  Medication Sig Dispense Refill  . aspirin 81 MG chewable tablet Chew 1 tablet (81 mg total) by mouth daily.    Marland Kitchen. atorvastatin (LIPITOR) 80 MG tablet Take 1 tablet (80 mg total) by mouth daily at 6 PM. 30 tablet 6  . carvedilol (COREG) 3.125 MG tablet Take 1 tablet (3.125 mg total) by mouth 2 (two) times daily with a meal. 60 tablet 6  . lisinopril (PRINIVIL,ZESTRIL) 5 MG tablet Take 1 tablet (5 mg total) by mouth daily. 30 tablet 6  . metFORMIN (GLUCOPHAGE) 500 MG tablet Take 1 tablet (500 mg total) by mouth 2 (two) times daily with a meal. 60 tablet 3  . nitroGLYCERIN (NITROSTAT) 0.4 MG SL tablet Place 1 tablet (0.4 mg total) under the tongue every 5 (five) minutes as needed for chest pain. 25 tablet 3  . ticagrelor (BRILINTA) 90 MG TABS tablet Take 1 tablet (90 mg total) by mouth 2 (two) times daily. 60 tablet 6   No current facility-administered medications for this visit.    Allergies: Review of patient's allergies indicates no known  allergies.    Social History: The patient  reports that he quit smoking about 4 months ago. His smoking use included Cigarettes. He has a 22 pack-year smoking history. He does not have any smokeless tobacco history on file. He reports that he drinks alcohol. He reports that he does not use illicit drugs.   Family History: The patient's family history  includes Diabetes in his mother.    ROS: Please see the history of present illness. Otherwise, review of systems are positive for none. All other systems are reviewed and negative.    PHYSICAL EXAM: VS: BP 120/80 mmHg  Pulse 86  Ht  (1.803 m)  Wt 306 lb (138.801 kg)  BMI 42.70 kg/m2 , BMI Body mass index is 42.7 kg/(m^2). GEN: Well nourished, well developed, in no acute distress  HEENT: normal  Neck: no JVD, carotid bruits, or masses Cardiac: RRR; no murmurs, rubs, or gallops,no edema  Respiratory: clear to auscultation bilaterally, normal work of breathing GI: soft, nontender, nondistended, + BS MS: no deformity or atrophy  Skin: warm and dry, no rash Neuro: Strength and sensation are intact Psych: euthymic mood, full affect Right radial pulse is normal with no hematoma.  EKG: EKG is ordered today. The ekg ordered today demonstrates normal sinus rhythm with nonspecific anterior T wave changes.   Recent Labs: 10/01/2015: ALT 43; BUN 12; Creatinine, Ser 0.92; Hemoglobin 13.6; Platelets 206; Potassium 4.0; Sodium 135    Lipid Panel  Labs (Brief)       Component Value Date/Time   CHOL 214* 10/01/2015 0344   TRIG 234* 10/01/2015 0344   HDL 41 10/01/2015 0344   CHOLHDL 5.2 10/01/2015 0344   VLDL 47* 10/01/2015 0344   LDLCALC 126* 10/01/2015 0344       Wt Readings from Last 3 Encounters:  10/11/15 306 lb (138.801 kg)  09/30/15 315 lb 14.7 oz (143.3 kg)  10/22/14 286 lb (129.729 kg)        ASSESSMENT AND PLAN:  1. Status post anterior ST elevation myocardial infarction:    Status post drug-eluting stent placement to the LAD last month. The patient underwent a stress test before he can go back to work and was highly abnormal EKG changes. Thus, he was referred for repeat cardiac catheterization.  2. Hyperlipidemia: His LDL was 126 with a triglyceride of 234. Continue high dose atorvastatin. I requested fasting  lipid and liver profile.  3. Ischemic cardiomyopathy: Ejection fraction was mildly reduced at 40%. Continue small dose carvedilol and lisinopril. There is no evidence of volume overload.  4. Essential hypertension: Blood pressure is controlled on current medications.  5. Morbid obesity: We discussed the importance of diet and exercise.   6. Type 2 diabetes: Currently on small dose metformin. He was referred to a new primary care per his request.

## 2015-11-12 ENCOUNTER — Telehealth: Payer: Self-pay | Admitting: Cardiovascular Disease

## 2015-11-12 ENCOUNTER — Encounter: Payer: Self-pay | Admitting: Cardiovascular Disease

## 2015-11-12 NOTE — Telephone Encounter (Signed)
Patient's wife called regarding a return to work Physicist, medicalletter. She has specifics to give you regarding the letter. Please call Velna HatchetSheila #[phone].

## 2015-11-12 NOTE — Telephone Encounter (Signed)
Per verbal from Dr. Kirke CorinArida, pt may return to work April 25th, light duty, no more than 20 hours per week until f/u appt in June. S/w wife who states they will pick up letter Monday. Letter printed and placed at front desk for pick up

## 2015-11-14 ENCOUNTER — Encounter: Payer: Self-pay | Admitting: *Deleted

## 2015-11-14 DIAGNOSIS — Z955 Presence of coronary angioplasty implant and graft: Secondary | ICD-10-CM

## 2015-11-14 DIAGNOSIS — I213 ST elevation (STEMI) myocardial infarction of unspecified site: Secondary | ICD-10-CM

## 2015-11-14 NOTE — Progress Notes (Signed)
Cardiac Individual Treatment Plan  Patient Details  Name: Michael Jordan MRN: [id number] Date of Birth: [birth year] Referring Provider:    Initial Encounter Date:       [facility]   Date  -- Michael Jordan[Shamar Ange called- said the MD told him that he can start ]      Visit Diagnosis: Status post coronary artery stent placement  ST elevation myocardial infarction (STEMI), unspecified artery (HCC)  Patient's Home Medications on Admission:  Current outpatient prescriptions:  .  aspirin 81 MG chewable tablet, Chew 1 tablet (81 mg total) by mouth daily., Disp: , Rfl:  .  atorvastatin (LIPITOR) 80 MG tablet, Take 1 tablet (80 mg total) by mouth daily at 6 PM., Disp: 30 tablet, Rfl: 6 .  carvedilol (COREG) 3.125 MG tablet, Take 1 tablet (3.125 mg total) by mouth 2 (two) times daily with a meal., Disp: 60 tablet, Rfl: 6 .  lisinopril (PRINIVIL,ZESTRIL) 5 MG tablet, Take 1 tablet (5 mg total) by mouth daily., Disp: 30 tablet, Rfl: 6 .  metFORMIN (GLUCOPHAGE) 1000 MG tablet, Take 1 tablet (1,000 mg total) by mouth 2 (two) times daily with a meal., Disp: 180 tablet, Rfl: 3 .  nitroGLYCERIN (NITROSTAT) 0.4 MG SL tablet, Place 1 tablet (0.4 mg total) under the tongue every 5 (five) minutes as needed for chest pain., Disp: 25 tablet, Rfl: 3 .  spironolactone (ALDACTONE) 25 MG tablet, Take 0.5 tablets (12.5 mg total) by mouth daily., Disp: 30 tablet, Rfl: 0 .  ticagrelor (BRILINTA) 90 MG TABS tablet, Take 1 tablet (90 mg total) by mouth 2 (two) times daily., Disp: 60 tablet, Rfl: 6  Past Medical History: Past Medical History  Diagnosis Date  . CAD (coronary artery disease)     a. 09/2015 Ant STEMI/PCI: LM nl, LAD 100p (3.0x18 Xience Alpine DES), D1 40, SP1 40ost, D3 min irregs, LCX 6848m, OM1 nl, OM3 min irregs, RCA 80p/16625m (CTO)->fills via collats, EF 45-50%.  . Cardiomyopathy, ischemic     a. 09/2015 Echo: EF 35-40%, sev apical, periapical, antapical HK,  mild to mod ant, inf HK, Gr1 DD.  . Morbid obesity (HCC)   . Tobacco abuse     a. Quit 05/2015.  Marland Kitchen. Hyperlipidemia   . Diabetes (HCC)     Tobacco Use: History  Smoking status  . Former Smoker -- 0.50 packs/day for 44 years  . Types: Cigarettes  . Quit date: 06/02/2015  Smokeless tobacco  . Not on file    Labs: Recent Review Flowsheet Data    Labs for ITP Cardiac and Pulmonary Rehab Latest Ref Rng 10/01/2015 11/01/2015   Cholestrol 0 - 200 mg/dL 409(W214(H) 119118   LDLCALC 0 - 99 mg/dL 147(W126(H) 58   HDL >29>40 mg/dL 41 56(O37(L)   Trlycerides <150 mg/dL 130(Q234(H) 657115   Hemoglobin A1c 4.0 - 6.0 % 8.0(H) -       Exercise Target Goals:    Exercise Program Goal: Individual exercise prescription set with THRR, safety & activity barriers. Participant demonstrates ability to understand and report RPE using BORG scale, to self-measure pulse accurately, and to acknowledge the importance of the exercise prescription.  Exercise Prescription Goal: Starting with aerobic activity 30 plus minutes a day, 3 days per week for initial exercise prescription. Provide home exercise prescription and guidelines that participant acknowledges understanding prior to discharge.  Activity Barriers & Risk Stratification:     Activity Barriers & Cardiac Risk Stratification - 10/20/15 1635  Activity Barriers & Cardiac Risk Stratification   Activity Barriers Other (comment)   Comments Newly diagnosed Cardiac Patient   Cardiac Risk Stratification High      6 Minute Walk:     6 Minute Walk      10/20/15 1644       6 Minute Walk   Phase Initial     Distance 1360 feet     Walk Time 6 minutes     # of Rest Breaks 0     RPE 12     Symptoms Yes (comment)     Comments SOB, tingling in arms and legs, mild jaw pain. Was taken to the ER due to these symptoms     Resting HR 89 bpm     Resting BP 146/80 mmHg     Max Ex. HR 116 bpm     Max Ex. BP 158/82 mmHg        Initial Exercise Prescription:     Initial  Exercise Prescription - 10/22/15 0900    Date of Initial Exercise RX and Referring Provider   Date --  Michael Jordan called- said the MD told him that he can start       Perform Capillary Blood Glucose checks as needed.  Exercise Prescription Changes:     Exercise Prescription Changes      11/10/15 1600           Exercise Review   Progression Yes       Response to Exercise   Blood Pressure (Admit) 128/70 mmHg       Blood Pressure (Exercise) 160/80 mmHg       Blood Pressure (Exit) 124/70 mmHg       Heart Rate (Admit) 81 bpm       Heart Rate (Exercise) 98 bpm       Heart Rate (Exit) 87 bpm       Rating of Perceived Exertion (Exercise) 12       Symptoms No       Duration Progress to 45 minutes of aerobic exercise without signs/symptoms of physical distress       Intensity Rest + 30       Progression   Progression Continue to progress workloads to maintain intensity without signs/symptoms of physical distress.       Resistance Training   Training Prescription Yes       Weight 4       Reps 10-15       Interval Training   Interval Training No       Treadmill   MPH 1.5       Grade 2       Minutes 10       Recumbant Bike   Level 2       RPM 40       Watts 20       Minutes 15       NuStep   Level 2       Watts 30       Minutes 15       Arm Ergometer   Level 1       Watts 8       Minutes 10       Arm/Foot Ergometer   Level 4       Watts 12       Minutes 10       Recumbant Elliptical   Level 2       RPM 40  Watts 20       Minutes 15       REL-XR   Level 3       Watts 35       Minutes 15       T5 Nustep   Level 2       Watts 20       Minutes 15       Biostep-RELP   Level 3       Watts 20       Minutes 15          Exercise Comments:     Exercise Comments      11/10/15 1752 11/12/15 1336         Exercise Comments Michael Jordan reports that he is for a repeat heart cath in the am after he had an abnormal stress test. No chest pain during his stress  test he said and none in class/exercise time today.  Michael Jordan has attended [facility] on a regular basis.  He has experienced no difficulty during exercise sessions.  Goal will be to progress with program, taking into consideration outcome of cardiac cath.          Discharge Exercise Prescription (Final Exercise Prescription Changes):     Exercise Prescription Changes - 11/10/15 1600    Exercise Review   Progression Yes   Response to Exercise   Blood Pressure (Admit) 128/70 mmHg   Blood Pressure (Exercise) 160/80 mmHg   Blood Pressure (Exit) 124/70 mmHg   Heart Rate (Admit) 81 bpm   Heart Rate (Exercise) 98 bpm   Heart Rate (Exit) 87 bpm   Rating of Perceived Exertion (Exercise) 12   Symptoms No   Duration Progress to 45 minutes of aerobic exercise without signs/symptoms of physical distress   Intensity Rest + 30   Progression   Progression Continue to progress workloads to maintain intensity without signs/symptoms of physical distress.   Resistance Training   Training Prescription Yes   Weight 4   Reps 10-15   Interval Training   Interval Training No   Treadmill   MPH 1.5   Grade 2   Minutes 10   Recumbant Bike   Level 2   RPM 40   Watts 20   Minutes 15   NuStep   Level 2   Watts 30   Minutes 15   Arm Ergometer   Level 1   Watts 8   Minutes 10   Arm/Foot Ergometer   Level 4   Watts 12   Minutes 10   Recumbant Elliptical   Level 2   RPM 40   Watts 20   Minutes 15   REL-XR   Level 3   Watts 35   Minutes 15   T5 Nustep   Level 2   Watts 20   Minutes 15   Biostep-RELP   Level 3   Watts 20   Minutes 15      Nutrition:  Target Goals: Understanding of nutrition guidelines, daily intake of sodium 1500mg , cholesterol 200mg , calories 30% from fat and 7% or less from saturated fats, daily to have 5 or more servings of fruits and vegetables.  Biometrics:     Pre Biometrics - 10/20/15 1643    Pre Biometrics   Height 5\' 10"  (1.778 m)   Weight (!) 302  lb 11.2 oz (137.304 kg)   Waist Circumference 52.5 inches   Hip Circumference 56.5 inches   Waist to Hip Ratio 0.93 %  BMI (Calculated) 43.5       Nutrition Therapy Plan and Nutrition Goals:   Nutrition Discharge: Rate Your Plate Scores:     Nutrition Assessments - 10/27/15 1734    Rate Your Plate Scores   Pre Score 78   Pre Score % 87 %      Nutrition Goals Re-Evaluation:   Psychosocial: Target Goals: Acknowledge presence or absence of depression, maximize coping skills, provide positive support system. Participant is able to verbalize types and ability to use techniques and skills needed for reducing stress and depression.  Initial Review & Psychosocial Screening:     Initial Psych Review & Screening - 10/20/15 1646    Initial Review   Current issues with --  Patient denies being depressed.  Laughing, talkative, outgoing.  Speaking to others in the hallway when we walked through hallway.     Family Dynamics   Good Support System? Yes  Married x 25 years.  Has 2 children and 2 grand children.  Has church affiliation with [facility] [employer] [employer] [facility].     Barriers   Psychosocial barriers to participate in program There are no identifiable barriers or psychosocial needs.   Screening Interventions   Interventions Encouraged to exercise      Quality of Life Scores:     Quality of Life - 10/20/15 1800    Quality of Life Scores   Health/Function Pre 21.97 %   Socioeconomic Pre 27.43 %   Psych/Spiritual Pre 27.43 %   Family Pre 30 %   GLOBAL Pre 25.4 %      PHQ-9:     Recent Review Flowsheet Data    Depression screen [employer] 2/9 10/20/2015   Decreased Interest 0   Down, Depressed, Hopeless 0   PHQ - 2 Score 0   Altered sleeping 1   Tired, decreased energy 0   Change in appetite 0   Feeling bad or failure about yourself  0   Trouble concentrating 0   Moving slowly or fidgety/restless 0   Suicidal thoughts 0   PHQ-9 Score 1   Difficult doing work/chores  Somewhat difficult      Psychosocial Evaluation and Intervention:   Psychosocial Re-Evaluation:     Psychosocial Re-Evaluation      10/22/15 0956           Psychosocial Re-Evaluation   Interventions Encouraged to attend Cardiac Rehabilitation for the exercise       Comments Michael Jordan called and said the MD told him that he can start Cardiac Rehab. (It is typed in the recent hospitalization discharge summary. Cardiac Enzymes were negative It is documented "Dr. Mariah Milling did not feel like this was angina" in Epic charting note. Michael Jordan said he really wants to start Cardiac Rehab.           Vocational Rehabilitation: Provide vocational rehab assistance to qualifying candidates.   Vocational Rehab Evaluation & Intervention:     Vocational Rehab - 10/20/15 1639    Initial Vocational Rehab Evaluation & Intervention   Assessment shows need for Vocational Rehabilitation No      Education: Education Goals: Education classes will be provided on a weekly basis, covering required topics. Participant will state understanding/return demonstration of topics presented.  Learning Barriers/Preferences:     Learning Barriers/Preferences - 10/20/15 1637    Learning Barriers/Preferences   Learning Barriers None   Learning Preferences None      Education Topics: General Nutrition Guidelines/Fats and Fiber: -Group instruction provided by verbal, written material,  models and posters to present the general guidelines for heart healthy nutrition. Gives an explanation and review of dietary fats and fiber.          Cardiac Rehab from 11/10/2015 in [facility] Cardiac and Pulmonary Rehab   Date  10/25/15   Educator  PI   Instruction Review Code  2- meets goals/outcomes      Controlling Sodium/Reading Food Labels: -Group verbal and written material supporting the discussion of sodium use in heart healthy nutrition. Review and explanation with models, verbal and written materials for  utilization of the food label.      Cardiac Rehab from 11/10/2015 in [facility] Cardiac and Pulmonary Rehab   Date  11/01/15   Educator  PI   Instruction Review Code  2- meets goals/outcomes      Exercise Physiology & Risk Factors: - Group verbal and written instruction with models to review the exercise physiology of the cardiovascular system and associated critical values. Details cardiovascular disease risk factors and the goals associated with each risk factor.      Cardiac Rehab from 11/10/2015 in [facility] Cardiac and Pulmonary Rehab   Date  11/10/15   Educator  Rhett Bannister, PT   Instruction Review Code  2- meets goals/outcomes      Aerobic Exercise & Resistance Training: - Gives group verbal and written discussion on the health impact of inactivity. On the components of aerobic and resistive training programs and the benefits of this training and how to safely progress through these programs.   Flexibility, Balance, General Exercise Guidelines: - Provides group verbal and written instruction on the benefits of flexibility and balance training programs. Provides general exercise guidelines with specific guidelines to those with heart or lung disease. Demonstration and skill practice provided.   Stress Management: - Provides group verbal and written instruction about the health risks of elevated stress, cause of high stress, and healthy ways to reduce stress.   Depression: - Provides group verbal and written instruction on the correlation between heart/lung disease and depressed mood, treatment options, and the stigmas associated with seeking treatment.      Cardiac Rehab from 11/10/2015 in [facility] Cardiac and Pulmonary Rehab   Date  10/27/15   Educator  Belva Crome, [facility]   Instruction Review Code  2- meets goals/outcomes      Anatomy & Physiology of the Heart: - Group verbal and written instruction and models provide basic cardiac anatomy and physiology, with the coronary electrical and  arterial systems. Review of: AMI, Angina, Valve disease, Heart Failure, Cardiac Arrhythmia, Pacemakers, and the ICD.   Cardiac Procedures: - Group verbal and written instruction and models to describe the testing methods done to diagnose heart disease. Reviews the outcomes of the test results. Describes the treatment choices: Medical Management, Angioplasty, or Coronary Bypass Surgery.   Cardiac Medications: - Group verbal and written instruction to review commonly prescribed medications for heart disease. Reviews the medication, class of the drug, and side effects. Includes the steps to properly store meds and maintain the prescription regimen.   Go Sex-Intimacy & Heart Disease, Get SMART - Goal Setting: - Group verbal and written instruction through game format to discuss heart disease and the return to sexual intimacy. Provides group verbal and written material to discuss and apply goal setting through the application of the S.M.A.R.T. Method.   Other Matters of the Heart: - Provides group verbal, written materials and models to describe Heart Failure, Angina, Valve Disease, and Diabetes in the realm of heart  disease. Includes description of the disease process and treatment options available to the cardiac patient.      Cardiac Rehab from 11/10/2015 in [facility] Cardiac and Pulmonary Rehab   Date  11/08/15   Educator  SB   Instruction Review Code  2- meets goals/outcomes      Exercise & Equipment Safety: - Individual verbal instruction and demonstration of equipment use and safety with use of the equipment.      Cardiac Rehab from 11/10/2015 in [facility] Cardiac and Pulmonary Rehab   Date  10/20/15   Educator  D. Delford Field, RN   Instruction Review Code  1- partially meets, needs review/practice      Infection Prevention: - Provides verbal and written material to individual with discussion of infection control including proper hand washing and proper equipment cleaning during exercise  session.      Cardiac Rehab from 11/10/2015 in [facility] Cardiac and Pulmonary Rehab   Date  10/20/15   Educator  D. Delford Field, RN      Falls Prevention: - Provides verbal and written material to individual with discussion of falls prevention and safety.      Cardiac Rehab from 11/10/2015 in [facility] Cardiac and Pulmonary Rehab   Date  10/20/15   Educator  D. Delford Field, RN   Instruction Review Code  2- meets goals/outcomes      Diabetes: - Individual verbal and written instruction to review signs/symptoms of diabetes, desired ranges of glucose level fasting, after meals and with exercise. Advice that pre and post exercise glucose checks will be done for 3 sessions at entry of program.      Cardiac Rehab from 11/10/2015 in [facility] Cardiac and Pulmonary Rehab   Date  10/20/15   Educator  D. Delford Field, RN   Instruction Review Code  1- partially meets, needs review/practice       Knowledge Questionnaire Score:     Knowledge Questionnaire Score - 10/20/15 1637    Knowledge Questionnaire Score   Pre Score 22/28      Core Components/Risk Factors/Patient Goals at Admission:     Personal Goals and Risk Factors at Admission - 10/20/15 1641    Core Components/Risk Factors/Patient Goals on Admission    Weight Management Yes;Obesity   Intervention Weight Management/Obesity: Establish reasonable short term and long term weight goals.;Obesity: Provide education and appropriate resources to help participant work on and attain dietary goals.   Admit Weight 302 lb 11.2 oz (137.304 kg)   Goal Weight: Short Term 290 lb (131.543 kg)  Lose 1 - 2 pounds per week.     Goal Weight: Long Term 200 lb (90.719 kg)   Expected Outcomes Short Term: Continue to assess and modify interventions until short term weight is achieved;Long Term: Adherence to nutrition and physical activity/exercise program aimed toward attainment of established weight goal;Weight Loss: Understanding of general recommendations for a balanced deficit  meal plan, which promotes 1-2 lb weight loss per week and includes a negative energy balance of [phone] kcal/d;Understanding recommendations for meals to include 15-35% energy as protein, 25-35% energy from fat, 35-60% energy from carbohydrates, less than  of dietary cholesterol, 20-35 gm of total fiber daily;Understanding of distribution of calorie intake throughout the day with the consumption of 4-5 meals/snacks   Sedentary Yes   Intervention Provide advice, education, support and counseling about physical activity/exercise needs.;Develop an individualized exercise prescription for aerobic and resistive training based on initial evaluation findings, risk stratification, comorbidities and participant's personal goals.   Expected Outcomes Achievement of  increased cardiorespiratory fitness and enhanced flexibility, muscular endurance and strength shown through measurements of functional capacity and personal statement of participant.   Increase Strength and Stamina Yes   Intervention Provide advice, education, support and counseling about physical activity/exercise needs.;Develop an individualized exercise prescription for aerobic and resistive training based on initial evaluation findings, risk stratification, comorbidities and participant's personal goals.   Expected Outcomes Achievement of increased cardiorespiratory fitness and enhanced flexibility, muscular endurance and strength shown through measurements of functional capacity and personal statement of participant.   Diabetes Yes   Intervention Provide education about signs/symptoms and action to take for hypo/hyperglycemia.;Provide education about proper nutrition, including hydration, and aerobic/resistive exercise prescription along with prescribed medications to achieve blood glucose in normal ranges: Fasting glucose 65-99 mg/dL   Expected Outcomes Short Term: Participant verbalizes understanding of the signs/symptoms and immediate care of  hyper/hypoglycemia, proper foot care and importance of medication, aerobic/resistive exercise and nutrition plan for blood glucose control.;Long Term: Attainment of HbA1C < 7%.   Hypertension Yes   Intervention Provide education on lifestyle modifcations including regular physical activity/exercise, weight management, moderate sodium restriction and increased consumption of fresh fruit, vegetables, and low fat dairy, alcohol moderation, and smoking cessation.;Monitor prescription use compliance.   Expected Outcomes Short Term: Continued assessment and intervention until BP is < 140/20mm HG in hypertensive participants. < 130/66mm HG in hypertensive participants with diabetes, heart failure or chronic kidney disease.;Long Term: Maintenance of blood pressure at goal levels.   Lipids Yes   Intervention Provide education and support for participant on nutrition & aerobic/resistive exercise along with prescribed medications to achieve LDL 70mg , HDL >40mg .   Expected Outcomes Short Term: Participant states understanding of desired cholesterol values and is compliant with medications prescribed. Participant is following exercise prescription and nutrition guidelines.;Long Term: Cholesterol controlled with medications as prescribed, with individualized exercise RX and with personalized nutrition plan. Value goals: LDL < 70mg , HDL > 40 mg.      Core Components/Risk Factors/Patient Goals Review:      Goals and Risk Factor Review      11/10/15 1752           Core Components/Risk Factors/Patient Goals Review   Personal Goals Review Sedentary       Review Michael Jordan reports that he is for a repeat heart cath in the am after he had an abnormal stress test. No chest pain during his stress test he said and none in class/exercise time today.        Expected Outcomes To be able to return to Cardiac Rehab to exercise.           Core Components/Risk Factors/Patient Goals at Discharge (Final Review):      Goals  and Risk Factor Review - 11/10/15 1752    Core Components/Risk Factors/Patient Goals Review   Personal Goals Review Sedentary   Review Michael Jordan reports that he is for a repeat heart cath in the am after he had an abnormal stress test. No chest pain during his stress test he said and none in class/exercise time today.    Expected Outcomes To be able to return to Cardiac Rehab to exercise.       ITP Comments:     ITP Comments      10/22/15 0955 11/10/15 1751 11/14/15 1038       ITP Comments Michael Jordan called and said the MD told him that he can start Cardiac Rehab. (It is typed in the recent hospitalization discharge summary. Cardiac Enzymes were  negative It is documented "Dr. Mariah Milling did not feel like this was angina" in Epic charting note.  Michael Jordan reports that he is for a repeat heart cath in the am after he had an abnormal stress test. No chest pain during his stress test he said and none in class/exercise time today.  30 Day review. Continue with ITP  New to program        Comments:

## 2015-11-15 ENCOUNTER — Encounter: Payer: 59 | Admitting: *Deleted

## 2015-11-15 ENCOUNTER — Telehealth: Payer: Self-pay | Admitting: Cardiovascular Disease

## 2015-11-15 ENCOUNTER — Encounter: Payer: Self-pay | Admitting: Family Medicine

## 2015-11-15 ENCOUNTER — Ambulatory Visit (INDEPENDENT_AMBULATORY_CARE_PROVIDER_SITE_OTHER): Payer: 59 | Admitting: Family Medicine

## 2015-11-15 VITALS — BP 112/70 | HR 81 | Temp 98.4°F | Ht 70.0 in | Wt 301.8 lb

## 2015-11-15 DIAGNOSIS — E118 Type 2 diabetes mellitus with unspecified complications: Secondary | ICD-10-CM | POA: Diagnosis not present

## 2015-11-15 DIAGNOSIS — I213 ST elevation (STEMI) myocardial infarction of unspecified site: Secondary | ICD-10-CM

## 2015-11-15 DIAGNOSIS — Z955 Presence of coronary angioplasty implant and graft: Secondary | ICD-10-CM

## 2015-11-15 DIAGNOSIS — E785 Hyperlipidemia, unspecified: Secondary | ICD-10-CM

## 2015-11-15 NOTE — Progress Notes (Signed)
Patient ID: Michael RifeHugh L Dario, male   DOB: 08/30/1955, 60 y.o.   MRN: [id number]  Marikay AlarEric Irelyn Perfecto, MD Phone: [phone]  Michael Jordan is a 60 y.o. male who presents today for a follow up.  CAD: Patient had a recent STEMI and had stent placed. He had a follow-up stress test that did reveal abnormal findings and subsequently had a follow-up catheterization last week through his right wrist. Please see the catheter report for the results. Patient notes no chest pain, shortness of breath, diaphoresis, or palpitations. He states he was advised to have me look at his catheter site in his right wrist and if appearing well he could cancel his appointment with the PA at the cardiologist's office. He has no pain at the catheter site. He's not had any bleeding. He has no numbness or tingling in his hands. He does have minimal bruising. There is no hematoma or swelling.  DIABETES Disease Monitoring: Blood Sugar ranges-not checking Polyuria/phagia/dipsia- no      ophthalmology- has not ever had an eye exam Medications: Compliance- taking metformin 1000 TWICE daily Hypoglycemic symptoms- no  HYPERLIPIDEMIA Symptoms Chest pain on exertion:  No   Leg claudication:   No Medications: Compliance- taking Lipitor Right upper quadrant pain- no  Muscle aches- no  PMH: Former smoker   ROS see history of present illness  Objective  Physical Exam Filed Vitals:   11/15/15 0857  BP: 112/70  Pulse: 81  Temp: 98.4 F (36.9 C)    BP Readings from Last 3 Encounters:  11/15/15 112/70  11/11/15 127/69  10/21/15 143/98   Wt Readings from Last 3 Encounters:  11/15/15 301 lb 12.8 oz (136.896 kg)  11/11/15 299 lb (135.626 kg)  10/20/15 299 lb 12.8 oz (135.988 kg)    Physical Exam  Constitutional: He is well-developed, well-nourished, and in no distress.  HENT:  Head: Normocephalic and atraumatic.  Right Ear: External ear normal.  Left Ear: External ear normal.  Cardiovascular: Normal rate, regular  rhythm and normal heart sounds.   Right wrist with minimal bruising over the radial aspect where the catheterization was completed, there is a minimal scab over the insertion site, there is no hematoma or tenderness, there is no wrist swelling, 2+ radial pulse on the right and left  Pulmonary/Chest: Effort normal and breath sounds normal.  Neurological: He is alert. Gait normal.  5 out of 5 grip strength on the right  Skin: Skin is warm and dry. He is not diaphoretic.     Assessment/Plan: Please see individual problem list.  ST elevation myocardial infarction (STEMI) [facility](HCC) Patient is doing well. Asymptomatic. Doing well following catheterization last week. Catheterization site appears to be well-healing with minimal bruising and no evidence of hematoma formation. His radial pulse on that side is normal. I advised patient that he should call the cardiologist's office to inform them of this as he stated he would not necessarily need to be seen if the site looked good in our office. Continue cardiac rehabilitation. He'll continue current medications. He is given return precautions.  Hyperlipidemia Tolerating Lipitor. Had a significant reduction in LDL on last check. He'll continue Lipitor.  Type 2 diabetes mellitus with complication, without long-term current use of insulin (HCC) Tolerating metformin. Asymptomatic. He'll return in 2 months for an A1c.    Marikay AlarEric Pascha Fogal, MD [facility]

## 2015-11-15 NOTE — Telephone Encounter (Signed)
Per verbal from Dr. Kirke CorinArida, may change return to work date to May 2. Informed pt who states he will pick up letter Wednesday on his way to cardiac rehab. Pt had no further questions.

## 2015-11-15 NOTE — Patient Instructions (Signed)
Nice to see you. You're doing very well. Please contact the cardiologist's office and let them know about your cath access site appearing well today. Please continue your metformin and Lipitor. Please continue to modify your diet as you have been. If you develop chest pain, shortness of breath, swelling in her right wrist, bleeding, hematoma formation, numbness, tingling, weakness, or any new or changing symptoms please seek medical attention.

## 2015-11-15 NOTE — Assessment & Plan Note (Signed)
Tolerating metformin. Asymptomatic. He'll return in 2 months for an A1c.

## 2015-11-15 NOTE — Assessment & Plan Note (Signed)
Patient is doing well. Asymptomatic. Doing well following catheterization last week. Catheterization site appears to be well-healing with minimal bruising and no evidence of hematoma formation. His radial pulse on that side is normal. I advised patient that he should call the cardiologist's office to inform them of this as he stated he would not necessarily need to be seen if the site looked good in our office. Continue cardiac rehabilitation. He'll continue current medications. He is given return precautions.

## 2015-11-15 NOTE — Progress Notes (Signed)
Daily Session Note  Patient Details  Name: YASH CACCIOLA MRN: [id number] Date of Birth: [birth year] Referring Provider:    Encounter Date: 11/15/2015  Check In:     Session Check In - 11/15/15 1607    Check-In   Staff Present Heath Lark, RN, BSN, CCRP;Rebecca Sickles, DPT, [place] physician immediately available to respond to emergencies See telemetry face sheet for immediately available ER MD   Medication changes reported     No   Fall or balance concerns reported    No   Warm-up and Cool-down Performed on first and last piece of equipment   VAD Patient? No   Pain Assessment   Currently in Pain? No/denies         Goals Met:  Exercise tolerated well No report of cardiac concerns or symptoms  Goals Unmet:  Not Applicable  Comments: Doing well with exercise prescription progression.    Dr. Emily Filbert is Medical Director for [place] and [facility].

## 2015-11-15 NOTE — Telephone Encounter (Signed)
S/w pt who reports appt w/Dr. Antony HasteSonnenburg today. He had cardiac cath April 20.   Per notes:  "Catheterization site appears to be well-healing with minimal bruising and no evidence of hematoma formation. His radial pulse on that side is normal."   He was given a letter by Dr. Kirke CorinArida to return to work April 25, light duty, 20 hours per week until June appt. 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  He is asking for another letter with a May 2 return to work date.  Advised pt I will forward to Dr. Kirke CorinArida.

## 2015-11-15 NOTE — Telephone Encounter (Signed)
Patient wants a new letter as he was told not to lift heavy items and this is part of his routine.  Patient wants the letter to state return to work on 11/23/15.  Please call patient to discuss.

## 2015-11-15 NOTE — Assessment & Plan Note (Signed)
Tolerating Lipitor. Had a significant reduction in LDL on last check. He'll continue Lipitor.

## 2015-11-15 NOTE — Progress Notes (Signed)
Pre visit review using our clinic review tool, if applicable. No additional management support is needed unless otherwise documented below in the visit note. 

## 2015-11-17 ENCOUNTER — Encounter: Payer: 59 | Admitting: *Deleted

## 2015-11-17 DIAGNOSIS — I213 ST elevation (STEMI) myocardial infarction of unspecified site: Secondary | ICD-10-CM | POA: Diagnosis not present

## 2015-11-17 DIAGNOSIS — Z955 Presence of coronary angioplasty implant and graft: Secondary | ICD-10-CM

## 2015-11-17 NOTE — Progress Notes (Signed)
Daily Session Note  Patient Details  Name: CATHAN GEARIN MRN: [id number] Date of Birth: June 28, 1956 Referring Provider:    Encounter Date: 11/17/2015  Check In:     Session Check In - 11/17/15 1610    Check-In   Location [facility]   Staff Present Nyoka Cowden, RN;Susanne Bice, RN, BSN, CCRP;Terran Klinke Joya Gaskins, RN, BSN   Supervising physician immediately available to respond to emergencies See telemetry face sheet for immediately available ER MD   Medication changes reported     No   Fall or balance concerns reported    No   Warm-up and Cool-down Performed on first and last piece of equipment   Resistance Training Performed Yes   VAD Patient? No   Pain Assessment   Currently in Pain? No/denies         Goals Met:  Independence with exercise equipment Exercise tolerated well No report of cardiac concerns or symptoms Strength training completed today  Goals Unmet:  Not Applicable  Comments:  Patient completed exercise prescription and all exercise goals during rehab session. The exercise was tolerated well and the patient is progressing in the program.   Dr. Emily Filbert is Medical Director for [place] and [facility].

## 2015-11-18 ENCOUNTER — Encounter: Payer: 59 | Admitting: *Deleted

## 2015-11-18 DIAGNOSIS — I213 ST elevation (STEMI) myocardial infarction of unspecified site: Secondary | ICD-10-CM | POA: Diagnosis not present

## 2015-11-18 DIAGNOSIS — Z955 Presence of coronary angioplasty implant and graft: Secondary | ICD-10-CM

## 2015-11-18 NOTE — Progress Notes (Signed)
Daily Session Note  Patient Details  Name: Michael Jordan MRN: [id number] Date of Birth: 15-Jul-1956 Referring Provider:    Encounter Date: 11/18/2015  Check In:     Session Check In - 11/18/15 1658    Check-In   Location [facility]   Staff Present Nyoka Cowden, RN;Susanne Bice, RN, BSN, CCRP;Brandi Armato Joya Gaskins, RN, BSN   Supervising physician immediately available to respond to emergencies See telemetry face sheet for immediately available ER MD   Medication changes reported     No   Fall or balance concerns reported    No   Warm-up and Cool-down Performed on first and last piece of equipment   Resistance Training Performed Yes   VAD Patient? No   Pain Assessment   Currently in Pain? No/denies           Exercise Prescription Changes - 11/18/15 1600    Exercise Review   Progression Yes   Response to Exercise   Duration Progress to 45 minutes of aerobic exercise without signs/symptoms of physical distress   Intensity Rest + 30   Progression   Progression Continue to progress workloads to maintain intensity without signs/symptoms of physical distress.   Resistance Training   Training Prescription Yes   Weight 5   Reps 10-15   Interval Training   Interval Training No   Treadmill   MPH 1.5   Grade 2   Minutes 10   Recumbant Bike   Level 2   RPM 40   Watts 20   Minutes 15   NuStep   Level 2   Watts 30   Minutes 15   Arm Ergometer   Level 1   Watts 8   Minutes 10   Arm/Foot Ergometer   Level 4   Watts 12   Minutes 10   Recumbant Elliptical   Level 2   RPM 40   Watts 20   Minutes 15   REL-XR   Level 3   Watts 35   Minutes 15   T5 Nustep   Level 2   Watts 20   Minutes 15   Biostep-RELP   Level 3   Watts 20   Minutes 15      Goals Met:  Independence with exercise equipment Exercise tolerated well Personal goals reviewed No report of cardiac concerns or symptoms Strength training completed today  Goals Unmet:  Not  Applicable  Comments:  Patient completed exercise prescription and all exercise goals during rehab session. The exercise was tolerated well and the patient is progressing in the program.    Dr. Emily Filbert is Medical Director for [place] and [facility].

## 2015-11-22 ENCOUNTER — Encounter: Payer: 59 | Attending: Cardiovascular Disease

## 2015-11-22 ENCOUNTER — Telehealth: Payer: Self-pay | Admitting: *Deleted

## 2015-11-22 DIAGNOSIS — Z9861 Coronary angioplasty status: Secondary | ICD-10-CM | POA: Insufficient documentation

## 2015-11-22 DIAGNOSIS — I213 ST elevation (STEMI) myocardial infarction of unspecified site: Secondary | ICD-10-CM | POA: Insufficient documentation

## 2015-11-22 NOTE — Telephone Encounter (Signed)
Jena GaussHugh called to state he is sick and will not be in class today

## 2015-11-24 ENCOUNTER — Encounter: Payer: 59 | Admitting: *Deleted

## 2015-11-24 DIAGNOSIS — I213 ST elevation (STEMI) myocardial infarction of unspecified site: Secondary | ICD-10-CM | POA: Diagnosis not present

## 2015-11-24 DIAGNOSIS — Z9861 Coronary angioplasty status: Secondary | ICD-10-CM | POA: Diagnosis not present

## 2015-11-24 NOTE — Progress Notes (Signed)
Daily Session Note  Patient Details  Name: Michael Jordan MRN: [id number] Date of Birth: 1956/03/09 Referring Provider:    Encounter Date: 11/24/2015  Check In:     Session Check In - 11/24/15 1614    Check-In   Location [facility]   Staff Present Gerlene Burdock, RN, BSN;Diane Joya Gaskins, RN, Alex Gardener, DPT, CEEA   Supervising physician immediately available to respond to emergencies See telemetry face sheet for immediately available ER MD   Medication changes reported     No   Fall or balance concerns reported    No   Warm-up and Cool-down Performed on first and last piece of equipment   Resistance Training Performed Yes   VAD Patient? No   Pain Assessment   Currently in Pain? No/denies         Goals Met:  Proper associated with RPD/PD & O2 Sat Exercise tolerated well  Goals Unmet:  Not Applicable  Comments:    Dr. Emily Filbert is Medical Director for [place] and [facility].

## 2015-11-25 ENCOUNTER — Encounter: Payer: 59 | Admitting: *Deleted

## 2015-11-25 ENCOUNTER — Ambulatory Visit: Payer: 59 | Admitting: Nurse Practitioner

## 2015-11-25 DIAGNOSIS — I213 ST elevation (STEMI) myocardial infarction of unspecified site: Secondary | ICD-10-CM | POA: Diagnosis not present

## 2015-11-25 NOTE — Progress Notes (Signed)
Daily Session Note  Patient Details  Name: Michael Jordan MRN: [id number] Date of Birth: [birth year] Referring Provider:    Encounter Date: 11/25/2015  Check In:     Session Check In - 11/25/15 1708    Check-In   Location [facility]   Staff Present Gerlene Burdock, RN, BSN;Susanne Bice, RN, BSN, CCRP;Diane Joya Gaskins, RN, BSN   Supervising physician immediately available to respond to emergencies See telemetry face sheet for immediately available ER MD   Medication changes reported     No   Fall or balance concerns reported    No   Warm-up and Cool-down Performed on first and last piece of equipment   Resistance Training Performed Yes   VAD Patient? No   Pain Assessment   Currently in Pain? No/denies         Goals Met:  Proper associated with RPD/PD & O2 Sat Exercise tolerated well  Goals Unmet:  Not Applicable  Comments:    Dr. Emily Filbert is Medical Director for [place] and [facility].

## 2015-11-29 DIAGNOSIS — I213 ST elevation (STEMI) myocardial infarction of unspecified site: Secondary | ICD-10-CM

## 2015-11-29 DIAGNOSIS — Z955 Presence of coronary angioplasty implant and graft: Secondary | ICD-10-CM

## 2015-11-29 NOTE — Progress Notes (Signed)
Daily Session Note  Patient Details  Name: Michael Jordan MRN: [id number] Date of Birth: March 16, 1956 Referring Provider:    Encounter Date: 11/29/2015  Check In:     Session Check In - 11/29/15 1630    Check-In   Location [facility]   Staff Present Jeanell Sparrow, DPT, Tomi Bamberger, RN, BSN;Susanne Bice, RN, BSN, CCRP   Supervising physician immediately available to respond to emergencies See telemetry face sheet for immediately available ER MD   Medication changes reported     No   Fall or balance concerns reported    No   Warm-up and Cool-down Performed on first and last piece of equipment   Resistance Training Performed Yes   VAD Patient? No         Goals Met:  Independence with exercise equipment Exercise tolerated well No report of cardiac concerns or symptoms  Goals Unmet:  Not Applicable  Comments: Patient completed exercise prescription and all exercise goals during rehab session. The exercise was tolerated well and the patient is progressing in the program.    Dr. Emily Filbert is Medical Director for [place] and [facility].

## 2015-11-29 NOTE — Progress Notes (Signed)
Daily Session Note  Patient Details  Name: Michael Jordan MRN: [id number] Date of Birth: 28-Jan-1956 Referring Provider:    Encounter Date: 11/29/2015  Check In:     Session Check In - 11/29/15 1630    Check-In   Location [facility]   Staff Present Jeanell Sparrow, DPT, Tomi Bamberger, RN, BSN;Susanne Bice, RN, BSN, CCRP   Supervising physician immediately available to respond to emergencies See telemetry face sheet for immediately available ER MD   Medication changes reported     No   Fall or balance concerns reported    No   Warm-up and Cool-down Performed on first and last piece of equipment   Resistance Training Performed Yes   VAD Patient? No         Goals Met:  Independence with exercise equipment Exercise tolerated well No report of cardiac concerns or symptoms  Goals Unmet:  Not Applicable  Comments: Doing well with exercise prescription progression.    Dr. Emily Filbert is Medical Director for [place] and [facility].

## 2015-12-01 DIAGNOSIS — I213 ST elevation (STEMI) myocardial infarction of unspecified site: Secondary | ICD-10-CM | POA: Diagnosis not present

## 2015-12-01 DIAGNOSIS — Z955 Presence of coronary angioplasty implant and graft: Secondary | ICD-10-CM

## 2015-12-01 NOTE — Progress Notes (Signed)
Daily Session Note  Patient Details  Name: JULION GATT MRN: [id number] Date of Birth: Dec 17, 1955 Referring Provider:    Encounter Date: 12/01/2015  Check In:     Session Check In - 12/01/15 1719    Check-In   Location [facility]   Staff Present Carson Myrtle, BS, RRT, Respiratory Therapist;Carroll Enterkin, RN, Alex Gardener, DPT, CEEA   Supervising physician immediately available to respond to emergencies See telemetry face sheet for immediately available ER MD   Medication changes reported     No   Fall or balance concerns reported    No   Warm-up and Cool-down Performed on first and last piece of equipment   Resistance Training Performed Yes   VAD Patient? No         Goals Met:  Independence with exercise equipment Exercise tolerated well No report of cardiac concerns or symptoms Strength training completed today  Goals Unmet:  Not Applicable  Comments: Patient completed exercise prescription and all exercise goals during rehab session. The exercise was tolerated well and the patient is progressing in the program.    Dr. Emily Filbert is Medical Director for [place] and [facility].

## 2015-12-02 ENCOUNTER — Encounter: Payer: 59 | Admitting: *Deleted

## 2015-12-02 DIAGNOSIS — I213 ST elevation (STEMI) myocardial infarction of unspecified site: Secondary | ICD-10-CM

## 2015-12-02 NOTE — Progress Notes (Signed)
Daily Session Note  Patient Details  Name: RODEL GLASPY MRN: [id number] Date of Birth: 31-Jan-1956 Referring Provider:    Encounter Date: 12/02/2015  Check In:     Session Check In - 12/02/15 1659    Check-In   Location [facility]   Staff Present Gerlene Burdock, RN, Alex Gardener, DPT, Tomi Bamberger, RN, BSN   Supervising physician immediately available to respond to emergencies See telemetry face sheet for immediately available ER MD   Medication changes reported     No   Fall or balance concerns reported    No   Warm-up and Cool-down Performed on first and last piece of equipment   Resistance Training Performed Yes   VAD Patient? No   Pain Assessment   Currently in Pain? No/denies         Goals Met:  Proper associated with RPD/PD & O2 Sat Exercise tolerated well  Goals Unmet:  Not Applicable  Comments:     Dr. Emily Filbert is Medical Director for [place] and [facility].

## 2015-12-06 DIAGNOSIS — I213 ST elevation (STEMI) myocardial infarction of unspecified site: Secondary | ICD-10-CM | POA: Diagnosis not present

## 2015-12-06 DIAGNOSIS — Z955 Presence of coronary angioplasty implant and graft: Secondary | ICD-10-CM

## 2015-12-06 NOTE — Progress Notes (Signed)
Daily Session Note  Patient Details  Name: Michael Jordan MRN: [id number] Date of Birth: 1956-01-20 Referring Provider:    Encounter Date: 12/06/2015  Check In:     Session Check In - 12/06/15 1617    Check-In   Location [facility]   Staff Present Heath Lark, RN, BSN, CCRP;Apolonia Ellwood, DPT, Ronaldo Miyamoto, BS, ACSM CEP, Exercise Physiologist   Supervising physician immediately available to respond to emergencies See telemetry face sheet for immediately available ER MD   Medication changes reported     No   Fall or balance concerns reported    No   Warm-up and Cool-down Performed on first and last piece of equipment   Resistance Training Performed Yes   VAD Patient? No         Goals Met:  Independence with exercise equipment Exercise tolerated well No report of cardiac concerns or symptoms  Goals Unmet:  Not Applicable  Comments: Patient completed exercise prescription and all exercise goals during rehab session. The exercise was tolerated well and the patient is progressing in the program.    Dr. Emily Filbert is Medical Director for [place] and [facility].

## 2015-12-08 DIAGNOSIS — Z955 Presence of coronary angioplasty implant and graft: Secondary | ICD-10-CM

## 2015-12-08 DIAGNOSIS — I213 ST elevation (STEMI) myocardial infarction of unspecified site: Secondary | ICD-10-CM | POA: Diagnosis not present

## 2015-12-08 NOTE — Progress Notes (Signed)
Daily Session Note  Patient Details  Name: Michael Jordan MRN: [id number] Date of Birth: 01-24-1956 Referring Provider:    Encounter Date: 12/08/2015  Check In:     Session Check In - 12/08/15 1624    Check-In   Location [facility]   Staff Present Gerlene Burdock, RN, Alex Gardener, DPT, Tomi Bamberger, RN, BSN   Supervising physician immediately available to respond to emergencies See telemetry face sheet for immediately available ER MD   Medication changes reported     No   Fall or balance concerns reported    No   Warm-up and Cool-down Performed on first and last piece of equipment   Resistance Training Performed Yes   VAD Patient? No         Goals Met:  Independence with exercise equipment Exercise tolerated well No report of cardiac concerns or symptoms  Goals Unmet:  Not Applicable  Comments: Patient completed exercise prescription and all exercise goals during rehab session. The exercise was tolerated well and the patient is progressing in the program.    Dr. Emily Filbert is Medical Director for [place] and [facility].

## 2015-12-12 ENCOUNTER — Encounter: Payer: Self-pay | Admitting: *Deleted

## 2015-12-12 DIAGNOSIS — I213 ST elevation (STEMI) myocardial infarction of unspecified site: Secondary | ICD-10-CM

## 2015-12-12 DIAGNOSIS — Z955 Presence of coronary angioplasty implant and graft: Secondary | ICD-10-CM

## 2015-12-12 NOTE — Progress Notes (Signed)
Cardiac Individual Treatment Plan  Patient Details  Name: Michael Jordan MRN: [id number] Date of Birth: [birth year] Referring Provider:    Initial Encounter Date:       Documentation from 10/22/2015 in [facility] Cardiac and Pulmonary Rehab   Date  -- Michael Jordan called- said the MD told him that he can start ]      Visit Diagnosis: ST elevation myocardial infarction (STEMI), unspecified artery ([place])  Status post coronary artery stent placement  Patient's Home Medications on Admission:  Current outpatient prescriptions:  .  aspirin 81 MG chewable tablet, Chew 1 tablet (81 mg total) by mouth daily., Disp: , Rfl:  .  atorvastatin (LIPITOR) 80 MG tablet, Take 1 tablet (80 mg total) by mouth daily at 6 PM., Disp: 30 tablet, Rfl: 6 .  carvedilol (COREG) 3.125 MG tablet, Take 1 tablet (3.125 mg total) by mouth 2 (two) times daily with a meal., Disp: 60 tablet, Rfl: 6 .  lisinopril (PRINIVIL,ZESTRIL) 5 MG tablet, Take 1 tablet (5 mg total) by mouth daily., Disp: 30 tablet, Rfl: 6 .  metFORMIN (GLUCOPHAGE) 1000 MG tablet, Take 1 tablet (1,000 mg total) by mouth 2 (two) times daily with a meal., Disp: 180 tablet, Rfl: 3 .  nitroGLYCERIN (NITROSTAT) 0.4 MG SL tablet, Place 1 tablet (0.4 mg total) under the tongue every 5 (five) minutes as needed for chest pain., Disp: 25 tablet, Rfl: 3 .  spironolactone (ALDACTONE) 25 MG tablet, Take 0.5 tablets (12.5 mg total) by mouth daily., Disp: 30 tablet, Rfl: 0 .  ticagrelor (BRILINTA) 90 MG TABS tablet, Take 1 tablet (90 mg total) by mouth 2 (two) times daily., Disp: 60 tablet, Rfl: 6  Past Medical History: Past Medical History  Diagnosis Date  . CAD (coronary artery disease)     a. 09/2015 Ant STEMI/PCI: LM nl, LAD 100p (3.0x18 Xience Alpine DES), D1 40, SP1 40ost, D3 min irregs, LCX 44m OM1 nl, OM3 min irregs, RCA 80p/1069mCTO)->fills via collats, EF 45-50%.  . Cardiomyopathy, ischemic     a. 09/2015 Echo: EF 35-40%, sev apical, periapical, antapical HK,  mild to mod ant, inf HK, Gr1 DD.  . Morbid obesity ([place]  . Tobacco abuse     a. Quit 05/2015.  . Mar[place] Kitchenyperlipidemia   . Diabetes ([facility]    Tobacco Use: History  Smoking status  . Former Smoker -- 0.50 packs/day for 44 years  . Types: Cigarettes  . Quit date: 06/02/2015  Smokeless tobacco  . Not on file    Labs: Recent Review Flowsheet Data    Labs for ITP Cardiac and Pulmonary Rehab Latest Ref Rng 10/01/2015 11/01/2015   Cholestrol 0 - 200 mg/dL 214(H) 118   LDLCALC 0 - 99 mg/dL 126(H) 58   HDL >40 mg/dL 41 37(L)   Trlycerides <150 mg/dL 234(H) 115   Hemoglobin A1c 4.0 - 6.0 % 8.0(H) -       Exercise Target Goals:    Exercise Program Goal: Individual exercise prescription set with THRR, safety & activity barriers. Participant demonstrates ability to understand and report RPE using BORG scale, to self-measure pulse accurately, and to acknowledge the importance of the exercise prescription.  Exercise Prescription Goal: Starting with aerobic activity 30 plus minutes a day, 3 days per week for initial exercise prescription. Provide home exercise prescription and guidelines that participant acknowledges understanding prior to discharge.  Activity Barriers & Risk Stratification:     Activity Barriers & Cardiac Risk Stratification - 10/20/15 1635  Activity Barriers & Cardiac Risk Stratification   Activity Barriers Other (comment)   Comments Newly diagnosed Cardiac Patient   Cardiac Risk Stratification High      6 Minute Walk:     6 Minute Walk      10/20/15 1644       6 Minute Walk   Phase Initial     Distance 1360 feet     Walk Time 6 minutes     # of Rest Breaks 0     RPE 12     Symptoms Yes (comment)     Comments SOB, tingling in arms and legs, mild jaw pain. Was taken to the ER due to these symptoms     Resting HR 89 bpm     Resting BP 146/80 mmHg     Max Ex. HR 116 bpm     Max Ex. BP 158/82 mmHg        Initial Exercise Prescription:     Initial  Exercise Prescription - 10/22/15 0900    Date of Initial Exercise RX and Referring Provider   Date --  Michael Jordan called- said the MD told him that he can start       Perform Capillary Blood Glucose checks as needed.  Exercise Prescription Changes:     Exercise Prescription Changes      11/10/15 1600 11/18/15 1600 12/07/15 1400       Exercise Review   Progression Yes Yes Yes     Response to Exercise   Blood Pressure (Admit) 128/70 mmHg  114/74 mmHg     Blood Pressure (Exercise) 160/80 mmHg  148/84 mmHg     Blood Pressure (Exit) 124/70 mmHg  128/70 mmHg     Heart Rate (Admit) 81 bpm  95 bpm     Heart Rate (Exercise) 98 bpm  113 bpm     Heart Rate (Exit) 87 bpm  94 bpm     Rating of Perceived Exertion (Exercise) 12  13     Symptoms No  No     Duration Progress to 45 minutes of aerobic exercise without signs/symptoms of physical distress Progress to 45 minutes of aerobic exercise without signs/symptoms of physical distress Progress to 45 minutes of aerobic exercise without signs/symptoms of physical distress     Intensity Rest + 30 Rest + 30 Rest + 30     Progression   Progression Continue to progress workloads to maintain intensity without signs/symptoms of physical distress. Continue to progress workloads to maintain intensity without signs/symptoms of physical distress. Continue to progress workloads to maintain intensity without signs/symptoms of physical distress.     Resistance Training   Training Prescription Yes Yes Yes     Weight _0 Reps 10-15 10-15 10-15     Interval Training   Interval Training No No No     Treadmill   MPH 1.5 1.5 1.5     Grade _1 Minutes _2 Recumbant Bike   Level _3 RPM 40 40 40     Watts _4 Minutes _5 NuStep   Level _6 Watts _7 Minutes _8 Arm Ergometer   Level _9 Watts 8 8 8  Minutes _0 Arm/Foot Ergometer   Level _1 Watts _2 Minutes _3 Recumbant Elliptical   Level _4 RPM 40 40 40     Watts _5 Minutes _6 REL-XR   Level _7 Watts 35 35 35     Minutes _8 T5 Nustep   Level _9 Watts _10 Minutes _11 Biostep-RELP   Level _12 Watts _13 Minutes _14 Exercise Comments:     Exercise Comments      11/10/15 1752 11/12/15 1336 12/07/15 1438       Exercise Comments Michael Jordan reports that he is for a repeat heart cath in the am after he had an abnormal stress test. No chest pain during his stress test he said and none in class/exercise time today.  Michael Jordan has attended Heart Track on a regular basis.  He has experienced no difficulty during exercise sessions.  Goal will be to progress with program, taking into consideration outcome of cardiac cath.  Michael Jordan continues to attend program regularly and is progressing in tolerance to exercise without difficulty.  Goal for next month will be to transition to exercise program upon completion of Heart Track.        Discharge Exercise Prescription (Final Exercise Prescription Changes):     Exercise Prescription Changes - 12/07/15 1400    Exercise Review   Progression Yes   Response to Exercise   Blood Pressure (Admit) 114/74 mmHg   Blood Pressure (Exercise) 148/84 mmHg   Blood Pressure (Exit) 128/70 mmHg   Heart Rate (Admit) 95 bpm   Heart Rate (Exercise) 113 bpm   Heart Rate (Exit) 94 bpm   Rating of Perceived Exertion (Exercise) 13   Symptoms No   Duration Progress to 45 minutes of aerobic exercise without signs/symptoms of physical distress   Intensity Rest + 30   Progression   Progression Continue to progress workloads to maintain intensity without signs/symptoms of physical distress.   Resistance Training   Training Prescription Yes   Weight 5   Reps 10-15   Interval Training   Interval Training No   Treadmill   MPH 1.5   Grade 5   Minutes 15    Recumbant Bike   Level 2   RPM 40   Watts 20   Minutes 15   NuStep   Level 2   Watts 30   Minutes 15   Arm Ergometer   Level 1   Watts 8   Minutes 10   Arm/Foot Ergometer   Level 4   Watts 12   Minutes 10   Recumbant Elliptical   Level 2   RPM 40   Watts 20   Minutes 15   REL-XR   Level 3   Watts 35   Minutes 15   T5 Nustep   Level 2   Watts 20   Minutes 15   Biostep-RELP   Level 14   Watts 30   Minutes 15      Nutrition:  Target Goals: Understanding of nutrition guidelines, daily intake of sodium <1551m, cholesterol <2025m calories 30% from fat and 7% or less from saturated fats, daily to have 5 or more servings of fruits and vegetables.  Biometrics:     Pre Biometrics - 10/20/15 1643    Pre Biometrics   Height _0  (1.778 m)   Weight (!) 302 lb 11.2 oz (137.304 kg)   Waist Circumference 52.5 inches   Hip Circumference 56.5 inches   Waist to Hip Ratio 0.93 %   BMI (Calculated) 43.5       Nutrition Therapy Plan and Nutrition Goals:     Nutrition Therapy & Goals - 12/02/15 1825    Nutrition Therapy   Diet 1800kcal DASH   Protein (specify units) 8oz   Fiber 30 grams   Whole Grain Foods 3 servings   Saturated Fats 14 max. grams   Fruits and Vegetables 8 servings/day   Sodium 1500 grams   Personal Nutrition Goals   Personal Goal #1 continue with current eating pattern   Comments Patient is avoiding many starches to improve BG control. Advised high fiber, whole grain starches in controlled portions. Encouraged plant-based eating with small portions of lean meats and limited dairy fat.    Intervention Plan   Intervention Prescribe, educate and counsel regarding individualized specific dietary modifications aiming towards targeted core components such as weight, hypertension, lipid management, diabetes, heart failure and other comorbidities.;Nutrition handout(s) given to patient.   Expected Outcomes Short Term Goal: Understand basic principles of  dietary content, such as calories, fat, sodium, cholesterol and nutrients.;Short Term Goal: A plan has been developed with personal nutrition goals set during dietitian appointment.;Long Term Goal: Adherence to prescribed nutrition plan.      Nutrition Discharge: Rate Your Plate Scores:     Nutrition Assessments - 10/27/15 1734    Rate Your Plate Scores   Pre Score 78   Pre Score % 87 %      Nutrition Goals Re-Evaluation:   Psychosocial: Target Goals: Acknowledge presence or absence of depression, maximize coping skills, provide positive support system. Participant is able to verbalize types and ability to use techniques and skills needed for reducing stress and depression.  Initial Review & Psychosocial Screening:     Initial Psych Review & Screening - 10/20/15 1646    Initial Review   Current issues with --  Patient denies being depressed.  Laughing, talkative, outgoing.  Speaking to others in the hallway when we walked through hallway.     Family Dynamics   Good Support System? Yes  Married x 25 years.  Has 2 children and 2 grand children.  [facility]    Barriers   Psychosocial barriers to participate in program There are no identifiable barriers or psychosocial needs.   Screening Interventions   Interventions Encouraged to exercise      Quality of Life Scores:     Quality of Life - 10/20/15 1800    Quality of Life Scores   Health/Function Pre 21.97 %   Socioeconomic Pre 27.43 %   Psych/Spiritual Pre 27.43 %   Family Pre 30 %   GLOBAL Pre 25.4 %      PHQ-9:     Recent Review Flowsheet Data    [facility]   Decreased Interest 0   Down, Depressed, Hopeless 0   PHQ - 2 Score 0   Altered sleeping 1   Tired, decreased energy 0  Change in appetite 0   Feeling bad or failure about yourself  0   Trouble concentrating 0   Moving slowly or fidgety/restless 0   Suicidal thoughts 0   PHQ-9  Score 1   Difficult doing work/chores Somewhat difficult      Psychosocial Evaluation and Intervention:     Psychosocial Evaluation - 11/24/15 1702    Psychosocial Evaluation & Interventions   Interventions Stress management education;Encouraged to exercise with the program and follow exercise prescription   Comments Counselor met with Michael Jordan for initial psychosocial evaluation today.  He is a 60 year old who had a heart attack several months ago  He has a strong support system with  spouse of 26 years; (2) adult children who live close by as well as a mother-in-law.  Michael Jordan is also actively involved in his local church.  He reports sleeping well and having a diminished appetite since the cardiac procedure.  He denies a history of depression or anxiety or any current symptoms.  Michael Jordan states he is typically a positive person and has minimal stress in his life.  His goal for this program is to lose more weight - since he has lost 37 lbs so far, and begin a consistent exercise regimen to a healthier lifestyle.  Michael Jordan plans to walk outside and swim more in the future to maintain consistency.  He is encouraged to meet with the dietician to address his weight loss goals.        Psychosocial Re-Evaluation:     Psychosocial Re-Evaluation      10/22/15 0956           Psychosocial Re-Evaluation   Interventions Encouraged to attend Cardiac Rehabilitation for the exercise       Comments Michael Jordan called and said the MD told him that he can start Cardiac Rehab. (It is typed in the recent hospitalization discharge summary. Cardiac Enzymes were negative It is documented "Dr. Rockey Situ did not feel like this was angina" in Epic charting note. Michael Jordan said he really wants to start Cardiac Rehab.           Vocational Rehabilitation: Provide vocational rehab assistance to qualifying candidates.   Vocational Rehab Evaluation & Intervention:     Vocational Rehab - 10/20/15 1639     Initial Vocational Rehab Evaluation & Intervention   Assessment shows need for Vocational Rehabilitation No      Education: Education Goals: Education classes will be provided on a weekly basis, covering required topics. Participant will state understanding/return demonstration of topics presented.  Learning Barriers/Preferences:     Learning Barriers/Preferences - 10/20/15 1637    Learning Barriers/Preferences   Learning Barriers None   Learning Preferences None      Education Topics: General Nutrition Guidelines/Fats and Fiber: -Group instruction provided by verbal, written material, models and posters to present the general guidelines for heart healthy nutrition. Gives an explanation and review of dietary fats and fiber.          Cardiac Rehab from 12/08/2015 in [facility] Cardiac and Pulmonary Rehab   Date  10/25/15   Educator  PI   Instruction Review Code  2- meets goals/outcomes      Controlling Sodium/Reading Food Labels: -Group verbal and written material supporting the discussion of sodium use in heart healthy nutrition. Review and explanation with models, verbal and written materials for utilization of the food label.      Cardiac Rehab from 12/08/2015 in [facility] Cardiac and Pulmonary  Rehab   Date  11/01/15   Educator  PI   Instruction Review Code  2- meets goals/outcomes      Exercise Physiology & Risk Factors: - Group verbal and written instruction with models to review the exercise physiology of the cardiovascular system and associated critical values. Details cardiovascular disease risk factors and the goals associated with each risk factor.      Cardiac Rehab from 12/08/2015 in [facility] Cardiac and Pulmonary Rehab   Date  11/10/15   Educator  Salley Hews, PT   Instruction Review Code  2- meets goals/outcomes      Aerobic Exercise & Resistance Training: - Gives group verbal and written discussion on the health impact of inactivity. On the components of aerobic and  resistive training programs and the benefits of this training and how to safely progress through these programs.      Cardiac Rehab from 12/08/2015 in [facility] Cardiac and Pulmonary Rehab   Date  11/15/15   Educator  BS   Instruction Review Code  2- meets goals/outcomes      Flexibility, Balance, General Exercise Guidelines: - Provides group verbal and written instruction on the benefits of flexibility and balance training programs. Provides general exercise guidelines with specific guidelines to those with heart or lung disease. Demonstration and skill practice provided.      Cardiac Rehab from 12/08/2015 in [facility] Cardiac and Pulmonary Rehab   Date  11/17/15   Educator  S. Erman Thum, RN   Instruction Review Code  2- meets goals/outcomes      Stress Management: - Provides group verbal and written instruction about the health risks of elevated stress, cause of high stress, and healthy ways to reduce stress.      Cardiac Rehab from 12/08/2015 in [facility] Cardiac and Pulmonary Rehab   Date  11/24/15   Educator  Berle Mull, MSW   Instruction Review Code  2- meets goals/outcomes      Depression: - Provides group verbal and written instruction on the correlation between heart/lung disease and depressed mood, treatment options, and the stigmas associated with seeking treatment.      Cardiac Rehab from 12/08/2015 in [facility] Cardiac and Pulmonary Rehab   Date  10/27/15   Educator  Kathreen Cornfield, [facility]   Instruction Review Code  2- meets goals/outcomes      Anatomy & Physiology of the Heart: - Group verbal and written instruction and models provide basic cardiac anatomy and physiology, with the coronary electrical and arterial systems. Review of: AMI, Angina, Valve disease, Heart Failure, Cardiac Arrhythmia, Pacemakers, and the ICD.   Cardiac Procedures: - Group verbal and written instruction and models to describe the testing methods done to diagnose heart disease. Reviews the outcomes of the test results.  Describes the treatment choices: Medical Management, Angioplasty, or Coronary Bypass Surgery.      Cardiac Rehab from 12/08/2015 in [facility] Cardiac and Pulmonary Rehab   Date  11/29/15   Educator  SB   Instruction Review Code  2- meets goals/outcomes      Cardiac Medications: - Group verbal and written instruction to review commonly prescribed medications for heart disease. Reviews the medication, class of the drug, and side effects. Includes the steps to properly store meds and maintain the prescription regimen.      Cardiac Rehab from 12/08/2015 in [facility] Cardiac and Pulmonary Rehab   Date  12/08/15   Educator  DW   Instruction Review Code  2- meets goals/outcomes      Go  Sex-Intimacy & Heart Disease, Get SMART - Goal Setting: - Group verbal and written instruction through game format to discuss heart disease and the return to sexual intimacy. Provides group verbal and written material to discuss and apply goal setting through the application of the S.M.A.R.T. Method.      Cardiac Rehab from 12/08/2015 in [facility] Cardiac and Pulmonary Rehab   Date  11/29/15   Educator  SB   Instruction Review Code  2- meets goals/outcomes      Other Matters of the Heart: - Provides group verbal, written materials and models to describe Heart Failure, Angina, Valve Disease, and Diabetes in the realm of heart disease. Includes description of the disease process and treatment options available to the cardiac patient.      Cardiac Rehab from 12/08/2015 in [facility] Cardiac and Pulmonary Rehab   Date  11/08/15   Educator  SB   Instruction Review Code  2- meets goals/outcomes      Exercise & Equipment Safety: - Individual verbal instruction and demonstration of equipment use and safety with use of the equipment.      Cardiac Rehab from 12/08/2015 in [facility] Cardiac and Pulmonary Rehab   Date  10/20/15   Educator  D. Joya Gaskins, RN   Instruction Review Code  1- partially meets, needs review/practice      Infection  Prevention: - Provides verbal and written material to individual with discussion of infection control including proper hand washing and proper equipment cleaning during exercise session.      Cardiac Rehab from 12/08/2015 in [facility] Cardiac and Pulmonary Rehab   Date  10/20/15   Educator  D. Joya Gaskins, RN      Falls Prevention: - Provides verbal and written material to individual with discussion of falls prevention and safety.      Cardiac Rehab from 12/08/2015 in [facility] Cardiac and Pulmonary Rehab   Date  10/20/15   Educator  D. Joya Gaskins, RN   Instruction Review Code  2- meets goals/outcomes      Diabetes: - Individual verbal and written instruction to review signs/symptoms of diabetes, desired ranges of glucose level fasting, after meals and with exercise. Advice that pre and post exercise glucose checks will be done for 3 sessions at entry of program.      Cardiac Rehab from 12/08/2015 in [facility] Cardiac and Pulmonary Rehab   Date  10/20/15   Educator  D. Joya Gaskins, RN   Instruction Review Code  1- partially meets, needs review/practice       Knowledge Questionnaire Score:     Knowledge Questionnaire Score - 10/20/15 1637    Knowledge Questionnaire Score   Pre Score 22/28      Core Components/Risk Factors/Patient Goals at Admission:     Personal Goals and Risk Factors at Admission - 10/20/15 1641    Core Components/Risk Factors/Patient Goals on Admission    Weight Management Yes;Obesity   Intervention Weight Management/Obesity: Establish reasonable short term and long term weight goals.;Obesity: Provide education and appropriate resources to help participant work on and attain dietary goals.   Admit Weight 302 lb 11.2 oz (137.304 kg)   Goal Weight: Short Term 290 lb (131.543 kg)  Lose 1 - 2 pounds per week.     Goal Weight: Long Term 200 lb (90.719 kg)   Expected Outcomes Short Term: Continue to assess and modify interventions until short term weight is achieved;Long Term: Adherence to  nutrition and physical activity/exercise program aimed toward attainment of established weight goal;Weight Loss: Understanding  of general recommendations for a balanced deficit meal plan, which promotes 1-2 lb weight loss per week and includes a negative energy balance of [phone] kcal/d;Understanding recommendations for meals to include 15-35% energy as protein, 25-35% energy from fat, 35-60% energy from carbohydrates, less than 212m of dietary cholesterol, 20-35 gm of total fiber daily;Understanding of distribution of calorie intake throughout the day with the consumption of 4-5 meals/snacks   Sedentary Yes   Intervention Provide advice, education, support and counseling about physical activity/exercise needs.;Develop an individualized exercise prescription for aerobic and resistive training based on initial evaluation findings, risk stratification, comorbidities and participant's personal goals.   Expected Outcomes Achievement of increased cardiorespiratory fitness and enhanced flexibility, muscular endurance and strength shown through measurements of functional capacity and personal statement of participant.   Increase Strength and Stamina Yes   Intervention Provide advice, education, support and counseling about physical activity/exercise needs.;Develop an individualized exercise prescription for aerobic and resistive training based on initial evaluation findings, risk stratification, comorbidities and participant's personal goals.   Expected Outcomes Achievement of increased cardiorespiratory fitness and enhanced flexibility, muscular endurance and strength shown through measurements of functional capacity and personal statement of participant.   Diabetes Yes   Intervention Provide education about signs/symptoms and action to take for hypo/hyperglycemia.;Provide education about proper nutrition, including hydration, and aerobic/resistive exercise prescription along with prescribed medications to  achieve blood glucose in normal ranges: Fasting glucose 65-99 mg/dL   Expected Outcomes Short Term: Participant verbalizes understanding of the signs/symptoms and immediate care of hyper/hypoglycemia, proper foot care and importance of medication, aerobic/resistive exercise and nutrition plan for blood glucose control.;Long Term: Attainment of HbA1C < 7%.   Hypertension Yes   Intervention Provide education on lifestyle modifcations including regular physical activity/exercise, weight management, moderate sodium restriction and increased consumption of fresh fruit, vegetables, and low fat dairy, alcohol moderation, and smoking cessation.;Monitor prescription use compliance.   Expected Outcomes Short Term: Continued assessment and intervention until BP is < 140/931mHG in hypertensive participants. < 130/8045mG in hypertensive participants with diabetes, heart failure or chronic kidney disease.;Long Term: Maintenance of blood pressure at goal levels.   Lipids Yes   Intervention Provide education and support for participant on nutrition & aerobic/resistive exercise along with prescribed medications to achieve LDL <52m100mDL >40mg64mExpected Outcomes Short Term: Participant states understanding of desired cholesterol values and is compliant with medications prescribed. Participant is following exercise prescription and nutrition guidelines.;Long Term: Cholesterol controlled with medications as prescribed, with individualized exercise RX and with personalized nutrition plan. Value goals: LDL < 52mg,6m > 40 mg.      Core Components/Risk Factors/Patient Goals Review:      Goals and Risk Factor Review      11/10/15 1752           Core Components/Risk Factors/Patient Goals Review   Personal Goals Review Sedentary       Review Michael Jordan that he is for a repeat heart cath in the am after he had an abnormal stress test. No chest pain during his stress test he said and none in class/exercise time  today.        Expected Outcomes To be able to return to Cardiac Rehab to exercise.           Core Components/Risk Factors/Patient Goals at Discharge (Final Review):      Goals and Risk Factor Review - 11/10/15 1752    Core Components/Risk Factors/Patient Goals Review   Personal Goals Review Sedentary  Review Michael Jordan reports that he is for a repeat heart cath in the am after he had an abnormal stress test. No chest pain during his stress test he said and none in class/exercise time today.    Expected Outcomes To be able to return to Cardiac Rehab to exercise.       ITP Comments:     ITP Comments      10/22/15 0955 11/10/15 1751 11/14/15 1038 11/15/15 1607 12/12/15 1119   ITP Comments Michael Jordan called and said the MD told him that he can start Cardiac Rehab. (It is typed in the recent hospitalization discharge summary. Cardiac Enzymes were negative It is documented "Dr. Rockey Situ did not feel like this was angina" in Epic charting note.  Arleigh reports that he is for a repeat heart cath in the am after he had an abnormal stress test. No chest pain during his stress test he said and none in class/exercise time today.  30 Day review. Continue with ITP  New to program [facility] reports he had another cardiac cath done last week.  Reoprted to him, all was fine.  He will be decreasing his hours at work per MD prescription. 30 day review. Continue with ITP      Comments:

## 2015-12-13 ENCOUNTER — Other Ambulatory Visit: Payer: Self-pay

## 2015-12-13 DIAGNOSIS — Z955 Presence of coronary angioplasty implant and graft: Secondary | ICD-10-CM

## 2015-12-13 DIAGNOSIS — I213 ST elevation (STEMI) myocardial infarction of unspecified site: Secondary | ICD-10-CM | POA: Diagnosis not present

## 2015-12-13 MED ORDER — SPIRONOLACTONE 25 MG PO TABS: 12.5000 mg | ORAL_TABLET | Freq: Every day | ORAL | Status: DC

## 2015-12-13 NOTE — Progress Notes (Signed)
Daily Session Note  Patient Details  Name: Michael Jordan MRN: [id number] Date of Birth: [birth year] Referring Provider:    Encounter Date: 12/13/2015  Check In:     Session Check In - 12/13/15 1624    Check-In   Location [facility]   Staff Present Roanna Epley, RN, Vickki Hearing, BA, ACSM CEP, Exercise Physiologist;Kelly Amedeo Plenty, BS, ACSM CEP, Exercise Physiologist;Tennie Grussing Brayton El, DPT, CEEA   Supervising physician immediately available to respond to emergencies See telemetry face sheet for immediately available ER MD   Medication changes reported     No   Fall or balance concerns reported    No   Warm-up and Cool-down Performed on first and last piece of equipment   Resistance Training Performed Yes   VAD Patient? No         Goals Met:  Independence with exercise equipment Exercise tolerated well No report of cardiac concerns or symptoms  Goals Unmet:  Not Applicable  Comments: Patient completed exercise prescription and all exercise goals during rehab session. The exercise was tolerated well and the patient is progressing in the program.    Dr. Emily Filbert is Medical Director for [facility] and [facility].

## 2015-12-15 ENCOUNTER — Encounter: Payer: 59 | Admitting: *Deleted

## 2015-12-15 DIAGNOSIS — I213 ST elevation (STEMI) myocardial infarction of unspecified site: Secondary | ICD-10-CM | POA: Diagnosis not present

## 2015-12-15 DIAGNOSIS — Z955 Presence of coronary angioplasty implant and graft: Secondary | ICD-10-CM

## 2015-12-15 NOTE — Progress Notes (Signed)
Daily Session Note  Patient Details  Name: Michael Jordan MRN: [id number] Date of Birth: 05/16/1956 Referring Provider:    Encounter Date: 12/15/2015  Check In:     Session Check In - 12/15/15 1638    Check-In   Location [facility]   Staff Present Gerlene Burdock, RN, BSN;Rebecca Sickles, DPT, Burlene Arnt, BA, ACSM CEP, Exercise Physiologist;Romero Letizia Joya Gaskins, RN, BSN   Supervising physician immediately available to respond to emergencies See telemetry face sheet for immediately available ER MD   Medication changes reported     No   Fall or balance concerns reported    No   Warm-up and Cool-down Performed on first and last piece of equipment   Resistance Training Performed Yes   VAD Patient? No   Pain Assessment   Currently in Pain? No/denies           Exercise Prescription Changes - 12/15/15 1600    Exercise Review   Progression Yes   Response to Exercise   Duration Progress to 45 minutes of aerobic exercise without signs/symptoms of physical distress   Intensity THRR New  THRR 40 - 80% 64 - 128   Progression   Progression Continue to progress workloads to maintain intensity without signs/symptoms of physical distress.   Resistance Training   Training Prescription Yes   Weight 5   Reps 10-15   Interval Training   Interval Training No   Treadmill   MPH 1.5   Grade 5   Minutes 15   Recumbant Bike   Level 2   RPM 40   Watts 20   Minutes 15   NuStep   Level 2   Watts 30   Minutes 15   Arm Ergometer   Level 1   Watts 8   Minutes 10   Arm/Foot Ergometer   Level 4   Watts 12   Minutes 10   Recumbant Elliptical   Level 2   RPM 40   Watts 20   Minutes 15   REL-XR   Level 3   Watts 35   Minutes 15   T5 Nustep   Level 2   Watts 20   Minutes 15   Biostep-RELP   Level 14   Watts 30   Minutes 15      Goals Met:  Independence with exercise equipment Exercise tolerated well Personal goals reviewed No report of cardiac  concerns or symptoms Strength training completed today  Goals Unmet:  Not Applicable  Comments:  Patient completed exercise prescription and all exercise goals during rehab session. The exercise was tolerated well and the patient is progressing in the program.    Dr. Emily Filbert is Medical Director for [facility] and [facility].

## 2015-12-16 VITALS — Ht 70.0 in | Wt 294.0 lb

## 2015-12-16 DIAGNOSIS — I213 ST elevation (STEMI) myocardial infarction of unspecified site: Secondary | ICD-10-CM

## 2015-12-16 DIAGNOSIS — Z955 Presence of coronary angioplasty implant and graft: Secondary | ICD-10-CM

## 2015-12-16 NOTE — Progress Notes (Signed)
Daily Session Note  Patient Details  Name: TRAVEION RUDDOCK MRN: [id number] Date of Birth: [birth year] Referring Provider:    Encounter Date: 12/16/2015  Check In:     Session Check In - 12/16/15 1648    Check-In   Location [facility]   Staff Present Gerlene Burdock, RN, BSN;Craigory Toste, DPT, Burlene Arnt, BA, ACSM CEP, Exercise Physiologist;Diane Joya Gaskins, RN, BSN   Supervising physician immediately available to respond to emergencies See telemetry face sheet for immediately available ER MD   Medication changes reported     No   Fall or balance concerns reported    No   Warm-up and Cool-down Performed on first and last piece of equipment   Resistance Training Performed Yes   VAD Patient? No   Pain Assessment   Currently in Pain? No/denies   Multiple Pain Sites No         Goals Met:  Independence with exercise equipment Changing diet to healthy choices, watching portion sizes No report of cardiac concerns or symptoms Strength training completed today  Goals Unmet:  Not Applicable  Comments: Six minute walk repeated today   Dr. Emily Filbert is Medical Director for [facility] and [facility].

## 2015-12-16 NOTE — Patient Instructions (Signed)
Discharge Instructions  Patient Details  Name: Michael Jordan MRN: [id number] Date of Birth: January 18, 1956 Referring Provider:  [name], MD   Number of Visits: __  Reason for Discharge:  Patient reached a stable level of exercise. Patient independent in their exercise.  Smoking History:  History  Smoking status  . Former Smoker -- 0.50 packs/day for 44 years  . Types: Cigarettes  . Quit date: 06/02/2015  Smokeless tobacco  . Not on file    Diagnosis:  ST elevation myocardial infarction (STEMI), unspecified artery (HCC)  Status post coronary artery stent placement  Initial Exercise Prescription:     Initial Exercise Prescription - 10/22/15 0900    Date of Initial Exercise RX and Referring Provider   Date --  Carl Best called- said the MD told him that he can start       Discharge Exercise Prescription (Final Exercise Prescription Changes):     Exercise Prescription Changes - 12/16/15 1700    Home Exercise Plan   Plans to continue exercise at [facility] (comment)  [employer] or [employer], pt investigating options      Functional Capacity:     6 Minute Walk      10/20/15 1644 12/16/15 1650     6 Minute Walk   Phase Initial Discharge    Distance 1360 feet 1510 feet    Distance % Change  11 %    Walk Time 6 minutes     # of Rest Breaks 0     RPE 12 14    Symptoms Yes (comment)     Comments SOB, tingling in arms and legs, mild jaw pain. Was taken to the ER due to these symptoms     Resting HR 89 bpm 88 bpm    Resting BP 146/80 mmHg 144/78 mmHg    Max Ex. HR 116 bpm 134 bpm    Max Ex. BP 158/82 mmHg 160/72 mmHg       Quality of Life:     Quality of Life - 12/16/15 1806    Quality of Life Scores   Health/Function Pre 21.97 %   Health/Function Post 23.5 %   Health/Function % Change 6.96 %   Socioeconomic Pre 27.43 %   Socioeconomic Post 26.57 %   Socioeconomic % Change  -3.14 %   Psych/Spiritual Pre 27.43 %   Psych/Spiritual Post 28.8 %   Psych/Spiritual % Change 4.99 %   Family Pre 30 %   Family Post 28.8 %   Family % Change -4 %   GLOBAL Pre 25.4 %   GLOBAL Post 25.37 %   GLOBAL % Change -0.12 %      Personal Goals: Goals established at orientation with interventions provided to work toward goal.     Personal Goals and Risk Factors at Admission - 10/20/15 1641    Core Components/Risk Factors/Patient Goals on Admission    Weight Management Yes;Obesity   Intervention Weight Management/Obesity: Establish reasonable short term and long term weight goals.;Obesity: Provide education and appropriate resources to help participant work on and attain dietary goals.   Admit Weight 302 lb 11.2 oz (137.304 kg)   Goal Weight: Short Term 290 lb (131.543 kg)  Lose 1 - 2 pounds per week.     Goal Weight: Long Term 200 lb (90.719 kg)   Expected Outcomes Short Term: Continue to assess and modify interventions until short term weight is achieved;Long Term: Adherence to nutrition and physical activity/exercise program aimed toward attainment  of established weight goal;Weight Loss: Understanding of general recommendations for a balanced deficit meal plan, which promotes 1-2 lb weight loss per week and includes a negative energy balance of [phone] kcal/d;Understanding recommendations for meals to include 15-35% energy as protein, 25-35% energy from fat, 35-60% energy from carbohydrates, less than 200mg  of dietary cholesterol, 20-35 gm of total fiber daily;Understanding of distribution of calorie intake throughout the day with the consumption of 4-5 meals/snacks   Sedentary Yes   Intervention Provide advice, education, support and counseling about physical activity/exercise needs.;Develop an individualized exercise prescription for aerobic and resistive training based on initial evaluation findings, risk stratification, comorbidities and participant's personal goals.   Expected Outcomes Achievement of increased  cardiorespiratory fitness and enhanced flexibility, muscular endurance and strength shown through measurements of functional capacity and personal statement of participant.   Increase Strength and Stamina Yes   Intervention Provide advice, education, support and counseling about physical activity/exercise needs.;Develop an individualized exercise prescription for aerobic and resistive training based on initial evaluation findings, risk stratification, comorbidities and participant's personal goals.   Expected Outcomes Achievement of increased cardiorespiratory fitness and enhanced flexibility, muscular endurance and strength shown through measurements of functional capacity and personal statement of participant.   Diabetes Yes   Intervention Provide education about signs/symptoms and action to take for hypo/hyperglycemia.;Provide education about proper nutrition, including hydration, and aerobic/resistive exercise prescription along with prescribed medications to achieve blood glucose in normal ranges: Fasting glucose 65-99 mg/dL   Expected Outcomes Short Term: Participant verbalizes understanding of the signs/symptoms and immediate care of hyper/hypoglycemia, proper foot care and importance of medication, aerobic/resistive exercise and nutrition plan for blood glucose control.;Long Term: Attainment of HbA1C < 7%.   Hypertension Yes   Intervention Provide education on lifestyle modifcations including regular physical activity/exercise, weight management, moderate sodium restriction and increased consumption of fresh fruit, vegetables, and low fat dairy, alcohol moderation, and smoking cessation.;Monitor prescription use compliance.   Expected Outcomes Short Term: Continued assessment and intervention until BP is < 140/68mm HG in hypertensive participants. < 130/67mm HG in hypertensive participants with diabetes, heart failure or chronic kidney disease.;Long Term: Maintenance of blood pressure at goal  levels.   Lipids Yes   Intervention Provide education and support for participant on nutrition & aerobic/resistive exercise along with prescribed medications to achieve LDL 70mg , HDL >40mg .   Expected Outcomes Short Term: Participant states understanding of desired cholesterol values and is compliant with medications prescribed. Participant is following exercise prescription and nutrition guidelines.;Long Term: Cholesterol controlled with medications as prescribed, with individualized exercise RX and with personalized nutrition plan. Value goals: LDL < 70mg , HDL > 40 mg.       Personal Goals Discharge:     Goals and Risk Factor Review - 11/10/15 1752    Core Components/Risk Factors/Patient Goals Review   Personal Goals Review Sedentary   Review Zakiah reports that he is for a repeat heart cath in the am after he had an abnormal stress test. No chest pain during his stress test he said and none in class/exercise time today.    Expected Outcomes To be able to return to Cardiac Rehab to exercise.       Nutrition & Weight - Outcomes:     Pre Biometrics - 10/20/15 1643    Pre Biometrics   Height 5\' 10"  (1.778 m)   Weight (!) 302 lb 11.2 oz (137.304 kg)   Waist Circumference 52.5 inches   Hip Circumference 56.5 inches   Waist to Hip Ratio  0.93 %   BMI (Calculated) 43.5         Post Biometrics - 12/16/15 1649     Post  Biometrics   Height 5\' 10"  (1.778 m)   Weight 294 lb (133.358 kg)   Waist Circumference 52.5 inches   Hip Circumference 55 inches   Waist to Hip Ratio 0.95 %   BMI (Calculated) 42.3      Nutrition:     Nutrition Therapy & Goals - 12/02/15 1825    Nutrition Therapy   Diet 1800kcal DASH   Protein (specify units) 8oz   Fiber 30 grams   Whole Grain Foods 3 servings   Saturated Fats 14 max. grams   Fruits and Vegetables 8 servings/day   Sodium 1500 grams   Personal Nutrition Goals   Personal Goal #1 continue with current eating pattern   Comments Patient is  avoiding many starches to improve BG control. Advised high fiber, whole grain starches in controlled portions. Encouraged plant-based eating with small portions of lean meats and limited dairy fat.    Intervention Plan   Intervention Prescribe, educate and counsel regarding individualized specific dietary modifications aiming towards targeted core components such as weight, hypertension, lipid management, diabetes, heart failure and other comorbidities.;Nutrition handout(s) given to patient.   Expected Outcomes Short Term Goal: Understand basic principles of dietary content, such as calories, fat, sodium, cholesterol and nutrients.;Short Term Goal: A plan has been developed with personal nutrition goals set during dietitian appointment.;Long Term Goal: Adherence to prescribed nutrition plan.      Nutrition Discharge:     Nutrition Assessments - 12/16/15 1805    Rate Your Plate Scores   Pre Score 78   Pre Score % 87 %   Post Score 78   Post Score % 87 %   % Change 0 %      Education Questionnaire Score:     Knowledge Questionnaire Score - 12/16/15 1806    Knowledge Questionnaire Score   Pre Score 22/28   Post Score 25/28      Goals reviewed with patient; copy given to patient.

## 2015-12-22 ENCOUNTER — Encounter: Payer: 59 | Admitting: *Deleted

## 2015-12-22 DIAGNOSIS — Z955 Presence of coronary angioplasty implant and graft: Secondary | ICD-10-CM

## 2015-12-22 DIAGNOSIS — I213 ST elevation (STEMI) myocardial infarction of unspecified site: Secondary | ICD-10-CM | POA: Diagnosis not present

## 2015-12-22 NOTE — Progress Notes (Signed)
Daily Session Note  Patient Details  Name: Michael Jordan MRN: [id number] Date of Birth: 1956-02-06 Referring Provider:    Encounter Date: 12/22/2015  Check In:     Session Check In - 12/22/15 1808    Check-In   Location [facility]   Staff Present Gerlene Burdock, RN, Vickki Hearing, BA, ACSM CEP, Exercise Physiologist;Cordae Mccarey Joya Gaskins, RN, BSN   Supervising physician immediately available to respond to emergencies See telemetry face sheet for immediately available ER MD   Medication changes reported     No   Fall or balance concerns reported    No   Warm-up and Cool-down Performed on first and last piece of equipment   Resistance Training Performed Yes   VAD Patient? No   Pain Assessment   Currently in Pain? No/denies         Goals Met:  Independence with exercise equipment Exercise tolerated well No report of cardiac concerns or symptoms Strength training completed today  Goals Unmet:  Not Applicable  Comments:   Pt able to follow exercise prescription today without complaint.  Will continue to monitor for progression.    Dr. Emily Filbert is Medical Director for [facility].

## 2015-12-23 ENCOUNTER — Encounter: Payer: 59 | Attending: Cardiovascular Disease

## 2015-12-23 DIAGNOSIS — I213 ST elevation (STEMI) myocardial infarction of unspecified site: Secondary | ICD-10-CM

## 2015-12-23 DIAGNOSIS — Z9861 Coronary angioplasty status: Secondary | ICD-10-CM | POA: Diagnosis not present

## 2015-12-23 DIAGNOSIS — Z955 Presence of coronary angioplasty implant and graft: Secondary | ICD-10-CM

## 2015-12-23 NOTE — Progress Notes (Signed)
Daily Session Note  Patient Details  Name: Michael Jordan MRN: [id number] Date of Birth: 11/29/1955 Referring Provider:    Encounter Date: 12/23/2015  Check In:     Session Check In - 12/23/15 1651    Check-In   Location [facility]   Staff Present Gerlene Burdock, RN, Vickki Hearing, BA, ACSM CEP, Exercise Physiologist;Diane Joya Gaskins, RN, BSN   Supervising physician immediately available to respond to emergencies See telemetry face sheet for immediately available ER MD   Medication changes reported     No   Fall or balance concerns reported    No   Warm-up and Cool-down Performed on first and last piece of equipment   Resistance Training Performed Yes   VAD Patient? No   Pain Assessment   Currently in Pain? No/denies   Multiple Pain Sites No         Goals Met:  Independence with exercise equipment Exercise tolerated well No report of cardiac concerns or symptoms Strength training completed today  Goals Unmet:  Not Applicable  Comments: Michael Jordan graduates from [employer] today.  He completed 36/36 sessions and plans on attending [employer] with his son.   Dr. Emily Filbert is Medical Director for [place] [facility] and [facility].

## 2015-12-23 NOTE — Progress Notes (Signed)
Cardiac Individual Treatment Plan  Patient Details  Name: Michael Jordan MRN: [id number] Date of Birth: July 22, 1956 Referring Provider:    Initial Encounter Date:       Documentation from 10/22/2015 in [facility] Cardiac and Pulmonary Rehab   Date  -- Irven Coe called- said the MD told him that he can start ]      Visit Diagnosis: ST elevation myocardial infarction (STEMI), unspecified artery ([place])  Status post coronary artery stent placement  Patient's Home Medications on Admission:  Current outpatient prescriptions:  .  aspirin 81 MG chewable tablet, Chew 1 tablet (81 mg total) by mouth daily., Disp: , Rfl:  .  atorvastatin (LIPITOR) 80 MG tablet, Take 1 tablet (80 mg total) by mouth daily at 6 PM., Disp: 30 tablet, Rfl: 6 .  carvedilol (COREG) 3.125 MG tablet, Take 1 tablet (3.125 mg total) by mouth 2 (two) times daily with a meal., Disp: 60 tablet, Rfl: 6 .  lisinopril (PRINIVIL,ZESTRIL) 5 MG tablet, Take 1 tablet (5 mg total) by mouth daily., Disp: 30 tablet, Rfl: 6 .  metFORMIN (GLUCOPHAGE) 1000 MG tablet, Take 1 tablet (1,000 mg total) by mouth 2 (two) times daily with a meal., Disp: 180 tablet, Rfl: 3 .  nitroGLYCERIN (NITROSTAT) 0.4 MG SL tablet, Place 1 tablet (0.4 mg total) under the tongue every 5 (five) minutes as needed for chest pain., Disp: 25 tablet, Rfl: 3 .  spironolactone (ALDACTONE) 25 MG tablet, Take 0.5 tablets (12.5 mg total) by mouth daily., Disp: 30 tablet, Rfl: 6 .  ticagrelor (BRILINTA) 90 MG TABS tablet, Take 1 tablet (90 mg total) by mouth 2 (two) times daily., Disp: 60 tablet, Rfl: 6  Past Medical History: Past Medical History  Diagnosis Date  . CAD (coronary artery disease)     a. 09/2015 Ant STEMI/PCI: LM nl, LAD 100p (3.0x18 Xience Alpine DES), D1 40, SP1 40ost, D3 min irregs, LCX 34m OM1 nl, OM3 min irregs, RCA 80p/1073mCTO)->fills via collats, EF 45-50%.  . Cardiomyopathy, ischemic     a. 09/2015 Echo: EF 35-40%, sev apical, periapical, antapical HK,  mild to mod ant, inf HK, Gr1 DD.  . Morbid obesity ([facility]  . Tobacco abuse     a. Quit 05/2015.  . Marland Kitchenyperlipidemia   . Diabetes ([facility]    Tobacco Use: History  Smoking status  . Former Smoker -- 0.50 packs/day for 44 years  . Types: Cigarettes  . Quit date: 06/02/2015  Smokeless tobacco  . Not on file    Labs: Recent Review Flowsheet Data    Labs for ITP Cardiac and Pulmonary Rehab Latest Ref Rng 10/01/2015 11/01/2015   Cholestrol 0 - 200 mg/dL 214(H) 118   LDLCALC 0 - 99 mg/dL 126(H) 58   HDL >40 mg/dL 41 37(L)   Trlycerides <150 mg/dL 234(H) 115   Hemoglobin A1c 4.0 - 6.0 % 8.0(H) -       Exercise Target Goals:    Exercise Program Goal: Individual exercise prescription set with THRR, safety & activity barriers. Participant demonstrates ability to understand and report RPE using BORG scale, to self-measure pulse accurately, and to acknowledge the importance of the exercise prescription.  Exercise Prescription Goal: Starting with aerobic activity 30 plus minutes a day, 3 days per week for initial exercise prescription. Provide home exercise prescription and guidelines that participant acknowledges understanding prior to discharge.  Activity Barriers & Risk Stratification:     Activity Barriers & Cardiac Risk Stratification - 10/20/15 1635  Activity Barriers & Cardiac Risk Stratification   Activity Barriers Other (comment)   Comments Newly diagnosed Cardiac Patient   Cardiac Risk Stratification High      6 Minute Walk:     6 Minute Walk      10/20/15 1644 12/16/15 1650     6 Minute Walk   Phase Initial Discharge    Distance 1360 feet 1510 feet    Distance % Change  11 %    Walk Time 6 minutes     # of Rest Breaks 0     RPE 12 14    Symptoms Yes (comment)     Comments SOB, tingling in arms and legs, mild jaw pain. Was taken to the ER due to these symptoms     Resting HR 89 bpm 88 bpm    Resting BP 146/80 mmHg 144/78 mmHg    Max Ex. HR 116 bpm 134 bpm     Max Ex. BP 158/82 mmHg 160/72 mmHg       Initial Exercise Prescription:     Initial Exercise Prescription - 10/22/15 0900    Date of Initial Exercise RX and Referring Provider   Date --  Irven Coe called- said the MD told him that he can start       Perform Capillary Blood Glucose checks as needed.  Exercise Prescription Changes:     Exercise Prescription Changes      11/10/15 1600 11/18/15 1600 12/07/15 1400 12/15/15 1600 12/16/15 1700   Exercise Review   Progression Yes Yes Yes Yes    Response to Exercise   Blood Pressure (Admit) 128/70 mmHg  114/74 mmHg     Blood Pressure (Exercise) 160/80 mmHg  148/84 mmHg     Blood Pressure (Exit) 124/70 mmHg  128/70 mmHg     Heart Rate (Admit) 81 bpm  95 bpm     Heart Rate (Exercise) 98 bpm  113 bpm     Heart Rate (Exit) 87 bpm  94 bpm     Rating of Perceived Exertion (Exercise) 12  13     Symptoms No  No     Duration Progress to 45 minutes of aerobic exercise without signs/symptoms of physical distress Progress to 45 minutes of aerobic exercise without signs/symptoms of physical distress Progress to 45 minutes of aerobic exercise without signs/symptoms of physical distress Progress to 45 minutes of aerobic exercise without signs/symptoms of physical distress    Intensity Rest + 30 Rest + 30 Rest + 30 THRR New  THRR 40 - 80% 64 - 128    Progression   Progression Continue to progress workloads to maintain intensity without signs/symptoms of physical distress. Continue to progress workloads to maintain intensity without signs/symptoms of physical distress. Continue to progress workloads to maintain intensity without signs/symptoms of physical distress. Continue to progress workloads to maintain intensity without signs/symptoms of physical distress.    Resistance Training   Training Prescription Yes Yes Yes Yes    Weight _0 Reps 10-15 10-15 10-15 10-15    Interval Training   Interval Training No No No No    Treadmill   MPH  1.5 1.5 1.5 1.5    Grade _1 Minutes _2 Recumbant Bike   Level _3 RPM 40 40 40 40    Watts _4 Minutes _5 15  NuStep   Level _0 Watts _1 Minutes _2 Arm Ergometer   Level _3 Watts _4 Minutes _5 Arm/Foot Ergometer   Level _6 Watts _7 Minutes _8 Recumbant Elliptical   Level _9 RPM 40 40 40 40    Watts _10 Minutes _11 REL-XR   Level _12 Watts 35 35 35 35    Minutes _13 T5 Nustep   Level _14 Watts _15 Minutes _16 Biostep-RELP   Level _17 Watts _18 Minutes _19 Home Exercise Plan   Plans to continue exercise at     [employer] (comment)  [employer] or [employer], pt investigating options      Exercise Comments:     Exercise Comments      11/10/15 1752 11/12/15 1336 12/07/15 1438       Exercise Comments Anthon reports that he is for a repeat heart cath in the am after he had an abnormal stress test. No chest pain during his stress test he said and none in class/exercise time today.  Hairo has attended Heart Track on a regular basis.  He has experienced no difficulty during exercise sessions.  Goal will be to progress with program, taking into consideration outcome of cardiac cath.  Trentan continues to attend program regularly and is progressing in tolerance to exercise without difficulty.  Goal for next month will be to transition to exercise program upon completion of Heart Track.        Discharge Exercise Prescription (Final Exercise Prescription Changes):     Exercise Prescription Changes - 12/16/15 1700    Home Exercise Plan   Plans to continue exercise at [facility] (comment)  [employer] or [employer], pt investigating options      Nutrition:  Target Goals: Understanding of nutrition guidelines,  daily intake of sodium <159m, cholesterol <2059m calories 30% from fat and 7% or less from saturated fats, daily to have 5 or more servings of fruits and vegetables.  Biometrics:     Pre Biometrics - 10/20/15 1643    Pre Biometrics   Height _20  (1.778 m)   Weight (!) 302 lb 11.2 oz (137.304 kg)   Waist Circumference 52.5 inches   Hip Circumference 56.5 inches   Waist to Hip Ratio 0.93 %   BMI (Calculated) 43.5         Post Biometrics - 12/16/15 1649     Post  Biometrics   Height _21  (1.778 m)   Weight 294 lb (133.358 kg)   Waist Circumference 52.5 inches   Hip Circumference 55 inches   Waist to Hip Ratio 0.95 %   BMI (Calculated) 42.3      Nutrition Therapy Plan and Nutrition Goals:     Nutrition Therapy & Goals - 12/02/15 1825    Nutrition Therapy   Diet  1800kcal DASH   Protein (specify units) 8oz   Fiber 30 grams   Whole Grain Foods 3 servings   Saturated Fats 14 max. grams   Fruits and Vegetables 8 servings/day   Sodium 1500 grams   Personal Nutrition Goals   Personal Goal #1 continue with current eating pattern   Comments Patient is avoiding many starches to improve BG control. Advised high fiber, whole grain starches in controlled portions. Encouraged plant-based eating with small portions of lean meats and limited dairy fat.    Intervention Plan   Intervention Prescribe, educate and counsel regarding individualized specific dietary modifications aiming towards targeted core components such as weight, hypertension, lipid management, diabetes, heart failure and other comorbidities.;Nutrition handout(s) given to patient.   Expected Outcomes Short Term Goal: Understand basic principles of dietary content, such as calories, fat, sodium, cholesterol and nutrients.;Short Term Goal: A plan has been developed with personal nutrition goals set during dietitian appointment.;Long Term Goal: Adherence to prescribed nutrition plan.      Nutrition Discharge: Rate Your  Plate Scores:     Nutrition Assessments - 12/16/15 1805    Rate Your Plate Scores   Pre Score 78   Pre Score % 87 %   Post Score 78   Post Score % 87 %   % Change 0 %      Nutrition Goals Re-Evaluation:   Psychosocial: Target Goals: Acknowledge presence or absence of depression, maximize coping skills, provide positive support system. Participant is able to verbalize types and ability to use techniques and skills needed for reducing stress and depression.  Initial Review & Psychosocial Screening:     Initial Psych Review & Screening - 10/20/15 1646    Initial Review   Current issues with --  Patient denies being depressed.  Laughing, talkative, outgoing.  Speaking to others in the hallway when we walked through hallway.     Family Dynamics   Good Support System? Yes  Married x 25 years.  Has 2 children and 2 grand children.  Has church affiliation with [employer] [facility] [employer] [facility].     Barriers   Psychosocial barriers to participate in program There are no identifiable barriers or psychosocial needs.   Screening Interventions   Interventions Encouraged to exercise      Quality of Life Scores:     Quality of Life - 12/16/15 1806    Quality of Life Scores   Health/Function Pre 21.97 %   Health/Function Post 23.5 %   Health/Function % Change 6.96 %   Socioeconomic Pre 27.43 %   Socioeconomic Post 26.57 %   Socioeconomic % Change  -3.14 %   Psych/Spiritual Pre 27.43 %   Psych/Spiritual Post 28.8 %   Psych/Spiritual % Change 4.99 %   Family Pre 30 %   Family Post 28.8 %   Family % Change -4 %   GLOBAL Pre 25.4 %   GLOBAL Post 25.37 %   GLOBAL % Change -0.12 %      PHQ-9:     Recent Review Flowsheet Data    Depression screen [facility] 2/9 12/16/2015 10/20/2015   Decreased Interest 0 0   Down, Depressed, Hopeless 0 0   PHQ - 2 Score 0 0   Altered sleeping 0 1   Tired, decreased energy 1 0   Change in appetite 1 0   Feeling bad or failure about yourself  0 0    Trouble concentrating 0 0   Moving slowly or fidgety/restless 0 0  Suicidal thoughts 0 0   PHQ-9 Score 2 1   Difficult doing work/chores Not difficult at all Somewhat difficult      Psychosocial Evaluation and Intervention:     Psychosocial Evaluation - 11/24/15 1702    Psychosocial Evaluation & Interventions   Interventions Stress management education;Encouraged to exercise with the program and follow exercise prescription   Comments Counselor met with Mr. Brining for initial psychosocial evaluation today.  He is a 60 year old who had a heart attack several months ago  He has a strong support system with  spouse of 26 years; (2) adult children who live close by as well as a mother-in-law.  Mr. Heideman is also actively involved in his local church.  He reports sleeping well and having a diminished appetite since the cardiac procedure.  He denies a history of depression or anxiety or any current symptoms.  Mr. Stgermain states he is typically a positive person and has minimal stress in his life.  His goal for this program is to lose more weight - since he has lost 37 lbs so far, and begin a consistent exercise regimen to a healthier lifestyle.  Mr. Gittleman plans to walk outside and swim more in the future to maintain consistency.  He is encouraged to meet with the dietician to address his weight loss goals.        Psychosocial Re-Evaluation:     Psychosocial Re-Evaluation      10/22/15 0956 12/13/15 1722         Psychosocial Re-Evaluation   Interventions Encouraged to attend Cardiac Rehabilitation for the exercise       Comments Irven Coe called and said the MD told him that he can start Cardiac Rehab. (It is typed in the recent hospitalization discharge summary. Cardiac Enzymes were negative It is documented "Dr. Rockey Situ did not feel like this was angina" in Epic charting note. Gentry Pilson said he really wants to start Cardiac Rehab.  Counselor follow up today with Mr. Stepanek reporting doing  well and loving this program so far, especially the socialization.  He states he continues to sleep well and his mood is stable.  Mr. Beber states he is coping well after the loss of his beloved dog recently and he is actively looking for another pet to enjoy as well.  He plans to join a [employer] program upon completion here at [place].           Vocational Rehabilitation: Provide vocational rehab assistance to qualifying candidates.   Vocational Rehab Evaluation & Intervention:     Vocational Rehab - 10/20/15 1639    Initial Vocational Rehab Evaluation & Intervention   Assessment shows need for Vocational Rehabilitation No      Education: Education Goals: Education classes will be provided on a weekly basis, covering required topics. Participant will state understanding/return demonstration of topics presented.  Learning Barriers/Preferences:     Learning Barriers/Preferences - 10/20/15 1637    Learning Barriers/Preferences   Learning Barriers None   Learning Preferences None      Education Topics: General Nutrition Guidelines/Fats and Fiber: -Group instruction provided by verbal, written material, models and posters to present the general guidelines for heart healthy nutrition. Gives an explanation and review of dietary fats and fiber.          Cardiac Rehab from 12/13/2015 in [facility] Cardiac and Pulmonary Rehab   Date  10/25/15   Educator  PI   Instruction Review Code  2- meets goals/outcomes  Controlling Sodium/Reading Food Labels: -Group verbal and written material supporting the discussion of sodium use in heart healthy nutrition. Review and explanation with models, verbal and written materials for utilization of the food label.      Cardiac Rehab from 12/13/2015 in [facility] Cardiac and Pulmonary Rehab   Date  11/01/15   Educator  PI   Instruction Review Code  2- meets goals/outcomes      Exercise Physiology & Risk Factors: - Group verbal and written  instruction with models to review the exercise physiology of the cardiovascular system and associated critical values. Details cardiovascular disease risk factors and the goals associated with each risk factor.      Cardiac Rehab from 12/13/2015 in [facility] Cardiac and Pulmonary Rehab   Date  11/10/15   Educator  Salley Hews, PT   Instruction Review Code  2- meets goals/outcomes      Aerobic Exercise & Resistance Training: - Gives group verbal and written discussion on the health impact of inactivity. On the components of aerobic and resistive training programs and the benefits of this training and how to safely progress through these programs.      Cardiac Rehab from 12/13/2015 in [facility] Cardiac and Pulmonary Rehab   Date  11/15/15   Educator  BS   Instruction Review Code  2- meets goals/outcomes      Flexibility, Balance, General Exercise Guidelines: - Provides group verbal and written instruction on the benefits of flexibility and balance training programs. Provides general exercise guidelines with specific guidelines to those with heart or lung disease. Demonstration and skill practice provided.      Cardiac Rehab from 12/13/2015 in [facility] Cardiac and Pulmonary Rehab   Date  11/17/15   Educator  S. Bice, RN   Instruction Review Code  2- meets goals/outcomes      Stress Management: - Provides group verbal and written instruction about the health risks of elevated stress, cause of high stress, and healthy ways to reduce stress.      Cardiac Rehab from 12/13/2015 in [facility] Cardiac and Pulmonary Rehab   Date  11/24/15   Educator  Berle Mull, MSW   Instruction Review Code  2- meets goals/outcomes      Depression: - Provides group verbal and written instruction on the correlation between heart/lung disease and depressed mood, treatment options, and the stigmas associated with seeking treatment.      Cardiac Rehab from 12/13/2015 in [facility] Cardiac and Pulmonary Rehab   Date  10/27/15    Educator  Kathreen Cornfield, [facility]   Instruction Review Code  2- meets goals/outcomes      Anatomy & Physiology of the Heart: - Group verbal and written instruction and models provide basic cardiac anatomy and physiology, with the coronary electrical and arterial systems. Review of: AMI, Angina, Valve disease, Heart Failure, Cardiac Arrhythmia, Pacemakers, and the ICD.   Cardiac Procedures: - Group verbal and written instruction and models to describe the testing methods done to diagnose heart disease. Reviews the outcomes of the test results. Describes the treatment choices: Medical Management, Angioplasty, or Coronary Bypass Surgery.      Cardiac Rehab from 12/13/2015 in [facility] Cardiac and Pulmonary Rehab   Date  11/29/15   Educator  SB   Instruction Review Code  2- meets goals/outcomes      Cardiac Medications: - Group verbal and written instruction to review commonly prescribed medications for heart disease. Reviews the medication, class of the drug, and side effects. Includes the steps to  properly store meds and maintain the prescription regimen.      Cardiac Rehab from 12/13/2015 in [facility] Cardiac and Pulmonary Rehab   Date  12/08/15   Educator  DW   Instruction Review Code  2- meets goals/outcomes      Go Sex-Intimacy & Heart Disease, Get SMART - Goal Setting: - Group verbal and written instruction through game format to discuss heart disease and the return to sexual intimacy. Provides group verbal and written material to discuss and apply goal setting through the application of the S.M.A.R.T. Method.      Cardiac Rehab from 12/13/2015 in [facility] Cardiac and Pulmonary Rehab   Date  11/29/15   Educator  SB   Instruction Review Code  2- meets goals/outcomes      Other Matters of the Heart: - Provides group verbal, written materials and models to describe Heart Failure, Angina, Valve Disease, and Diabetes in the realm of heart disease. Includes description of the disease process and treatment  options available to the cardiac patient.      Cardiac Rehab from 12/13/2015 in [facility] Cardiac and Pulmonary Rehab   Date  11/08/15   Educator  SB   Instruction Review Code  2- meets goals/outcomes      Exercise & Equipment Safety: - Individual verbal instruction and demonstration of equipment use and safety with use of the equipment.      Cardiac Rehab from 12/13/2015 in [facility] Cardiac and Pulmonary Rehab   Date  10/20/15   Educator  D. Joya Gaskins, RN   Instruction Review Code  1- partially meets, needs review/practice      Infection Prevention: - Provides verbal and written material to individual with discussion of infection control including proper hand washing and proper equipment cleaning during exercise session.      Cardiac Rehab from 12/13/2015 in [facility] Cardiac and Pulmonary Rehab   Date  10/20/15   Educator  D. Joya Gaskins, RN      Falls Prevention: - Provides verbal and written material to individual with discussion of falls prevention and safety.      Cardiac Rehab from 12/13/2015 in [facility] Cardiac and Pulmonary Rehab   Date  10/20/15   Educator  D. Joya Gaskins, RN   Instruction Review Code  2- meets goals/outcomes      Diabetes: - Individual verbal and written instruction to review signs/symptoms of diabetes, desired ranges of glucose level fasting, after meals and with exercise. Advice that pre and post exercise glucose checks will be done for 3 sessions at entry of program.      Cardiac Rehab from 12/13/2015 in [facility] Cardiac and Pulmonary Rehab   Date  10/20/15   Educator  D. Joya Gaskins, RN   Instruction Review Code  1- partially meets, needs review/practice       Knowledge Questionnaire Score:     Knowledge Questionnaire Score - 12/16/15 1806    Knowledge Questionnaire Score   Pre Score 22/28   Post Score 25/28      Core Components/Risk Factors/Patient Goals at Admission:     Personal Goals and Risk Factors at Admission - 10/20/15 1641    Core Components/Risk Factors/Patient  Goals on Admission    Weight Management Yes;Obesity   Intervention Weight Management/Obesity: Establish reasonable short term and long term weight goals.;Obesity: Provide education and appropriate resources to help participant work on and attain dietary goals.   Admit Weight 302 lb 11.2 oz (137.304 kg)   Goal Weight: Short Term 290 lb (131.543 kg)  Lose  1 - 2 pounds per week.     Goal Weight: Long Term 200 lb (90.719 kg)   Expected Outcomes Short Term: Continue to assess and modify interventions until short term weight is achieved;Long Term: Adherence to nutrition and physical activity/exercise program aimed toward attainment of established weight goal;Weight Loss: Understanding of general recommendations for a balanced deficit meal plan, which promotes 1-2 lb weight loss per week and includes a negative energy balance of [phone] kcal/d;Understanding recommendations for meals to include 15-35% energy as protein, 25-35% energy from fat, 35-60% energy from carbohydrates, less than 254m of dietary cholesterol, 20-35 gm of total fiber daily;Understanding of distribution of calorie intake throughout the day with the consumption of 4-5 meals/snacks   Sedentary Yes   Intervention Provide advice, education, support and counseling about physical activity/exercise needs.;Develop an individualized exercise prescription for aerobic and resistive training based on initial evaluation findings, risk stratification, comorbidities and participant's personal goals.   Expected Outcomes Achievement of increased cardiorespiratory fitness and enhanced flexibility, muscular endurance and strength shown through measurements of functional capacity and personal statement of participant.   Increase Strength and Stamina Yes   Intervention Provide advice, education, support and counseling about physical activity/exercise needs.;Develop an individualized exercise prescription for aerobic and resistive training based on initial  evaluation findings, risk stratification, comorbidities and participant's personal goals.   Expected Outcomes Achievement of increased cardiorespiratory fitness and enhanced flexibility, muscular endurance and strength shown through measurements of functional capacity and personal statement of participant.   Diabetes Yes   Intervention Provide education about signs/symptoms and action to take for hypo/hyperglycemia.;Provide education about proper nutrition, including hydration, and aerobic/resistive exercise prescription along with prescribed medications to achieve blood glucose in normal ranges: Fasting glucose 65-99 mg/dL   Expected Outcomes Short Term: Participant verbalizes understanding of the signs/symptoms and immediate care of hyper/hypoglycemia, proper foot care and importance of medication, aerobic/resistive exercise and nutrition plan for blood glucose control.;Long Term: Attainment of HbA1C < 7%.   Hypertension Yes   Intervention Provide education on lifestyle modifcations including regular physical activity/exercise, weight management, moderate sodium restriction and increased consumption of fresh fruit, vegetables, and low fat dairy, alcohol moderation, and smoking cessation.;Monitor prescription use compliance.   Expected Outcomes Short Term: Continued assessment and intervention until BP is < 140/921mHG in hypertensive participants. < 130/8028mG in hypertensive participants with diabetes, heart failure or chronic kidney disease.;Long Term: Maintenance of blood pressure at goal levels.   Lipids Yes   Intervention Provide education and support for participant on nutrition & aerobic/resistive exercise along with prescribed medications to achieve LDL <38m26mDL >40mg64mExpected Outcomes Short Term: Participant states understanding of desired cholesterol values and is compliant with medications prescribed. Participant is following exercise prescription and nutrition guidelines.;Long Term:  Cholesterol controlled with medications as prescribed, with individualized exercise RX and with personalized nutrition plan. Value goals: LDL < 38mg,80m > 40 mg.      Core Components/Risk Factors/Patient Goals Review:      Goals and Risk Factor Review      11/10/15 1752           Core Components/Risk Factors/Patient Goals Review   Personal Goals Review Sedentary       Review Leiby rLenfordts that he is for a repeat heart cath in the am after he had an abnormal stress test. No chest pain during his stress test he said and none in class/exercise time today.        Expected Outcomes To be  able to return to Cardiac Rehab to exercise.           Core Components/Risk Factors/Patient Goals at Discharge (Final Review):      Goals and Risk Factor Review - 11/10/15 1752    Core Components/Risk Factors/Patient Goals Review   Personal Goals Review Sedentary   Review Armonie reports that he is for a repeat heart cath in the am after he had an abnormal stress test. No chest pain during his stress test he said and none in class/exercise time today.    Expected Outcomes To be able to return to Cardiac Rehab to exercise.       ITP Comments:     ITP Comments      10/22/15 0955 11/10/15 1751 11/14/15 1038 11/15/15 1607 12/12/15 1119   ITP Comments Irven Coe called and said the MD told him that he can start Cardiac Rehab. (It is typed in the recent hospitalization discharge summary. Cardiac Enzymes were negative It is documented "Dr. Rockey Situ did not feel like this was angina" in Epic charting note.  Raydon reports that he is for a repeat heart cath in the am after he had an abnormal stress test. No chest pain during his stress test he said and none in class/exercise time today.  30 Day review. Continue with ITP  New to program [facility] reports he had another cardiac cath done last week.  Reoprted to him, all was fine.  He will be decreasing his hours at work per MD prescription. 30 day review. Continue with ITP       Comments:

## 2015-12-23 NOTE — Progress Notes (Signed)
Discharge Summary  Patient Details  Name: Michael Jordan MRN: [id number] Date of Birth: 07-Feb-1956 Referring Provider:     Number of Visits: 36  Reason for Discharge:  Patient reached a stable level of exercise. Patient independent in their exercise.  Smoking History:  History  Smoking status  . Former Smoker -- 0.50 packs/day for 44 years  . Types: Cigarettes  . Quit date: 06/02/2015  Smokeless tobacco  . Not on file    Diagnosis:  ST elevation myocardial infarction (STEMI), unspecified artery (HCC)  Status post coronary artery stent placement  ADL [facility]:   Initial Exercise Prescription:     Initial Exercise Prescription - 10/22/15 0900    Date of Initial Exercise RX and Referring Provider   Date --  Carl Best called- said the MD told him that he can start       Discharge Exercise Prescription (Final Exercise Prescription Changes):     Exercise Prescription Changes - 12/16/15 1700    Home Exercise Plan   Plans to continue exercise at [facility] (comment)  [employer] or [employer], pt investigating options      Functional Capacity:     6 Minute Walk      10/20/15 1644 12/16/15 1650     6 Minute Walk   Phase Initial Discharge    Distance 1360 feet 1510 feet    Distance % Change  11 %    Walk Time 6 minutes     # of Rest Breaks 0     RPE 12 14    Symptoms Yes (comment)     Comments SOB, tingling in arms and legs, mild jaw pain. Was taken to the ER due to these symptoms     Resting HR 89 bpm 88 bpm    Resting BP 146/80 mmHg 144/78 mmHg    Max Ex. HR 116 bpm 134 bpm    Max Ex. BP 158/82 mmHg 160/72 mmHg       Psychological, QOL, Others - Outcomes: PHQ 2/9: Depression screen [facility] 2/9 12/16/2015 10/20/2015  Decreased Interest 0 0  Down, Depressed, Hopeless 0 0  PHQ - 2 Score 0 0  Altered sleeping 0 1  Tired, decreased energy 1 0  Change in appetite 1 0  Feeling bad or failure about yourself  0 0  Trouble concentrating 0 0  Moving  slowly or fidgety/restless 0 0  Suicidal thoughts 0 0  PHQ-9 Score 2 1  Difficult doing work/chores Not difficult at all Somewhat difficult    Quality of Life:     Quality of Life - 12/16/15 1806    Quality of Life Scores   Health/Function Pre 21.97 %   Health/Function Post 23.5 %   Health/Function % Change 6.96 %   Socioeconomic Pre 27.43 %   Socioeconomic Post 26.57 %   Socioeconomic % Change  -3.14 %   Psych/Spiritual Pre 27.43 %   Psych/Spiritual Post 28.8 %   Psych/Spiritual % Change 4.99 %   Family Pre 30 %   Family Post 28.8 %   Family % Change -4 %   GLOBAL Pre 25.4 %   GLOBAL Post 25.37 %   GLOBAL % Change -0.12 %      Personal Goals: Goals established at orientation with interventions provided to work toward goal.     Personal Goals and Risk Factors at Admission - 10/20/15 1641    Core Components/Risk Factors/Patient Goals on Admission    Weight Management Yes;Obesity  Intervention Weight Management/Obesity: Establish reasonable short term and long term weight goals.;Obesity: Provide education and appropriate resources to help participant work on and attain dietary goals.   Admit Weight 302 lb 11.2 oz (137.304 kg)   Goal Weight: Short Term 290 lb (131.543 kg)  Lose 1 - 2 pounds per week.     Goal Weight: Long Term 200 lb (90.719 kg)   Expected Outcomes Short Term: Continue to assess and modify interventions until short term weight is achieved;Long Term: Adherence to nutrition and physical activity/exercise program aimed toward attainment of established weight goal;Weight Loss: Understanding of general recommendations for a balanced deficit meal plan, which promotes 1-2 lb weight loss per week and includes a negative energy balance of [phone] kcal/d;Understanding recommendations for meals to include 15-35% energy as protein, 25-35% energy from fat, 35-60% energy from carbohydrates, less than 200mg  of dietary cholesterol, 20-35 gm of total fiber daily;Understanding  of distribution of calorie intake throughout the day with the consumption of 4-5 meals/snacks   Sedentary Yes   Intervention Provide advice, education, support and counseling about physical activity/exercise needs.;Develop an individualized exercise prescription for aerobic and resistive training based on initial evaluation findings, risk stratification, comorbidities and participant's personal goals.   Expected Outcomes Achievement of increased cardiorespiratory fitness and enhanced flexibility, muscular endurance and strength shown through measurements of functional capacity and personal statement of participant.   Increase Strength and Stamina Yes   Intervention Provide advice, education, support and counseling about physical activity/exercise needs.;Develop an individualized exercise prescription for aerobic and resistive training based on initial evaluation findings, risk stratification, comorbidities and participant's personal goals.   Expected Outcomes Achievement of increased cardiorespiratory fitness and enhanced flexibility, muscular endurance and strength shown through measurements of functional capacity and personal statement of participant.   Diabetes Yes   Intervention Provide education about signs/symptoms and action to take for hypo/hyperglycemia.;Provide education about proper nutrition, including hydration, and aerobic/resistive exercise prescription along with prescribed medications to achieve blood glucose in normal ranges: Fasting glucose 65-99 mg/dL   Expected Outcomes Short Term: Participant verbalizes understanding of the signs/symptoms and immediate care of hyper/hypoglycemia, proper foot care and importance of medication, aerobic/resistive exercise and nutrition plan for blood glucose control.;Long Term: Attainment of HbA1C < 7%.   Hypertension Yes   Intervention Provide education on lifestyle modifcations including regular physical activity/exercise, weight management, moderate  sodium restriction and increased consumption of fresh fruit, vegetables, and low fat dairy, alcohol moderation, and smoking cessation.;Monitor prescription use compliance.   Expected Outcomes Short Term: Continued assessment and intervention until BP is < 140/890mm HG in hypertensive participants. < 130/5780mm HG in hypertensive participants with diabetes, heart failure or chronic kidney disease.;Long Term: Maintenance of blood pressure at goal levels.   Lipids Yes   Intervention Provide education and support for participant on nutrition & aerobic/resistive exercise along with prescribed medications to achieve LDL 70mg , HDL >40mg .   Expected Outcomes Short Term: Participant states understanding of desired cholesterol values and is compliant with medications prescribed. Participant is following exercise prescription and nutrition guidelines.;Long Term: Cholesterol controlled with medications as prescribed, with individualized exercise RX and with personalized nutrition plan. Value goals: LDL < 70mg , HDL > 40 mg.       Personal Goals Discharge:     Goals and Risk Factor Review      11/10/15 1752           Core Components/Risk Factors/Patient Goals Review   Personal Goals Review Sedentary       Review  Redford reports that he is for a repeat heart cath in the am after he had an abnormal stress test. No chest pain during his stress test he said and none in class/exercise time today.        Expected Outcomes To be able to return to Cardiac Rehab to exercise.           Nutrition & Weight - Outcomes:     Pre Biometrics - 10/20/15 1643    Pre Biometrics   Height  (1.778 m)   Weight (!) 302 lb 11.2 oz (137.304 kg)   Waist Circumference 52.5 inches   Hip Circumference 56.5 inches   Waist to Hip Ratio 0.93 %   BMI (Calculated) 43.5         Post Biometrics - 12/16/15 1649     Post  Biometrics   Height  (1.778 m)   Weight 294 lb (133.358 kg)   Waist Circumference 52.5 inches    Hip Circumference 55 inches   Waist to Hip Ratio 0.95 %   BMI (Calculated) 42.3      Nutrition:     Nutrition Therapy & Goals - 12/02/15 1825    Nutrition Therapy   Diet 1800kcal DASH   Protein (specify units) 8oz   Fiber 30 grams   Whole Grain Foods 3 servings   Saturated Fats 14 max. grams   Fruits and Vegetables 8 servings/day   Sodium 1500 grams   Personal Nutrition Goals   Personal Goal #1 continue with current eating pattern   Comments Patient is avoiding many starches to improve BG control. Advised high fiber, whole grain starches in controlled portions. Encouraged plant-based eating with small portions of lean meats and limited dairy fat.    Intervention Plan   Intervention Prescribe, educate and counsel regarding individualized specific dietary modifications aiming towards targeted core components such as weight, hypertension, lipid management, diabetes, heart failure and other comorbidities.;Nutrition handout(s) given to patient.   Expected Outcomes Short Term Goal: Understand basic principles of dietary content, such as calories, fat, sodium, cholesterol and nutrients.;Short Term Goal: A plan has been developed with personal nutrition goals set during dietitian appointment.;Long Term Goal: Adherence to prescribed nutrition plan.      Nutrition Discharge:     Nutrition Assessments - 12/16/15 1805    Rate Your Plate Scores   Pre Score 78   Pre Score % 87 %   Post Score 78   Post Score % 87 %   % Change 0 %      Education Questionnaire Score:     Knowledge Questionnaire Score - 12/16/15 1806    Knowledge Questionnaire Score   Pre Score 22/28   Post Score 25/28      Goals reviewed with patient; copy given to patient.

## 2015-12-23 NOTE — Patient Instructions (Signed)
Discharge Instructions  Patient Details  Name: Michael Jordan MRN: [id number] Date of Birth: 04/11/1956 Referring Provider:  Sonnenberg, Eric G, MD   Number of Visits: __  Reason for Discharge:  Patient reached a stable level of exercise. Patient independent in their exercise.  Smoking History:  History  Smoking status  . Former Smoker -- 0.50 packs/day for 44 years  . Types: Cigarettes  . Quit date: 06/02/2015  Smokeless tobacco  . Not on file    Diagnosis:  ST elevation myocardial infarction (STEMI), unspecified artery (HCC)  Status post coronary artery stent placement  Initial Exercise Prescription:     Initial Exercise Prescription - 10/22/15 0900    Date of Initial Exercise RX and Referring Provider   Date --  Ciro Galyon called- said the MD told him that he can start       Discharge Exercise Prescription (Final Exercise Prescription Changes):     Exercise Prescription Changes - 12/16/15 1700    Home Exercise Plan   [facility]  [employer] or [employer], pt investigating options      Functional Capacity:     6 Minute Walk      10/20/15 1644 12/16/15 1650     6 Minute Walk   Phase Initial Discharge    Distance 1360 feet 1510 feet    Distance % Change  11 %    Walk Time 6 minutes     # of Rest Breaks 0     RPE 12 14    Symptoms Yes (comment)     Comments SOB, tingling in arms and legs, mild jaw pain. Was taken to the ER due to these symptoms     Resting HR 89 bpm 88 bpm    Resting BP 146/80 mmHg 144/78 mmHg    Max Ex. HR 116 bpm 134 bpm    Max Ex. BP 158/82 mmHg 160/72 mmHg       Quality of Life:     Quality of Life - 12/16/15 1806    Quality of Life Scores   Health/Function Pre 21.97 %   Health/Function Post 23.5 %   Health/Function % Change 6.96 %   Socioeconomic Pre 27.43 %   Socioeconomic Post 26.57 %   Socioeconomic % Change  -3.14 %   Psych/Spiritual Pre 27.43 %   Psych/Spiritual Post 28.8 %   Psych/Spiritual % Change 4.99 %   Family Pre 30 %   Family Post 28.8 %   Family % Change -4 %   GLOBAL Pre 25.4 %   GLOBAL Post 25.37 %   GLOBAL % Change -0.12 %      Personal Goals: Goals established at orientation with interventions provided to work toward goal.     Personal Goals and Risk Factors at Admission - 10/20/15 1641    Core Components/Risk Factors/Patient Goals on Admission    Weight Management Yes;Obesity   Intervention Weight Management/Obesity: Establish reasonable short term and long term weight goals.;Obesity: Provide education and appropriate resources to help participant work on and attain dietary goals.   Admit Weight 302 lb 11.2 oz (137.304 kg)   Goal Weight: Short Term 290 lb (131.543 kg)  Lose 1 - 2 pounds per week.     Goal Weight: Long Term 200 lb (90.719 kg)   Expected Outcomes Short Term: Continue to assess and modify interventions until short term weight is achieved;Long Term: Adherence to nutrition and physical activity/exercise program aimed toward attainment   of established weight goal;Weight Loss: Understanding of general recommendations for a balanced deficit meal plan, which promotes 1-2 lb weight loss per week and includes a negative energy balance of 500-1000 kcal/d;Understanding recommendations for meals to include 15-35% energy as protein, 25-35% energy from fat, 35-60% energy from carbohydrates, less than 200mg of dietary cholesterol, 20-35 gm of total fiber daily;Understanding of distribution of calorie intake throughout the day with the consumption of 4-5 meals/snacks   Sedentary Yes   Intervention Provide advice, education, support and counseling about physical activity/exercise needs.;Develop an individualized exercise prescription for aerobic and resistive training based on initial evaluation findings, risk stratification, comorbidities and participant's personal goals.   Expected Outcomes Achievement of increased  cardiorespiratory fitness and enhanced flexibility, muscular endurance and strength shown through measurements of functional capacity and personal statement of participant.   Increase Strength and Stamina Yes   Intervention Provide advice, education, support and counseling about physical activity/exercise needs.;Develop an individualized exercise prescription for aerobic and resistive training based on initial evaluation findings, risk stratification, comorbidities and participant's personal goals.   Expected Outcomes Achievement of increased cardiorespiratory fitness and enhanced flexibility, muscular endurance and strength shown through measurements of functional capacity and personal statement of participant.   Diabetes Yes   Intervention Provide education about signs/symptoms and action to take for hypo/hyperglycemia.;Provide education about proper nutrition, including hydration, and aerobic/resistive exercise prescription along with prescribed medications to achieve blood glucose in normal ranges: Fasting glucose 65-99 mg/dL   Expected Outcomes Short Term: Participant verbalizes understanding of the signs/symptoms and immediate care of hyper/hypoglycemia, proper foot care and importance of medication, aerobic/resistive exercise and nutrition plan for blood glucose control.;Long Term: Attainment of HbA1C < 7%.   Hypertension Yes   Intervention Provide education on lifestyle modifcations including regular physical activity/exercise, weight management, moderate sodium restriction and increased consumption of fresh fruit, vegetables, and low fat dairy, alcohol moderation, and smoking cessation.;Monitor prescription use compliance.   Expected Outcomes Short Term: Continued assessment and intervention until BP is < 140/90mm HG in hypertensive participants. < 130/80mm HG in hypertensive participants with diabetes, heart failure or chronic kidney disease.;Long Term: Maintenance of blood pressure at goal  levels.   Lipids Yes   Intervention Provide education and support for participant on nutrition & aerobic/resistive exercise along with prescribed medications to achieve LDL <70mg, HDL >40mg.   Expected Outcomes Short Term: Participant states understanding of desired cholesterol values and is compliant with medications prescribed. Participant is following exercise prescription and nutrition guidelines.;Long Term: Cholesterol controlled with medications as prescribed, with individualized exercise RX and with personalized nutrition plan. Value goals: LDL < 70mg, HDL > 40 mg.       Personal Goals Discharge:     Goals and Risk Factor Review - 11/10/15 1752    Core Components/Risk Factors/Patient Goals Review   Personal Goals Review Sedentary   Review Tripp reports that he is for a repeat heart cath in the am after he had an abnormal stress test. No chest pain during his stress test he said and none in class/exercise time today.    Expected Outcomes To be able to return to Cardiac Rehab to exercise.       Nutrition & Weight - Outcomes:     Pre Biometrics - 10/20/15 1643    Pre Biometrics   Height 5' 10" (1.778 m)   Weight (!) 302 lb 11.2 oz (137.304 kg)   Waist Circumference 52.5 inches   Hip Circumference 56.5 inches   Waist to Hip Ratio   0.93 %   BMI (Calculated) 43.5         Post Biometrics - 12/16/15 1649     Post  Biometrics   Height 5' 10" (1.778 m)   Weight 294 lb (133.358 kg)   Waist Circumference 52.5 inches   Hip Circumference 55 inches   Waist to Hip Ratio 0.95 %   BMI (Calculated) 42.3      Nutrition:     Nutrition Therapy & Goals - 12/02/15 1825    Nutrition Therapy   Diet 1800kcal DASH   Protein (specify units) 8oz   Fiber 30 grams   Whole Grain Foods 3 servings   Saturated Fats 14 max. grams   Fruits and Vegetables 8 servings/day   Sodium 1500 grams   Personal Nutrition Goals   Personal Goal #1 continue with current eating pattern   Comments Patient is  avoiding many starches to improve BG control. Advised high fiber, whole grain starches in controlled portions. Encouraged plant-based eating with small portions of lean meats and limited dairy fat.    Intervention Plan   Intervention Prescribe, educate and counsel regarding individualized specific dietary modifications aiming towards targeted core components such as weight, hypertension, lipid management, diabetes, heart failure and other comorbidities.;Nutrition handout(s) given to patient.   Expected Outcomes Short Term Goal: Understand basic principles of dietary content, such as calories, fat, sodium, cholesterol and nutrients.;Short Term Goal: A plan has been developed with personal nutrition goals set during dietitian appointment.;Long Term Goal: Adherence to prescribed nutrition plan.      Nutrition Discharge:     Nutrition Assessments - 12/16/15 1805    Rate Your Plate Scores   Pre Score 78   Pre Score % 87 %   Post Score 78   Post Score % 87 %   % Change 0 %      Education Questionnaire Score:     Knowledge Questionnaire Score - 12/16/15 1806    Knowledge Questionnaire Score   Pre Score 22/28   Post Score 25/28      Goals reviewed with patient; copy given to patient.  

## 2015-12-23 NOTE — Progress Notes (Signed)
Cardiac Individual Treatment Plan  Patient Details  Name: Michael Jordan MRN: [id number] Date of Birth: July 22, 1956 Referring Provider:    Initial Encounter Date:       Documentation from 10/22/2015 in [facility] Cardiac and Pulmonary Rehab   Date  -- Irven Coe called- said the MD told him that he can start ]      Visit Diagnosis: ST elevation myocardial infarction (STEMI), unspecified artery ([place])  Status post coronary artery stent placement  Patient's Home Medications on Admission:  Current outpatient prescriptions:  .  aspirin 81 MG chewable tablet, Chew 1 tablet (81 mg total) by mouth daily., Disp: , Rfl:  .  atorvastatin (LIPITOR) 80 MG tablet, Take 1 tablet (80 mg total) by mouth daily at 6 PM., Disp: 30 tablet, Rfl: 6 .  carvedilol (COREG) 3.125 MG tablet, Take 1 tablet (3.125 mg total) by mouth 2 (two) times daily with a meal., Disp: 60 tablet, Rfl: 6 .  lisinopril (PRINIVIL,ZESTRIL) 5 MG tablet, Take 1 tablet (5 mg total) by mouth daily., Disp: 30 tablet, Rfl: 6 .  metFORMIN (GLUCOPHAGE) 1000 MG tablet, Take 1 tablet (1,000 mg total) by mouth 2 (two) times daily with a meal., Disp: 180 tablet, Rfl: 3 .  nitroGLYCERIN (NITROSTAT) 0.4 MG SL tablet, Place 1 tablet (0.4 mg total) under the tongue every 5 (five) minutes as needed for chest pain., Disp: 25 tablet, Rfl: 3 .  spironolactone (ALDACTONE) 25 MG tablet, Take 0.5 tablets (12.5 mg total) by mouth daily., Disp: 30 tablet, Rfl: 6 .  ticagrelor (BRILINTA) 90 MG TABS tablet, Take 1 tablet (90 mg total) by mouth 2 (two) times daily., Disp: 60 tablet, Rfl: 6  Past Medical History: Past Medical History  Diagnosis Date  . CAD (coronary artery disease)     a. 09/2015 Ant STEMI/PCI: LM nl, LAD 100p (3.0x18 Xience Alpine DES), D1 40, SP1 40ost, D3 min irregs, LCX 34m OM1 nl, OM3 min irregs, RCA 80p/1073mCTO)->fills via collats, EF 45-50%.  . Cardiomyopathy, ischemic     a. 09/2015 Echo: EF 35-40%, sev apical, periapical, antapical HK,  mild to mod ant, inf HK, Gr1 DD.  . Morbid obesity ([facility]  . Tobacco abuse     a. Quit 05/2015.  . Marland Kitchenyperlipidemia   . Diabetes ([facility]    Tobacco Use: History  Smoking status  . Former Smoker -- 0.50 packs/day for 44 years  . Types: Cigarettes  . Quit date: 06/02/2015  Smokeless tobacco  . Not on file    Labs: Recent Review Flowsheet Data    Labs for ITP Cardiac and Pulmonary Rehab Latest Ref Rng 10/01/2015 11/01/2015   Cholestrol 0 - 200 mg/dL 214(H) 118   LDLCALC 0 - 99 mg/dL 126(H) 58   HDL >40 mg/dL 41 37(L)   Trlycerides <150 mg/dL 234(H) 115   Hemoglobin A1c 4.0 - 6.0 % 8.0(H) -       Exercise Target Goals:    Exercise Program Goal: Individual exercise prescription set with THRR, safety & activity barriers. Participant demonstrates ability to understand and report RPE using BORG scale, to self-measure pulse accurately, and to acknowledge the importance of the exercise prescription.  Exercise Prescription Goal: Starting with aerobic activity 30 plus minutes a day, 3 days per week for initial exercise prescription. Provide home exercise prescription and guidelines that participant acknowledges understanding prior to discharge.  Activity Barriers & Risk Stratification:     Activity Barriers & Cardiac Risk Stratification - 10/20/15 1635  Activity Barriers & Cardiac Risk Stratification   Activity Barriers Other (comment)   Comments Newly diagnosed Cardiac Patient   Cardiac Risk Stratification High      6 Minute Walk:     6 Minute Walk      10/20/15 1644 12/16/15 1650     6 Minute Walk   Phase Initial Discharge    Distance 1360 feet 1510 feet    Distance % Change  11 %    Walk Time 6 minutes     # of Rest Breaks 0     RPE 12 14    Symptoms Yes (comment)     Comments SOB, tingling in arms and legs, mild jaw pain. Was taken to the ER due to these symptoms     Resting HR 89 bpm 88 bpm    Resting BP 146/80 mmHg 144/78 mmHg    Max Ex. HR 116 bpm 134 bpm     Max Ex. BP 158/82 mmHg 160/72 mmHg       Initial Exercise Prescription:     Initial Exercise Prescription - 10/22/15 0900    Date of Initial Exercise RX and Referring Provider   Date --  Irven Coe called- said the MD told him that he can start       Perform Capillary Blood Glucose checks as needed.  Exercise Prescription Changes:     Exercise Prescription Changes      11/10/15 1600 11/18/15 1600 12/07/15 1400 12/15/15 1600 12/16/15 1700   Exercise Review   Progression Yes Yes Yes Yes    Response to Exercise   Blood Pressure (Admit) 128/70 mmHg  114/74 mmHg     Blood Pressure (Exercise) 160/80 mmHg  148/84 mmHg     Blood Pressure (Exit) 124/70 mmHg  128/70 mmHg     Heart Rate (Admit) 81 bpm  95 bpm     Heart Rate (Exercise) 98 bpm  113 bpm     Heart Rate (Exit) 87 bpm  94 bpm     Rating of Perceived Exertion (Exercise) 12  13     Symptoms No  No     Duration Progress to 45 minutes of aerobic exercise without signs/symptoms of physical distress Progress to 45 minutes of aerobic exercise without signs/symptoms of physical distress Progress to 45 minutes of aerobic exercise without signs/symptoms of physical distress Progress to 45 minutes of aerobic exercise without signs/symptoms of physical distress    Intensity Rest + 30 Rest + 30 Rest + 30 THRR New  THRR 40 - 80% 64 - 128    Progression   Progression Continue to progress workloads to maintain intensity without signs/symptoms of physical distress. Continue to progress workloads to maintain intensity without signs/symptoms of physical distress. Continue to progress workloads to maintain intensity without signs/symptoms of physical distress. Continue to progress workloads to maintain intensity without signs/symptoms of physical distress.    Resistance Training   Training Prescription Yes Yes Yes Yes    Weight _0 Reps 10-15 10-15 10-15 10-15    Interval Training   Interval Training No No No No    Treadmill   MPH  1.5 1.5 1.5 1.5    Grade _1 Minutes _2 Recumbant Bike   Level _3 RPM 40 40 40 40    Watts _4 Minutes _5 15  NuStep   Level _0 Watts _1 Minutes _2 Arm Ergometer   Level _3 Watts _4 Minutes _5 Arm/Foot Ergometer   Level _6 Watts _7 Minutes _8 Recumbant Elliptical   Level _9 RPM 40 40 40 40    Watts _10 Minutes _11 REL-XR   Level _12 Watts 35 35 35 35    Minutes _13 T5 Nustep   Level _14 Watts _15 Minutes _16 Biostep-RELP   Level _17 Watts _18 Minutes _19 Home Exercise Plan   Plans to continue exercise at     [employer] (comment)  [employer] or [employer], pt investigating options      Exercise Comments:     Exercise Comments      11/10/15 1752 11/12/15 1336 12/07/15 1438       Exercise Comments Anthon reports that he is for a repeat heart cath in the am after he had an abnormal stress test. No chest pain during his stress test he said and none in class/exercise time today.  Hairo has attended Heart Track on a regular basis.  He has experienced no difficulty during exercise sessions.  Goal will be to progress with program, taking into consideration outcome of cardiac cath.  Trentan continues to attend program regularly and is progressing in tolerance to exercise without difficulty.  Goal for next month will be to transition to exercise program upon completion of Heart Track.        Discharge Exercise Prescription (Final Exercise Prescription Changes):     Exercise Prescription Changes - 12/16/15 1700    Home Exercise Plan   Plans to continue exercise at [facility] (comment)  [employer] or [employer], pt investigating options      Nutrition:  Target Goals: Understanding of nutrition guidelines,  daily intake of sodium <159m, cholesterol <2059m calories 30% from fat and 7% or less from saturated fats, daily to have 5 or more servings of fruits and vegetables.  Biometrics:     Pre Biometrics - 10/20/15 1643    Pre Biometrics   Height _20  (1.778 m)   Weight (!) 302 lb 11.2 oz (137.304 kg)   Waist Circumference 52.5 inches   Hip Circumference 56.5 inches   Waist to Hip Ratio 0.93 %   BMI (Calculated) 43.5         Post Biometrics - 12/16/15 1649     Post  Biometrics   Height _21  (1.778 m)   Weight 294 lb (133.358 kg)   Waist Circumference 52.5 inches   Hip Circumference 55 inches   Waist to Hip Ratio 0.95 %   BMI (Calculated) 42.3      Nutrition Therapy Plan and Nutrition Goals:     Nutrition Therapy & Goals - 12/02/15 1825    Nutrition Therapy   Diet  1800kcal DASH   Protein (specify units) 8oz   Fiber 30 grams   Whole Grain Foods 3 servings   Saturated Fats 14 max. grams   Fruits and Vegetables 8 servings/day   Sodium 1500 grams   Personal Nutrition Goals   Personal Goal #1 continue with current eating pattern   Comments Patient is avoiding many starches to improve BG control. Advised high fiber, whole grain starches in controlled portions. Encouraged plant-based eating with small portions of lean meats and limited dairy fat.    Intervention Plan   Intervention Prescribe, educate and counsel regarding individualized specific dietary modifications aiming towards targeted core components such as weight, hypertension, lipid management, diabetes, heart failure and other comorbidities.;Nutrition handout(s) given to patient.   Expected Outcomes Short Term Goal: Understand basic principles of dietary content, such as calories, fat, sodium, cholesterol and nutrients.;Short Term Goal: A plan has been developed with personal nutrition goals set during dietitian appointment.;Long Term Goal: Adherence to prescribed nutrition plan.      Nutrition Discharge: Rate Your  Plate Scores:     Nutrition Assessments - 12/16/15 1805    Rate Your Plate Scores   Pre Score 78   Pre Score % 87 %   Post Score 78   Post Score % 87 %   % Change 0 %      Nutrition Goals Re-Evaluation:   Psychosocial: Target Goals: Acknowledge presence or absence of depression, maximize coping skills, provide positive support system. Participant is able to verbalize types and ability to use techniques and skills needed for reducing stress and depression.  Initial Review & Psychosocial Screening:     Initial Psych Review & Screening - 10/20/15 1646    Initial Review   Current issues with --  Patient denies being depressed.  Laughing, talkative, outgoing.  Speaking to others in the hallway when we walked through hallway.     Family Dynamics   Good Support System? Yes  Married x 25 years.  Has 2 children and 2 grand children.  Has church affiliation with [employer] [facility] [employer] [facility].     Barriers   Psychosocial barriers to participate in program There are no identifiable barriers or psychosocial needs.   Screening Interventions   Interventions Encouraged to exercise      Quality of Life Scores:     Quality of Life - 12/16/15 1806    Quality of Life Scores   Health/Function Pre 21.97 %   Health/Function Post 23.5 %   Health/Function % Change 6.96 %   Socioeconomic Pre 27.43 %   Socioeconomic Post 26.57 %   Socioeconomic % Change  -3.14 %   Psych/Spiritual Pre 27.43 %   Psych/Spiritual Post 28.8 %   Psych/Spiritual % Change 4.99 %   Family Pre 30 %   Family Post 28.8 %   Family % Change -4 %   GLOBAL Pre 25.4 %   GLOBAL Post 25.37 %   GLOBAL % Change -0.12 %      PHQ-9:     Recent Review Flowsheet Data    Depression screen [facility] 2/9 12/16/2015 10/20/2015   Decreased Interest 0 0   Down, Depressed, Hopeless 0 0   PHQ - 2 Score 0 0   Altered sleeping 0 1   Tired, decreased energy 1 0   Change in appetite 1 0   Feeling bad or failure about yourself  0 0    Trouble concentrating 0 0   Moving slowly or fidgety/restless 0 0  Suicidal thoughts 0 0   PHQ-9 Score 2 1   Difficult doing work/chores Not difficult at all Somewhat difficult      Psychosocial Evaluation and Intervention:     Psychosocial Evaluation - 11/24/15 1702    Psychosocial Evaluation & Interventions   Interventions Stress management education;Encouraged to exercise with the program and follow exercise prescription   Comments Counselor met with Mr. Brining for initial psychosocial evaluation today.  He is a 60 year old who had a heart attack several months ago  He has a strong support system with  spouse of 26 years; (2) adult children who live close by as well as a mother-in-law.  Mr. Heideman is also actively involved in his local church.  He reports sleeping well and having a diminished appetite since the cardiac procedure.  He denies a history of depression or anxiety or any current symptoms.  Mr. Stgermain states he is typically a positive person and has minimal stress in his life.  His goal for this program is to lose more weight - since he has lost 37 lbs so far, and begin a consistent exercise regimen to a healthier lifestyle.  Mr. Gittleman plans to walk outside and swim more in the future to maintain consistency.  He is encouraged to meet with the dietician to address his weight loss goals.        Psychosocial Re-Evaluation:     Psychosocial Re-Evaluation      10/22/15 0956 12/13/15 1722         Psychosocial Re-Evaluation   Interventions Encouraged to attend Cardiac Rehabilitation for the exercise       Comments Irven Coe called and said the MD told him that he can start Cardiac Rehab. (It is typed in the recent hospitalization discharge summary. Cardiac Enzymes were negative It is documented "Dr. Rockey Situ did not feel like this was angina" in Epic charting note. Gentry Pilson said he really wants to start Cardiac Rehab.  Counselor follow up today with Mr. Stepanek reporting doing  well and loving this program so far, especially the socialization.  He states he continues to sleep well and his mood is stable.  Mr. Beber states he is coping well after the loss of his beloved dog recently and he is actively looking for another pet to enjoy as well.  He plans to join a [employer] program upon completion here at [place].           Vocational Rehabilitation: Provide vocational rehab assistance to qualifying candidates.   Vocational Rehab Evaluation & Intervention:     Vocational Rehab - 10/20/15 1639    Initial Vocational Rehab Evaluation & Intervention   Assessment shows need for Vocational Rehabilitation No      Education: Education Goals: Education classes will be provided on a weekly basis, covering required topics. Participant will state understanding/return demonstration of topics presented.  Learning Barriers/Preferences:     Learning Barriers/Preferences - 10/20/15 1637    Learning Barriers/Preferences   Learning Barriers None   Learning Preferences None      Education Topics: General Nutrition Guidelines/Fats and Fiber: -Group instruction provided by verbal, written material, models and posters to present the general guidelines for heart healthy nutrition. Gives an explanation and review of dietary fats and fiber.          Cardiac Rehab from 12/13/2015 in [facility] Cardiac and Pulmonary Rehab   Date  10/25/15   Educator  PI   Instruction Review Code  2- meets goals/outcomes  Controlling Sodium/Reading Food Labels: -Group verbal and written material supporting the discussion of sodium use in heart healthy nutrition. Review and explanation with models, verbal and written materials for utilization of the food label.      Cardiac Rehab from 12/13/2015 in [facility] Cardiac and Pulmonary Rehab   Date  11/01/15   Educator  PI   Instruction Review Code  2- meets goals/outcomes      Exercise Physiology & Risk Factors: - Group verbal and written  instruction with models to review the exercise physiology of the cardiovascular system and associated critical values. Details cardiovascular disease risk factors and the goals associated with each risk factor.      Cardiac Rehab from 12/13/2015 in [facility] Cardiac and Pulmonary Rehab   Date  11/10/15   Educator  Salley Hews, PT   Instruction Review Code  2- meets goals/outcomes      Aerobic Exercise & Resistance Training: - Gives group verbal and written discussion on the health impact of inactivity. On the components of aerobic and resistive training programs and the benefits of this training and how to safely progress through these programs.      Cardiac Rehab from 12/13/2015 in [facility] Cardiac and Pulmonary Rehab   Date  11/15/15   Educator  BS   Instruction Review Code  2- meets goals/outcomes      Flexibility, Balance, General Exercise Guidelines: - Provides group verbal and written instruction on the benefits of flexibility and balance training programs. Provides general exercise guidelines with specific guidelines to those with heart or lung disease. Demonstration and skill practice provided.      Cardiac Rehab from 12/13/2015 in [facility] Cardiac and Pulmonary Rehab   Date  11/17/15   Educator  S. Bice, RN   Instruction Review Code  2- meets goals/outcomes      Stress Management: - Provides group verbal and written instruction about the health risks of elevated stress, cause of high stress, and healthy ways to reduce stress.      Cardiac Rehab from 12/13/2015 in [facility] Cardiac and Pulmonary Rehab   Date  11/24/15   Educator  Berle Mull, MSW   Instruction Review Code  2- meets goals/outcomes      Depression: - Provides group verbal and written instruction on the correlation between heart/lung disease and depressed mood, treatment options, and the stigmas associated with seeking treatment.      Cardiac Rehab from 12/13/2015 in [facility] Cardiac and Pulmonary Rehab   Date  10/27/15    Educator  Kathreen Cornfield, [facility]   Instruction Review Code  2- meets goals/outcomes      Anatomy & Physiology of the Heart: - Group verbal and written instruction and models provide basic cardiac anatomy and physiology, with the coronary electrical and arterial systems. Review of: AMI, Angina, Valve disease, Heart Failure, Cardiac Arrhythmia, Pacemakers, and the ICD.   Cardiac Procedures: - Group verbal and written instruction and models to describe the testing methods done to diagnose heart disease. Reviews the outcomes of the test results. Describes the treatment choices: Medical Management, Angioplasty, or Coronary Bypass Surgery.      Cardiac Rehab from 12/13/2015 in [facility] Cardiac and Pulmonary Rehab   Date  11/29/15   Educator  SB   Instruction Review Code  2- meets goals/outcomes      Cardiac Medications: - Group verbal and written instruction to review commonly prescribed medications for heart disease. Reviews the medication, class of the drug, and side effects. Includes the steps to  properly store meds and maintain the prescription regimen.      Cardiac Rehab from 12/13/2015 in [facility] Cardiac and Pulmonary Rehab   Date  12/08/15   Educator  DW   Instruction Review Code  2- meets goals/outcomes      Go Sex-Intimacy & Heart Disease, Get SMART - Goal Setting: - Group verbal and written instruction through game format to discuss heart disease and the return to sexual intimacy. Provides group verbal and written material to discuss and apply goal setting through the application of the S.M.A.R.T. Method.      Cardiac Rehab from 12/13/2015 in [facility] Cardiac and Pulmonary Rehab   Date  11/29/15   Educator  SB   Instruction Review Code  2- meets goals/outcomes      Other Matters of the Heart: - Provides group verbal, written materials and models to describe Heart Failure, Angina, Valve Disease, and Diabetes in the realm of heart disease. Includes description of the disease process and treatment  options available to the cardiac patient.      Cardiac Rehab from 12/13/2015 in [facility] Cardiac and Pulmonary Rehab   Date  11/08/15   Educator  SB   Instruction Review Code  2- meets goals/outcomes      Exercise & Equipment Safety: - Individual verbal instruction and demonstration of equipment use and safety with use of the equipment.      Cardiac Rehab from 12/13/2015 in [facility] Cardiac and Pulmonary Rehab   Date  10/20/15   Educator  D. Joya Gaskins, RN   Instruction Review Code  1- partially meets, needs review/practice      Infection Prevention: - Provides verbal and written material to individual with discussion of infection control including proper hand washing and proper equipment cleaning during exercise session.      Cardiac Rehab from 12/13/2015 in [facility] Cardiac and Pulmonary Rehab   Date  10/20/15   Educator  D. Joya Gaskins, RN      Falls Prevention: - Provides verbal and written material to individual with discussion of falls prevention and safety.      Cardiac Rehab from 12/13/2015 in [facility] Cardiac and Pulmonary Rehab   Date  10/20/15   Educator  D. Joya Gaskins, RN   Instruction Review Code  2- meets goals/outcomes      Diabetes: - Individual verbal and written instruction to review signs/symptoms of diabetes, desired ranges of glucose level fasting, after meals and with exercise. Advice that pre and post exercise glucose checks will be done for 3 sessions at entry of program.      Cardiac Rehab from 12/13/2015 in [facility] Cardiac and Pulmonary Rehab   Date  10/20/15   Educator  D. Joya Gaskins, RN   Instruction Review Code  1- partially meets, needs review/practice       Knowledge Questionnaire Score:     Knowledge Questionnaire Score - 12/16/15 1806    Knowledge Questionnaire Score   Pre Score 22/28   Post Score 25/28      Core Components/Risk Factors/Patient Goals at Admission:     Personal Goals and Risk Factors at Admission - 10/20/15 1641    Core Components/Risk Factors/Patient  Goals on Admission    Weight Management Yes;Obesity   Intervention Weight Management/Obesity: Establish reasonable short term and long term weight goals.;Obesity: Provide education and appropriate resources to help participant work on and attain dietary goals.   Admit Weight 302 lb 11.2 oz (137.304 kg)   Goal Weight: Short Term 290 lb (131.543 kg)  Lose  1 - 2 pounds per week.     Goal Weight: Long Term 200 lb (90.719 kg)   Expected Outcomes Short Term: Continue to assess and modify interventions until short term weight is achieved;Long Term: Adherence to nutrition and physical activity/exercise program aimed toward attainment of established weight goal;Weight Loss: Understanding of general recommendations for a balanced deficit meal plan, which promotes 1-2 lb weight loss per week and includes a negative energy balance of [phone] kcal/d;Understanding recommendations for meals to include 15-35% energy as protein, 25-35% energy from fat, 35-60% energy from carbohydrates, less than 254m of dietary cholesterol, 20-35 gm of total fiber daily;Understanding of distribution of calorie intake throughout the day with the consumption of 4-5 meals/snacks   Sedentary Yes   Intervention Provide advice, education, support and counseling about physical activity/exercise needs.;Develop an individualized exercise prescription for aerobic and resistive training based on initial evaluation findings, risk stratification, comorbidities and participant's personal goals.   Expected Outcomes Achievement of increased cardiorespiratory fitness and enhanced flexibility, muscular endurance and strength shown through measurements of functional capacity and personal statement of participant.   Increase Strength and Stamina Yes   Intervention Provide advice, education, support and counseling about physical activity/exercise needs.;Develop an individualized exercise prescription for aerobic and resistive training based on initial  evaluation findings, risk stratification, comorbidities and participant's personal goals.   Expected Outcomes Achievement of increased cardiorespiratory fitness and enhanced flexibility, muscular endurance and strength shown through measurements of functional capacity and personal statement of participant.   Diabetes Yes   Intervention Provide education about signs/symptoms and action to take for hypo/hyperglycemia.;Provide education about proper nutrition, including hydration, and aerobic/resistive exercise prescription along with prescribed medications to achieve blood glucose in normal ranges: Fasting glucose 65-99 mg/dL   Expected Outcomes Short Term: Participant verbalizes understanding of the signs/symptoms and immediate care of hyper/hypoglycemia, proper foot care and importance of medication, aerobic/resistive exercise and nutrition plan for blood glucose control.;Long Term: Attainment of HbA1C < 7%.   Hypertension Yes   Intervention Provide education on lifestyle modifcations including regular physical activity/exercise, weight management, moderate sodium restriction and increased consumption of fresh fruit, vegetables, and low fat dairy, alcohol moderation, and smoking cessation.;Monitor prescription use compliance.   Expected Outcomes Short Term: Continued assessment and intervention until BP is < 140/921mHG in hypertensive participants. < 130/8028mG in hypertensive participants with diabetes, heart failure or chronic kidney disease.;Long Term: Maintenance of blood pressure at goal levels.   Lipids Yes   Intervention Provide education and support for participant on nutrition & aerobic/resistive exercise along with prescribed medications to achieve LDL <38m26mDL >40mg64mExpected Outcomes Short Term: Participant states understanding of desired cholesterol values and is compliant with medications prescribed. Participant is following exercise prescription and nutrition guidelines.;Long Term:  Cholesterol controlled with medications as prescribed, with individualized exercise RX and with personalized nutrition plan. Value goals: LDL < 38mg,80m > 40 mg.      Core Components/Risk Factors/Patient Goals Review:      Goals and Risk Factor Review      11/10/15 1752           Core Components/Risk Factors/Patient Goals Review   Personal Goals Review Sedentary       Review Leiby rLenfordts that he is for a repeat heart cath in the am after he had an abnormal stress test. No chest pain during his stress test he said and none in class/exercise time today.        Expected Outcomes To be  able to return to Cardiac Rehab to exercise.           Core Components/Risk Factors/Patient Goals at Discharge (Final Review):      Goals and Risk Factor Review - 11/10/15 1752    Core Components/Risk Factors/Patient Goals Review   Personal Goals Review Sedentary   Review Duwan reports that he is for a repeat heart cath in the am after he had an abnormal stress test. No chest pain during his stress test he said and none in class/exercise time today.    Expected Outcomes To be able to return to Cardiac Rehab to exercise.       ITP Comments:     ITP Comments      10/22/15 0955 11/10/15 1751 11/14/15 1038 11/15/15 1607 12/12/15 1119   ITP Comments Irven Coe called and said the MD told him that he can start Cardiac Rehab. (It is typed in the recent hospitalization discharge summary. Cardiac Enzymes were negative It is documented "Dr. Rockey Situ did not feel like this was angina" in Epic charting note.  Saivion reports that he is for a repeat heart cath in the am after he had an abnormal stress test. No chest pain during his stress test he said and none in class/exercise time today.  30 Day review. Continue with ITP  New to program [place] reports he had another cardiac cath done last week.  Reoprted to him, all was fine.  He will be decreasing his hours at work per MD prescription. 30 day review. Continue with ITP       Comments: Edgardo completed Cardiac Reha Program at [facility] today.  He attended all 36 sessions.  He plans to continue exercising at [facility] with his son.  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

## 2015-12-23 NOTE — Progress Notes (Signed)
Discharge Summary  Patient Details  Name: Michael Jordan MRN: [id number] Date of Birth: [birth year] Referring Provider:     Number of Visits:    Reason for Discharge:  Patient reached a stable level of exercise. Patient independent in their exercise.  Smoking History:  History  Smoking status  . Former Smoker -- 0.50 packs/day for 44 years  . Types: Cigarettes  . Quit date: 06/02/2015  Smokeless tobacco  . Not on file    Diagnosis:  ST elevation myocardial infarction (STEMI), unspecified artery (HCC)  Status post coronary artery stent placement  ADL [facility]SD:   Initial Exercise Prescription:     Initial Exercise Prescription - 10/22/15 0900    Date of Initial Exercise RX and Referring Provider   Date --  Carl Best called- said the MD told him that he can start       Discharge Exercise Prescription (Final Exercise Prescription Changes):     Exercise Prescription Changes - 12/16/15 1700    Home Exercise Plan   Plans to continue exercise at [facility] (comment)  [employer] or [employer], pt investigating options      Functional Capacity:     6 Minute Walk      10/20/15 1644 12/16/15 1650     6 Minute Walk   Phase Initial Discharge    Distance 1360 feet 1510 feet    Distance % Change  11 %    Walk Time 6 minutes     # of Rest Breaks 0     RPE 12 14    Symptoms Yes (comment)     Comments SOB, tingling in arms and legs, mild jaw pain. Was taken to the ER due to these symptoms     Resting HR 89 bpm 88 bpm    Resting BP 146/80 mmHg 144/78 mmHg    Max Ex. HR 116 bpm 134 bpm    Max Ex. BP 158/82 mmHg 160/72 mmHg       Psychological, QOL, Others - Outcomes: PHQ 2/9: Depression screen [facility] 2/9 12/16/2015 10/20/2015  Decreased Interest 0 0  Down, Depressed, Hopeless 0 0  PHQ - 2 Score 0 0  Altered sleeping 0 1  Tired, decreased energy 1 0  Change in appetite 1 0  Feeling bad or failure about yourself  0 0  Trouble concentrating 0 0  Moving  slowly or fidgety/restless 0 0  Suicidal thoughts 0 0  PHQ-9 Score 2 1  Difficult doing work/chores Not difficult at all Somewhat difficult    Quality of Life:     Quality of Life - 12/16/15 1806    Quality of Life Scores   Health/Function Pre 21.97 %   Health/Function Post 23.5 %   Health/Function % Change 6.96 %   Socioeconomic Pre 27.43 %   Socioeconomic Post 26.57 %   Socioeconomic % Change  -3.14 %   Psych/Spiritual Pre 27.43 %   Psych/Spiritual Post 28.8 %   Psych/Spiritual % Change 4.99 %   Family Pre 30 %   Family Post 28.8 %   Family % Change -4 %   GLOBAL Pre 25.4 %   GLOBAL Post 25.37 %   GLOBAL % Change -0.12 %      Personal Goals: Goals established at orientation with interventions provided to work toward goal.     Personal Goals and Risk Factors at Admission - 10/20/15 1641    Core Components/Risk Factors/Patient Goals on Admission    Weight Management Yes;Obesity  Intervention Weight Management/Obesity: Establish reasonable short term and long term weight goals.;Obesity: Provide education and appropriate resources to help participant work on and attain dietary goals.   Admit Weight 302 lb 11.2 oz (137.304 kg)   Goal Weight: Short Term 290 lb (131.543 kg)  Lose 1 - 2 pounds per week.     Goal Weight: Long Term 200 lb (90.719 kg)   Expected Outcomes Short Term: Continue to assess and modify interventions until short term weight is achieved;Long Term: Adherence to nutrition and physical activity/exercise program aimed toward attainment of established weight goal;Weight Loss: Understanding of general recommendations for a balanced deficit meal plan, which promotes 1-2 lb weight loss per week and includes a negative energy balance of [phone] kcal/d;Understanding recommendations for meals to include 15-35% energy as protein, 25-35% energy from fat, 35-60% energy from carbohydrates, less than 200mg  of dietary cholesterol, 20-35 gm of total fiber daily;Understanding  of distribution of calorie intake throughout the day with the consumption of 4-5 meals/snacks   Sedentary Yes   Intervention Provide advice, education, support and counseling about physical activity/exercise needs.;Develop an individualized exercise prescription for aerobic and resistive training based on initial evaluation findings, risk stratification, comorbidities and participant's personal goals.   Expected Outcomes Achievement of increased cardiorespiratory fitness and enhanced flexibility, muscular endurance and strength shown through measurements of functional capacity and personal statement of participant.   Increase Strength and Stamina Yes   Intervention Provide advice, education, support and counseling about physical activity/exercise needs.;Develop an individualized exercise prescription for aerobic and resistive training based on initial evaluation findings, risk stratification, comorbidities and participant's personal goals.   Expected Outcomes Achievement of increased cardiorespiratory fitness and enhanced flexibility, muscular endurance and strength shown through measurements of functional capacity and personal statement of participant.   Diabetes Yes   Intervention Provide education about signs/symptoms and action to take for hypo/hyperglycemia.;Provide education about proper nutrition, including hydration, and aerobic/resistive exercise prescription along with prescribed medications to achieve blood glucose in normal ranges: Fasting glucose 65-99 mg/dL   Expected Outcomes Short Term: Participant verbalizes understanding of the signs/symptoms and immediate care of hyper/hypoglycemia, proper foot care and importance of medication, aerobic/resistive exercise and nutrition plan for blood glucose control.;Long Term: Attainment of HbA1C < 7%.   Hypertension Yes   Intervention Provide education on lifestyle modifcations including regular physical activity/exercise, weight management, moderate  sodium restriction and increased consumption of fresh fruit, vegetables, and low fat dairy, alcohol moderation, and smoking cessation.;Monitor prescription use compliance.   Expected Outcomes Short Term: Continued assessment and intervention until BP is < 140/3390mm HG in hypertensive participants. < 130/7980mm HG in hypertensive participants with diabetes, heart failure or chronic kidney disease.;Long Term: Maintenance of blood pressure at goal levels.   Lipids Yes   Intervention Provide education and support for participant on nutrition & aerobic/resistive exercise along with prescribed medications to achieve LDL 70mg , HDL >40mg .   Expected Outcomes Short Term: Participant states understanding of desired cholesterol values and is compliant with medications prescribed. Participant is following exercise prescription and nutrition guidelines.;Long Term: Cholesterol controlled with medications as prescribed, with individualized exercise RX and with personalized nutrition plan. Value goals: LDL < 70mg , HDL > 40 mg.       Personal Goals Discharge:     Goals and Risk Factor Review      11/10/15 1752           Core Components/Risk Factors/Patient Goals Review   Personal Goals Review Sedentary       Review  Ebubechukwu reports that he is for a repeat heart cath in the am after he had an abnormal stress test. No chest pain during his stress test he said and none in class/exercise time today.        Expected Outcomes To be able to return to Cardiac Rehab to exercise.           Nutrition & Weight - Outcomes:     Pre Biometrics - 10/20/15 1643    Pre Biometrics   Height  (1.778 m)   Weight (!) 302 lb 11.2 oz (137.304 kg)   Waist Circumference 52.5 inches   Hip Circumference 56.5 inches   Waist to Hip Ratio 0.93 %   BMI (Calculated) 43.5         Post Biometrics - 12/16/15 1649     Post  Biometrics   Height  (1.778 m)   Weight 294 lb (133.358 kg)   Waist Circumference 52.5 inches    Hip Circumference 55 inches   Waist to Hip Ratio 0.95 %   BMI (Calculated) 42.3      Nutrition:     Nutrition Therapy & Goals - 12/02/15 1825    Nutrition Therapy   Diet 1800kcal DASH   Protein (specify units) 8oz   Fiber 30 grams   Whole Grain Foods 3 servings   Saturated Fats 14 max. grams   Fruits and Vegetables 8 servings/day   Sodium 1500 grams   Personal Nutrition Goals   Personal Goal #1 continue with current eating pattern   Comments Patient is avoiding many starches to improve BG control. Advised high fiber, whole grain starches in controlled portions. Encouraged plant-based eating with small portions of lean meats and limited dairy fat.    Intervention Plan   Intervention Prescribe, educate and counsel regarding individualized specific dietary modifications aiming towards targeted core components such as weight, hypertension, lipid management, diabetes, heart failure and other comorbidities.;Nutrition handout(s) given to patient.   Expected Outcomes Short Term Goal: Understand basic principles of dietary content, such as calories, fat, sodium, cholesterol and nutrients.;Short Term Goal: A plan has been developed with personal nutrition goals set during dietitian appointment.;Long Term Goal: Adherence to prescribed nutrition plan.      Nutrition Discharge:     Nutrition Assessments - 12/16/15 1805    Rate Your Plate Scores   Pre Score 78   Pre Score % 87 %   Post Score 78   Post Score % 87 %   % Change 0 %      Education Questionnaire Score:     Knowledge Questionnaire Score - 12/16/15 1806    Knowledge Questionnaire Score   Pre Score 22/28   Post Score 25/28      Goals reviewed with patient; copy given to patient.

## 2016-01-11 ENCOUNTER — Encounter: Payer: Self-pay | Admitting: Cardiovascular Disease

## 2016-01-11 ENCOUNTER — Encounter: Payer: Self-pay | Admitting: Family Medicine

## 2016-01-11 ENCOUNTER — Ambulatory Visit (INDEPENDENT_AMBULATORY_CARE_PROVIDER_SITE_OTHER): Payer: 59 | Admitting: Family Medicine

## 2016-01-11 ENCOUNTER — Ambulatory Visit: Payer: 59 | Admitting: Cardiovascular Disease

## 2016-01-11 ENCOUNTER — Ambulatory Visit (INDEPENDENT_AMBULATORY_CARE_PROVIDER_SITE_OTHER): Payer: 59 | Admitting: Cardiovascular Disease

## 2016-01-11 VITALS — BP 140/100 | HR 80 | Ht 70.0 in | Wt 299.1 lb

## 2016-01-11 VITALS — BP 136/76 | HR 89 | Temp 98.1°F | Ht 70.0 in | Wt 301.8 lb

## 2016-01-11 DIAGNOSIS — E119 Type 2 diabetes mellitus without complications: Secondary | ICD-10-CM

## 2016-01-11 DIAGNOSIS — Z23 Encounter for immunization: Secondary | ICD-10-CM

## 2016-01-11 DIAGNOSIS — E1165 Type 2 diabetes mellitus with hyperglycemia: Secondary | ICD-10-CM | POA: Diagnosis not present

## 2016-01-11 DIAGNOSIS — E1159 Type 2 diabetes mellitus with other circulatory complications: Secondary | ICD-10-CM | POA: Diagnosis not present

## 2016-01-11 DIAGNOSIS — I2510 Atherosclerotic heart disease of native coronary artery without angina pectoris: Secondary | ICD-10-CM

## 2016-01-11 DIAGNOSIS — Z1159 Encounter for screening for other viral diseases: Secondary | ICD-10-CM

## 2016-01-11 DIAGNOSIS — IMO0002 Reserved for concepts with insufficient information to code with codable children: Secondary | ICD-10-CM

## 2016-01-11 LAB — MICROALBUMIN / CREATININE URINE RATIO
Creatinine,U: 198.3 mg/dL
Microalb Creat Ratio: 0.4 mg/g (ref 0.0–30.0)
Microalb, Ur: 0.8 mg/dL (ref 0.0–1.9)

## 2016-01-11 LAB — HEMOGLOBIN A1C: Hgb A1c MFr Bld: 7 % — ABNORMAL HIGH (ref 4.6–6.5)

## 2016-01-11 MED ORDER — CARVEDILOL 6.25 MG PO TABS: 6.2500 mg | ORAL_TABLET | Freq: Two times a day (BID) | ORAL | Status: DC

## 2016-01-11 NOTE — Progress Notes (Signed)
Cardiology Office Note   Date:  01/11/2016   ID:  Michael Jordan, DOB 21-Aug-1955, MRN [id number]  PCP:  [name], MD  Cardiologist:   Lorine Bears, MD   Chief Complaint  Patient presents with  . Follow-up    no cp, sob or swelling. no other complaints.      History of Present Illness:  Michael Jordan is a 60 y.o. male who presents for a follow-up visit regarding coronary artery disease.  He was hospitalized in March 2017 with  anterior ST elevation myocardial infarction. Cardiac catheterization showed occluded proximal LAD with mild disease affecting the diagonals, chronically occluded right coronary artery with left to right collaterals and moderate left circumflex disease. He underwent successful angioplasty and drug-eluting stent placement in the proximal LAD. Ejection fraction was 40-45% by LV gram and 35-40% by echocardiogram.  He has other chronic medical conditions that include morbid obesity, hypertension, hyperlipidemia and recent diagnosis of type 2 diabetes. He quit smoking in November 2016. 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 The test was abnormal with significant ST depression with possible anterior wall ischemia. Thus, he underwent a repeat cardiac catheterization which showed patent LAD stent with no significant change in coronary anatomy. Ejection fraction was 45%. The patient resumed work and he has not had any recurrent chest pain or significant dyspnea. He had cardiac rehabilitation. He lost about 7 pounds and he is trying to eat healthier.   Past Medical History  Diagnosis Date  . CAD (coronary artery disease)     a. 09/2015 Ant STEMI/PCI: LM nl, LAD 100p (3.0x18 Xience Alpine DES), D1 40, SP1 40ost, D3 min irregs, LCX 44m, OM1 nl, OM3 min irregs, RCA 80p/167m (CTO)->fills via collats, EF 45-50%.  . Cardiomyopathy, ischemic     a. 09/2015 Echo: EF 35-40%, sev apical, periapical, antapical HK, mild to mod  ant, inf HK, Gr1 DD.  . Morbid obesity (HCC)   . Tobacco abuse     a. Quit 05/2015.  Marland Kitchen Hyperlipidemia   . Diabetes [facility])     Past Surgical History  Procedure Laterality Date  . Tonsillectomy    . Cardiac catheterization N/A 09/30/2015    Procedure: Left Heart Cath and Coronary Angiography;  Surgeon: Iran Ouch, MD;  Location: [facility] INVASIVE CV LAB;  Service: Cardiovascular;  Laterality: N/A;  . Cardiac catheterization N/A 09/30/2015    Procedure: Coronary Stent Intervention;  Surgeon: Iran Ouch, MD;  Location: [facility] INVASIVE CV LAB;  Service: Cardiovascular;  Laterality: N/A;  . Cardiac catheterization N/A 11/11/2015    Procedure: Left Heart Cath and Coronary Angiography;  Surgeon: Iran Ouch, MD;  Location: [facility] INVASIVE CV LAB;  Service: Cardiovascular;  Laterality: N/A;     Current Outpatient Prescriptions  Medication Sig Dispense Refill  . aspirin 81 MG chewable tablet Chew 1 tablet (81 mg total) by mouth daily.    Marland Kitchen atorvastatin (LIPITOR) 80 MG tablet Take 1 tablet (80 mg total) by mouth daily at 6 PM. 30 tablet 6  . carvedilol (COREG) 3.125 MG tablet Take 1 tablet (3.125 mg total) by mouth 2 (two) times daily with a meal. 60 tablet 6  . lisinopril (PRINIVIL,ZESTRIL) 5 MG tablet Take 1 tablet (5 mg total) by mouth daily. 30 tablet 6  . metFORMIN (GLUCOPHAGE) 1000 MG tablet Take 1 tablet (1,000 mg total) by mouth 2 (two) times daily with a meal. 180 tablet 3  . nitroGLYCERIN (NITROSTAT)  0.4 MG SL tablet Place 1 tablet (0.4 mg total) under the tongue every 5 (five) minutes as needed for chest pain. 25 tablet 3  . spironolactone (ALDACTONE) 25 MG tablet Take 0.5 tablets (12.5 mg total) by mouth daily. 30 tablet 6  . ticagrelor (BRILINTA) 90 MG TABS tablet Take 1 tablet (90 mg total) by mouth 2 (two) times daily. 60 tablet 6   No current facility-administered medications for this visit.    Allergies:   Review of patient's allergies indicates no known allergies.     Social History:  The patient  reports that he quit smoking about 7 months ago. His smoking use included Cigarettes. He has a 22 pack-year smoking history. He does not have any smokeless tobacco history on file. He reports that he drinks alcohol. He reports that he does not use illicit drugs.   Family History:  The patient's family history includes Diabetes in his mother; Heart attack in his father, mother, and paternal grandfather.    ROS:  Please see the history of present illness.   Otherwise, review of systems are positive for none.   All other systems are reviewed and negative.    PHYSICAL EXAM: VS:  BP 140/100 mmHg  Pulse 80  Ht 5\' 10"  (1.778 m)  Wt 299 lb 1.9 oz (135.68 kg)  BMI 42.92 kg/m2 , BMI Body mass index is 42.92 kg/(m^2). GEN: Well nourished, well developed, in no acute distress HEENT: normal Neck: no JVD, carotid bruits, or masses Cardiac: RRR; no murmurs, rubs, or gallops,no edema  Respiratory:  clear to auscultation bilaterally, normal work of breathing GI: soft, nontender, nondistended, + BS MS: no deformity or atrophy Skin: warm and dry, no rash Neuro:  Strength and sensation are intact Psych: euthymic mood, full affect Right radial pulse is normal with no hematoma.  EKG:  EKG is not ordered today.    Recent Labs: 11/01/2015: ALT 31 11/09/2015: BUN 17; Creatinine, Ser 0.95; Hemoglobin 14.2; Platelets 231; Potassium 4.2; Sodium 136    Lipid Panel    Component Value Date/Time   CHOL 118 11/01/2015 0911   TRIG 115 11/01/2015 0911   HDL 37* 11/01/2015 0911   CHOLHDL 3.2 11/01/2015 0911   VLDL 23 11/01/2015 0911   LDLCALC 58 11/01/2015 0911      Wt Readings from Last 3 Encounters:  01/11/16 299 lb 1.9 oz (135.68 kg)  01/11/16 301 lb 12.8 oz (136.896 kg)  12/16/15 294 lb (133.358 kg)        ASSESSMENT AND PLAN:  1.  Coronary artery disease involving native coronary arteries without angina: He is doing very well with no anginal symptoms.  Continue dual antiplatelet therapy for at least one year until March 2018. Continue with healthy lifestyle changes.   2. Hyperlipidemia: Lipid profile showed significant improvement with high dose atorvastatin with an LDL of 58.  3. Ischemic cardiomyopathy: Most recent ejection fraction was 45%. I increased the dose of carvedilol to 6.25 mg twice daily. Continue lisinopril and small dose spironolactone.   4. Essential hypertension: Blood pressure ismildly elevated. Carvedilol was increased as outlined above.     Disposition:   FU with me in 6 months  Signed,  Lorine BearsMuhammad Adella Manolis, MD  01/11/2016 1:24 PM    [facility]

## 2016-01-11 NOTE — Patient Instructions (Signed)
Nice to see you. Please continue to work on diet and exercise. I have included diet instructions below. Please continue to increase her exercise as tolerated. Please see an ophthalmologist for an eye exam regarding her diabetes. If you develop chest pain, shortness of breath, palpitations, or any new or changing symptoms please seek medical attention.

## 2016-01-11 NOTE — Assessment & Plan Note (Signed)
Doing well. Asymptomatic. Continue current medications. Continue to follow with cardiology.

## 2016-01-11 NOTE — Patient Instructions (Signed)
Medication Instructions:  Your physician has recommended you make the following change in your medication:  INCREASE coreg to 6.25mg  twice daily    Labwork: none  Testing/Procedures: none  Follow-Up: Your physician wants you to follow-up in: six months with Dr. Kirke CorinArida.  You will receive a reminder letter in the mail two months in advance. If you don't receive a letter, please call our office to schedule the follow-up appointment.   Any Other Special Instructions Will Be Listed Below (If Applicable).     If you need a refill on your cardiac medications before your next appointment, please call your pharmacy.

## 2016-01-11 NOTE — Progress Notes (Signed)
Patient ID: Michael RifeHugh L Jordan, male   DOB: [birth year], 60 y.o.   MRN: [id number]  Michael AlarEric Michael Yore, MD Phone: [phone]  Michael Jordan is a 60 y.o. male who presents today for follow-up.  CAD: Patient notes no recurrent chest pain. Notes no significant shortness of breath. Does note some shortness of breath with extreme exertion though otherwise is breathing well. No palpitations. Has finished cardiac rehabilitation. Is back to working half time. No longer smoking.  DIABETES Disease Monitoring: Blood Sugar ranges-not checking Polyuria/phagia/dipsia- no      ophthalmology- has not seen yet Medications: Compliance- taking metformin Hypoglycemic symptoms- no  Obesity: Patient reports he is starting to exercise by walking. He is going to get a gym set up at home. He is also going swimming this summer. He notes not really watching what he eats. Does eat red meat.  PMH: Former smoker   ROS see history of present illness  Objective  Physical Exam Filed Vitals:   01/11/16 0908  BP: 136/76  Pulse: 89  Temp: 98.1 F (36.7 C)    BP Readings from Last 3 Encounters:  01/11/16 140/100  01/11/16 136/76  11/15/15 112/70   Wt Readings from Last 3 Encounters:  01/11/16 299 lb 1.9 oz (135.68 kg)  01/11/16 301 lb 12.8 oz (136.896 kg)  12/16/15 294 lb (133.358 kg)    Physical Exam  Constitutional: He is well-developed, well-nourished, and in no distress.  HENT:  Head: Normocephalic and atraumatic.  Cardiovascular: Normal rate, regular rhythm and normal heart sounds.   Pulmonary/Chest: Effort normal and breath sounds normal.  Neurological: He is alert. Gait normal.  Skin: Skin is warm and dry. He is not diaphoretic.  Bilateral feet with no skin breakdown or ulcerations, 2+ DP and PT pulses, sensation to monofilament intact     Assessment/Plan: Please see individual problem list.  CAD (coronary artery disease) Doing well. Asymptomatic. Continue current medications. Continue to follow  with cardiology.  Uncontrolled type 2 diabetes mellitus with circulatory disorder (HCC) Tolerating medication. Last A1c was 8. Recheck A1c today. Continue medication. Advised to see an ophthalmologist. Foot exam completed today. Discussed pneumonia vaccine given he has diabetes are deferred.  Morbid obesity (HCC) Advised on diet and exercise.    Orders Placed This Encounter  Procedures  . Tdap vaccine greater than or equal to 7yo IM  . Hepatitis C Antibody  . HgB A1c  . Urine Microalbumin w/creat. ratio   Hepatitis C checked today. Tdap given.  Michael AlarEric Michael Keesey, MD [facility] [place]

## 2016-01-11 NOTE — Assessment & Plan Note (Signed)
Tolerating medication. Last A1c was 8. Recheck A1c today. Continue medication. Advised to see an ophthalmologist. Foot exam completed today. Discussed pneumonia vaccine given he has diabetes are deferred.

## 2016-01-11 NOTE — Progress Notes (Signed)
Pre visit review using our clinic review tool, if applicable. No additional management support is needed unless otherwise documented below in the visit note. 

## 2016-01-11 NOTE — Assessment & Plan Note (Signed)
Advised on diet and exercise. 

## 2016-01-12 ENCOUNTER — Encounter: Payer: Self-pay | Admitting: Family Medicine

## 2016-01-12 LAB — HEPATITIS C ANTIBODY: HCV Ab: NEGATIVE

## 2016-04-08 ENCOUNTER — Other Ambulatory Visit: Payer: Self-pay | Admitting: Nurse Practitioner

## 2016-04-12 ENCOUNTER — Ambulatory Visit (INDEPENDENT_AMBULATORY_CARE_PROVIDER_SITE_OTHER): Payer: 59 | Admitting: Family Medicine

## 2016-04-12 ENCOUNTER — Encounter: Payer: Self-pay | Admitting: Family Medicine

## 2016-04-12 VITALS — BP 130/74 | HR 83 | Temp 98.7°F | Wt 309.4 lb

## 2016-04-12 DIAGNOSIS — E1159 Type 2 diabetes mellitus with other circulatory complications: Secondary | ICD-10-CM

## 2016-04-12 DIAGNOSIS — E1165 Type 2 diabetes mellitus with hyperglycemia: Secondary | ICD-10-CM

## 2016-04-12 DIAGNOSIS — IMO0002 Reserved for concepts with insufficient information to code with codable children: Secondary | ICD-10-CM

## 2016-04-12 DIAGNOSIS — I2510 Atherosclerotic heart disease of native coronary artery without angina pectoris: Secondary | ICD-10-CM

## 2016-04-12 LAB — HEMOGLOBIN A1C: Hgb A1c MFr Bld: 7.7 % — ABNORMAL HIGH (ref 4.6–6.5)

## 2016-04-12 NOTE — Assessment & Plan Note (Signed)
Has had a setback with regards to his diet and exercise. He continues to take metformin. We'll check an A1c today. Encouraged diet and exercise. He'll follow-up in 3 months.

## 2016-04-12 NOTE — Assessment & Plan Note (Signed)
This issue has worsened. He has gained 10 pounds. I encouraged diet and exercise. He wants to start this week.

## 2016-04-12 NOTE — Progress Notes (Signed)
Pre visit review using our clinic review tool, if applicable. No additional management support is needed unless otherwise documented below in the visit note. 

## 2016-04-12 NOTE — Patient Instructions (Signed)
Nice to see you. We'll get an A1c today and call you with the results. You should start exercising and your diet again. If you develop chest pain or shortness of breath and seek medical attention.

## 2016-04-12 NOTE — Progress Notes (Signed)
  Marikay AlarEric Sonnenberg, MD Phone: [phone]  Genella RifeHugh L Jordan is a 60 y.o. male who presents today for f/u.  DIABETES Disease Monitoring: Blood Sugar ranges-not checking Polyuria/phagia/dipsia- no      Optho- has not seen yet Medications: Compliance- taking metformin Hypoglycemic symptoms- no  Obesity: Has stopped exercising since he got out of cardiac rehabilitation. Has stopped working on his diet. Is getting it back to eating less bread and pasta and rice. He has put on 10 pounds.  CAD: No chest pain or shortness of breath. Has finished with cardiac rehabilitation. No cardiac complaints.  PMH: Former smoker.   ROS see history of present illness  Objective  Physical Exam Vitals:   04/12/16 0859  BP: 130/74  Pulse: 83  Temp: 98.7 F (37.1 C)    BP Readings from Last 3 Encounters:  04/12/16 130/74  01/11/16 (!) 140/100  01/11/16 136/76   Wt Readings from Last 3 Encounters:  04/12/16 (!) 309 lb 6.4 oz (140.3 kg)  01/11/16 299 lb 1.9 oz (135.7 kg)  01/11/16 (!) 301 lb 12.8 oz (136.9 kg)    Physical Exam  Constitutional: No distress.  Obese  Cardiovascular: Normal rate, regular rhythm and normal heart sounds.   Pulmonary/Chest: Effort normal and breath sounds normal.  Neurological: He is alert. Gait normal.  Skin: Skin is warm and dry. He is not diaphoretic.     Assessment/Plan: Please see individual problem list.  Morbid obesity (HCC) This issue has worsened. He has gained 10 pounds. I encouraged diet and exercise. He wants to start this week.  CAD (coronary artery disease) Doing well. Asymptomatic. Continue to follow with cardiology.  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 He continues to take metformin. We'll check an A1c today. Encouraged diet and exercise. He'll follow-up in 3 months.   Orders Placed This Encounter  Procedures  . HgB A1c    Marikay AlarEric Sonnenberg,  MD [facility] [place]

## 2016-04-12 NOTE — Assessment & Plan Note (Signed)
Doing well. Asymptomatic. Continue to follow with cardiology.

## 2016-04-25 ENCOUNTER — Other Ambulatory Visit: Payer: Self-pay | Admitting: Family Medicine

## 2016-04-25 MED ORDER — EMPAGLIFLOZIN 10 MG PO TABS: 10.0000 mg | ORAL_TABLET | Freq: Every day | ORAL | 3 refills | Status: DC

## 2016-07-13 ENCOUNTER — Ambulatory Visit (INDEPENDENT_AMBULATORY_CARE_PROVIDER_SITE_OTHER): Payer: 59 | Admitting: Family Medicine

## 2016-07-13 ENCOUNTER — Encounter: Payer: Self-pay | Admitting: Family Medicine

## 2016-07-13 ENCOUNTER — Telehealth: Payer: Self-pay

## 2016-07-13 VITALS — BP 138/80 | HR 77 | Temp 97.8°F | Wt 306.8 lb

## 2016-07-13 DIAGNOSIS — I10 Essential (primary) hypertension: Secondary | ICD-10-CM | POA: Diagnosis not present

## 2016-07-13 DIAGNOSIS — E1159 Type 2 diabetes mellitus with other circulatory complications: Secondary | ICD-10-CM | POA: Diagnosis not present

## 2016-07-13 DIAGNOSIS — K6289 Other specified diseases of anus and rectum: Secondary | ICD-10-CM | POA: Insufficient documentation

## 2016-07-13 DIAGNOSIS — IMO0002 Reserved for concepts with insufficient information to code with codable children: Secondary | ICD-10-CM

## 2016-07-13 DIAGNOSIS — E1165 Type 2 diabetes mellitus with hyperglycemia: Secondary | ICD-10-CM | POA: Diagnosis not present

## 2016-07-13 DIAGNOSIS — I152 Hypertension secondary to endocrine disorders: Secondary | ICD-10-CM | POA: Insufficient documentation

## 2016-07-13 DIAGNOSIS — I2510 Atherosclerotic heart disease of native coronary artery without angina pectoris: Secondary | ICD-10-CM

## 2016-07-13 LAB — COMPREHENSIVE METABOLIC PANEL
ALT: 21 U/L (ref 0–53)
AST: 35 U/L (ref 0–37)
Albumin: 4.5 g/dL (ref 3.5–5.2)
Alkaline Phosphatase: 85 U/L (ref 39–117)
BUN: 19 mg/dL (ref 6–23)
CO2: 29 mEq/L (ref 19–32)
Calcium: 9.4 mg/dL (ref 8.4–10.5)
Chloride: 102 mEq/L (ref 96–112)
Creatinine, Ser: 0.94 mg/dL (ref 0.40–1.50)
GFR: 86.53 mL/min (ref >60.00)
Glucose, Bld: 155 mg/dL — ABNORMAL HIGH (ref 70–99)
Potassium: 4.6 mEq/L (ref 3.5–5.1)
Sodium: 138 mEq/L (ref 135–145)
Total Bilirubin: 0.6 mg/dL (ref 0.2–1.2)
Total Protein: 7 g/dL (ref 6.0–8.3)

## 2016-07-13 LAB — HEMOGLOBIN A1C: Hgb A1c MFr Bld: 7.6 % — ABNORMAL HIGH (ref 4.6–6.5)

## 2016-07-13 LAB — CBC
HCT: 42.8 % (ref 39.0–52.0)
Hemoglobin: 14.5 g/dL (ref 13.0–17.0)
MCHC: 33.9 g/dL (ref 30.0–36.0)
MCV: 87.4 fl (ref 78.0–100.0)
Platelets: 278 10*3/uL (ref 150.0–400.0)
RBC: 4.89 Mil/uL (ref 4.22–5.81)
RDW: 14.8 % (ref 11.5–15.5)
WBC: 11.5 10*3/uL — ABNORMAL HIGH (ref 4.0–10.5)

## 2016-07-13 MED ORDER — EMPAGLIFLOZIN 25 MG PO TABS: 25.0000 mg | ORAL_TABLET | Freq: Every day | ORAL | 3 refills | Status: DC

## 2016-07-13 NOTE — Assessment & Plan Note (Addendum)
Doing well. No exertional symptoms. Rare random dyspnea that he describes as holding his breath. He will continue to monitor this. If he develops any exertional symptoms he'll be evaluated. He has good exercise tolerance with no issues. He'll continue Brilinta and aspirin. He will set up follow-up with his cardiologist.

## 2016-07-13 NOTE — Assessment & Plan Note (Addendum)
Blood pressure above goal of 130/80. Discussed that his carvedilol dose should be 6.25 mg twice daily. Sounds as though he has been taking half of that for some time. He will discuss this with his wife. They will let us know if he is taking 6.25 already. Continue his other medications.

## 2016-07-13 NOTE — Progress Notes (Signed)
Pre visit review using our clinic review tool, if applicable. No additional management support is needed unless otherwise documented below in the visit note. 

## 2016-07-13 NOTE — Telephone Encounter (Signed)
-----   Message from Glori LuisEric G Sonnenberg, MD sent at 07/13/2016 11:37 AM EST ----- Please contact the patient and let him know that his A1c is minimally improved at 7.6. This is not under good control. I would like to increase his Jardiance to 25 mg. Please confirm with him and his wife that he is taking Jardiance 10 mg at this time. His white blood cell count is minimally elevated and this is nonspecific. His other lab work is acceptable. Thanks.

## 2016-07-13 NOTE — Progress Notes (Signed)
  Tommi Rumps, MD Phone: [phone]  Michael Jordan is a 60 y.o. male who presents today for f/u.  Diabetes: Does not check his blood sugars. Some polyuria, no polydipsia. No hypoglycemia. Taking Jardiance and metformin. Saw ophthalmology 2 days ago. Stopped drinking soda. 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 Does exercise by walking some.  Hypertension: Does check though he does not know the numbers. His wife checks. Takes Coreg, lisinopril, and spironolactone. No chest pain. No edema. Notes occasionally feeling short of breath just sitting there though has no exertional dyspnea. Notes this rarely occurs. Resolves quickly. Feels as though he may be holding his breath at times. 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  CAD: Take Brilinta and aspirin. Has not followed up with cardiology recently. Is able to exercise with little difficulty. Has no issues loading and unloading trucks. No longer smoking.  Patient does note irritation around his rectum. Occurs when he eats too much acidic food. He wipes excessively and sweats a lot. Uses topical ointment his wife brings him from work. Notes occasionally bleeds when he wipes though has no blood in his stool.  PMH: Former smoker ROS see history of present illness  Objective  Physical Exam Vitals:   07/13/16 0800  BP: 138/80  Pulse: 77  Temp: 97.8 F (36.6 C)    BP Readings from Last 3 Encounters:  07/13/16 138/80  04/12/16 130/74  01/11/16 (!) 140/100   Wt Readings from Last 3 Encounters:  07/13/16 (!) 306 lb 12.8 oz (139.2 kg)  04/12/16 (!) 309 lb 6.4 oz (140.3 kg)  01/11/16 299 lb 1.9 oz (135.7 kg)    Physical Exam  Constitutional: He is well-developed, well-nourished, and in no distress.  HENT:  Head: Normocephalic and atraumatic.  Cardiovascular: Normal rate, regular rhythm and normal heart sounds.   Pulmonary/Chest: Effort normal and breath sounds normal.  Genitourinary:    Genitourinary Comments: Excoriated and irritated skin in the gluteal cleft and surrounding the rectum, no rectal lesions noted  Musculoskeletal: He exhibits no edema.  Neurological: He is alert. Gait normal.  Skin: Skin is warm and dry.     Assessment/Plan: Please see individual problem list.  CAD (coronary artery disease) Doing well. No exertional symptoms. Rare random dyspnea that he describes as holding his breath. He will continue to monitor this. If he develops any exertional symptoms he'll be evaluated. He has good exercise tolerance with no issues. He'll continue Brilinta and aspirin. He will set up follow-up with his cardiologist.  Essential hypertension Blood pressure above goal of 130/80. Discussed that his carvedilol dose should be 6.25 mg twice daily. Sounds as though he has been taking half of that for some time. He will discuss this with his wife. They will let us know if he is taking 6.25 already. Continue his other medications.  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 A1c today. Continue current medications.  Rectal irritation Patient with excoriated skin around his rectum. Patient declined rectal exam today. Discussed continuing topical medication for this. We will provide the patient stool cards to complete. CBC today. He declines colonoscopy referral as well.   Orders Placed This Encounter  Procedures  . HgB A1c  . Comp Met (CMET)  . CBC    Tommi Rumps, MD [place]

## 2016-07-13 NOTE — Patient Instructions (Addendum)
Nice to see you. We will get some lab work today and call you with the results. Your carvedilol dose should be 6.25 mg twice daily. You should continue your other medications. We will have you complete stool cards. Please continue to use the topical ointment on her buttocks. Please set up follow-up with Dr. Kirke CorinArida. If you develop chest pain, shortness of breath, or any new or changing symptoms please seek medical attention medially.

## 2016-07-13 NOTE — Assessment & Plan Note (Signed)
Not checking sugars. A1c today. Continue current medications.

## 2016-07-13 NOTE — Telephone Encounter (Signed)
patients wife confirmed that he is taking jardiance 10mg  daily. I have sent in rx for 25 mg.

## 2016-07-13 NOTE — Assessment & Plan Note (Signed)
Patient with excoriated skin around his rectum. Patient declined rectal exam today. Discussed continuing topical medication for this. We will provide the patient stool cards to complete. CBC today. He declines colonoscopy referral as well.

## 2016-07-14 ENCOUNTER — Ambulatory Visit: Payer: 59 | Admitting: Family Medicine

## 2016-07-14 NOTE — Telephone Encounter (Signed)
I agree. Thanks for sending this in.

## 2016-08-01 ENCOUNTER — Other Ambulatory Visit: Payer: Self-pay | Admitting: Cardiovascular Disease

## 2016-09-06 ENCOUNTER — Other Ambulatory Visit: Payer: Self-pay | Admitting: Cardiovascular Disease

## 2016-09-23 ENCOUNTER — Other Ambulatory Visit: Payer: Self-pay | Admitting: Cardiovascular Disease

## 2016-10-05 ENCOUNTER — Other Ambulatory Visit: Payer: Self-pay | Admitting: Family Medicine

## 2016-10-06 ENCOUNTER — Ambulatory Visit (INDEPENDENT_AMBULATORY_CARE_PROVIDER_SITE_OTHER): Payer: Self-pay | Admitting: Physician Assistant

## 2016-10-06 VITALS — BP 122/72 | HR 62 | Temp 97.4°F | Resp 18 | Ht 70.0 in | Wt 293.4 lb

## 2016-10-06 DIAGNOSIS — Z024 Encounter for examination for driving license: Secondary | ICD-10-CM

## 2016-10-06 NOTE — Progress Notes (Signed)
This patient presents for DOT examination for fitness for duty.   Medical History:  1. Head/Brain Injuries, disorders or illnesses   no  2. Seizures, epilepsy no  3. Eye disorders or impaired vision (except corrective lenses) no  4. Ear disorders, loss of hearing or balance no  5. Heart disease or heart attack, other cardiovascular condition yes  6. Heart surgery (valve replacement/bypass, angioplasty, pacemaker/defribrillator) yes  7. High blood pressure yes  8. High cholesterol yes  9. Chronic cough, shortness of breath or other breathing problems no  10. Lung disease (emphysema, asthma or chronic bronchitis) no  11. Kidney disease, dialysis no  12. Digestive problems  no  13. Diabetes or elevated blood sugar yes                      If yes to #13, Insulin use no  14. Nervious or psychiatric disorders, e.g., severe depression no  15. Fainting or syncope no  16. Dizziness, headaches, numbness, tingling or memory loss no  17. Unexplained weight loss no  18. Stroke, TIA or paralysis no  19. Missing or impaired hand, arm, foot, leg, finger, toe no  20. Spinal injury or disease no  21. Bone, muscles or nerve problems no  22. Blood clots or bleeding bleeding disorders no  23. Cancer no  24. Chronic infection or other chronic diseases no  25. Sleep disorders, pauses in breathing while asleep, daytime sleepiness, loud snoring no  26. Have you ever had a sleep test? no  27.  Have you ever spent a night in the hospital? yes  28. Have you ever had a broken bone? no  29. Have you or or do you use tobacco products? Yes- past  30. Regular, frequent alcohol use no  31. Illegal substance use within the past 2 years no  32.  Have you ever failed a drug test or been dependent on an illegal substance? no   Current Medications: Prior to Admission medications   Medication Sig Start Date End Date Taking? Authorizing Provider  aspirin 81 MG chewable tablet Chew 1 tablet (81 mg total) by  mouth daily. 10/02/15  Yes Antonieta Ibaimothy J Gollan, MD  atorvastatin (LIPITOR) 80 MG tablet TAKE 1 TABLET (80 MG TOTAL) BY MOUTH DAILY AT 6 PM. 09/06/16  Yes Iran OuchMuhammad A Arida, MD  BRILINTA 90 MG TABS tablet TAKE 1 TABLET (90 MG TOTAL) BY MOUTH 2 (TWO) TIMES DAILY. 09/06/16  Yes Iran OuchMuhammad A Arida, MD  carvedilol (COREG) 3.125 MG tablet TAKE 1 TABLET (3.125 MG TOTAL) BY MOUTH 2 (TWO) TIMES DAILY WITH A MEAL. 09/25/16  Yes Iran OuchMuhammad A Arida, MD  carvedilol (COREG) 6.25 MG tablet TAKE 1 TABLET (6.25 MG TOTAL) BY MOUTH 2 (TWO) TIMES DAILY WITH A MEAL. 08/01/16  Yes Iran OuchMuhammad A Arida, MD  empagliflozin (JARDIANCE) 25 MG TABS tablet Take 25 mg by mouth daily. 07/13/16  Yes Glori LuisEric G Sonnenberg, MD  lisinopril (PRINIVIL,ZESTRIL) 5 MG tablet TAKE 1 TABLET (5 MG TOTAL) BY MOUTH DAILY. 09/06/16  Yes Iran OuchMuhammad A Arida, MD  metFORMIN (GLUCOPHAGE) 1000 MG tablet TAKE 1 TABLET (1,000 MG TOTAL) BY MOUTH 2 (TWO) TIMES DAILY WITH A MEAL. 10/05/16  Yes Glori LuisEric G Sonnenberg, MD  nitroGLYCERIN (NITROSTAT) 0.4 MG SL tablet Place 1 tablet (0.4 mg total) under the tongue every 5 (five) minutes as needed for chest pain. 10/02/15  Yes Antonieta Ibaimothy J Gollan, MD  spironolactone (ALDACTONE) 25 MG tablet Take 0.5 tablets (12.5 mg total)  by mouth daily. 12/13/15  Yes Iran Ouch, MD  ticagrelor (BRILINTA) 90 MG TABS tablet Take 1 tablet (90 mg total) by mouth 2 (two) times daily. 10/02/15  Yes Antonieta Iba, MD    Medical Examiner's Comments on Health History:  09/2015 - anterior ST elevation MI  09/30/2015 - Echo - EF% 35-40 -- Per cardiologist - LAD stent placed  11/01/2015 - nuclear stress test - ST depression - lead to repeat Heart cath which showed patent stent in LAD - EF 46%  11/11/2015 heart cath  10/2015 - heart cath - Mid RCA lesion, 100% stenosed.  Prox RCA lesion, 80% stenosed.  Ost 1st Diag to 1st Diag lesion, 40% stenosed.  Ost 1st Sept lesion, 40% stenosed.  Mid Cx lesion, 40% stenosed. There is mild left ventricular systolic  dysfunction. -- per cardiologist ok to resume work from a cardiac standpoint  12/2015 - last cardiology appt - repeat exam 6 months  07/13/2016 - 7.6 A1C  Needs to make the cardiologist appt --   TESTING:  Vision Screening Comments: 85 degrees peripheral  Monocular Vision: Yes.    Hearing Aid used for test: No. Hearing Aid required to to meet standard: No.  BP (!) 145/77 (BP Location: Right Arm, Patient Position: Sitting, Cuff Size: Large)   Pulse 62   Temp 97.4 F (36.3 C) (Oral)   Resp 18   Ht 5\' 10"  (1.778 m)   Wt 293 lb 6.4 oz (133.1 kg)   SpO2 96%   BMI 42.10 kg/m  Pulse rate is regular, irregular     PHYSICAL EXAMINATION:  General Appearance Not markedly obese. No tremor, signs of alcoholism, problem drinking or drug abuse.   Skin Warm, dry and intact.   Eyes Pupils are equal, round and reactive to light and accommodation, extraocular movements are intact. No exophthalmos, no nystagmus. Evidence of cataracts, retinopathy, macular degeneration, aphakia, glaucoma may need referral to a specialist.  Ears Normal external ears. External canal without occlusion. No scarring of the TM. No perforation of the TM.  Mouth and Throat Clear and moist. No irremedial deformities likely to interfere with breathing or swallowing.  Heart No murmurs, extra sounds, evidence of cardiomegaly. No pacemaker. No implantable defibrillator.  Lungs and Chest (excluding breasts) Normal chest expansion, respiratory rate, breath sounds. No cyanosis.  Abdomen and Viscera No liver enlargement. No splenic enlargement. No masses, bruits, hernias or significant abdominal wall weakness.  Genitourinary  No inguinal or femoral hernia.  Spine and other musculoskeletal No tenderness, no limitation of motion, no deformities. no evidence of previous surgery.  Extremities No loss or impairment of leg, foot, toe, arm, hand, finger. No perceptible limp, deformities, atrophy, weakness, paralysis, clubbing, edema,  hypotonia. Patient has sufficient grasp and prehension to maintain steering wheel grip. Patient has sufficient mobility and strength in the lower limbs to operate pedals properly.  Neurologic Normal equilibrium, coordination, speech pattern. No paresthesia, asymmetry of deep tendon reflexes, sensory or positional abnormalities. No abnormality of patellar or Babinski's reflexes.  Gait Not antalgic or ataxic  Vascular Normal pulses. No carotid or arterial bruits. No varicose veins.    Does not meet standards. Certification Status: does not meet standards for 2 year certificate. Meets standards, but periodic monitoring required due to: HTN, h/o MI, DM  Driver qualified only for: 1 year   Wearing corrective lenses: no Wearing hearing aid: no Accompanied by a no waiver/exemption Skill performance Evaluation (SPE) Certificate: no Driving within an exempt intracity zone: no Qualified  by operation of 49 CFR 391.64: no  Certification expires 10/06/2017

## 2016-10-06 NOTE — Patient Instructions (Signed)
     IF you received an x-ray today, you will receive an invoice from [facility]. Please contact [facility] at [phone] with questions or concerns regarding your invoice.   IF you received labwork today, you will receive an invoice from [facility]. Please contact [facility] at [phone] with questions or concerns regarding your invoice.   Our billing staff will not be able to assist you with questions regarding bills from these companies.  You will be contacted with the lab results as soon as they are available. The fastest way to get your results is to activate your My Chart account. Instructions are located on the last page of this paperwork. If you have not heard from us regarding the results in 2 weeks, please contact this office.     

## 2016-10-11 ENCOUNTER — Ambulatory Visit (INDEPENDENT_AMBULATORY_CARE_PROVIDER_SITE_OTHER): Payer: BLUE CROSS/BLUE SHIELD | Admitting: Family Medicine

## 2016-10-11 ENCOUNTER — Encounter: Payer: Self-pay | Admitting: Family Medicine

## 2016-10-11 VITALS — BP 130/72 | HR 66 | Temp 97.7°F | Resp 12 | Ht 71.0 in | Wt 294.0 lb

## 2016-10-11 DIAGNOSIS — I10 Essential (primary) hypertension: Secondary | ICD-10-CM

## 2016-10-11 DIAGNOSIS — E1159 Type 2 diabetes mellitus with other circulatory complications: Secondary | ICD-10-CM | POA: Diagnosis not present

## 2016-10-11 DIAGNOSIS — E1165 Type 2 diabetes mellitus with hyperglycemia: Secondary | ICD-10-CM | POA: Diagnosis not present

## 2016-10-11 DIAGNOSIS — IMO0002 Reserved for concepts with insufficient information to code with codable children: Secondary | ICD-10-CM

## 2016-10-11 DIAGNOSIS — I252 Old myocardial infarction: Secondary | ICD-10-CM | POA: Diagnosis not present

## 2016-10-11 DIAGNOSIS — E785 Hyperlipidemia, unspecified: Secondary | ICD-10-CM | POA: Diagnosis not present

## 2016-10-11 LAB — BASIC METABOLIC PANEL
BUN: 15 mg/dL (ref 6–23)
CO2: 25 mEq/L (ref 19–32)
Calcium: 9.4 mg/dL (ref 8.4–10.5)
Chloride: 105 mEq/L (ref 96–112)
Creatinine, Ser: 0.92 mg/dL (ref 0.40–1.50)
GFR: 88.63 mL/min (ref >60.00)
Glucose, Bld: 145 mg/dL — ABNORMAL HIGH (ref 70–99)
Potassium: 4.3 mEq/L (ref 3.5–5.1)
Sodium: 137 mEq/L (ref 135–145)

## 2016-10-11 LAB — HEMOGLOBIN A1C: Hgb A1c MFr Bld: 7.6 % — ABNORMAL HIGH (ref 4.6–6.5)

## 2016-10-11 NOTE — Assessment & Plan Note (Addendum)
Not checking sugars. A1c today. Continue current medications. 

## 2016-10-11 NOTE — Assessment & Plan Note (Signed)
Patient reports no chest pain or shortness of breath. He'll continue his current medications and continue to exercise.

## 2016-10-11 NOTE — Assessment & Plan Note (Signed)
At goal on recheck. Continue current medications. 

## 2016-10-11 NOTE — Assessment & Plan Note (Signed)
Tolerating Lipitor. Continue Lipitor. 

## 2016-10-11 NOTE — Progress Notes (Signed)
  Michael AlarEric Nygel Prokop, MD Phone: [phone]  Michael Jordan is a 61 y.o. male who presents today for follow-up.  History of STEMI: Patient notes he is doing very well. He is taking Brilinta, carvedilol, lisinopril, and aspirin. He notes no chest pain, shortness of breath, or edema. He started exercising and is feeling a lot better with this.  HYPERLIPIDEMIA Symptoms Chest pain on exertion:  No   Leg claudication:   no Medications: Compliance- taking lipitor Right upper quadrant pain- no  Muscle aches- no  DIABETES Disease Monitoring: Blood Sugar ranges-not checking Polyuria/phagia/dipsia- no     Vision changes- not Medications: Compliance- taking jardiance, metformin Hypoglycemic symptoms- no    PMH: former smoker   ROS see history of present illness  Objective  Physical Exam Vitals:   10/11/16 0837 10/11/16 0904  BP: (!) 144/84 130/72  Pulse: 66   Resp: 12   Temp: 97.7 F (36.5 C)     BP Readings from Last 3 Encounters:  10/11/16 130/72  10/06/16 122/72  07/13/16 138/80   Wt Readings from Last 3 Encounters:  10/11/16 294 lb (133.4 kg)  10/06/16 293 lb 6.4 oz (133.1 kg)  07/13/16 (!) 306 lb 12.8 oz (139.2 kg)    Physical Exam  Constitutional: No distress.  Cardiovascular: Normal rate, regular rhythm and normal heart sounds.   Pulmonary/Chest: Effort normal and breath sounds normal.  Musculoskeletal: He exhibits no edema.  Neurological: He is alert. Gait normal.  Skin: Skin is warm and dry. He is not diaphoretic.     Assessment/Plan: Please see individual problem list.  History of ST elevation myocardial infarction (STEMI) Patient reports no chest pain or shortness of breath. He'll continue his current medications and continue to exercise.  Hyperlipidemia Tolerating Lipitor. Continue Lipitor.  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 A1c today. Continue current medications.  Essential hypertension At  goal on recheck. Continue current medications.   Orders Placed This Encounter  Procedures  . Basic Metabolic Panel (BMET)  . HgB A1c    Michael AlarEric Sherif Millspaugh, MD [facility] [place] [place]

## 2016-10-11 NOTE — Patient Instructions (Signed)
Nice to see you. We are going to check some lab work today and contact you with the results. Please continue to work on your diet and exercise.

## 2016-10-11 NOTE — Progress Notes (Signed)
Pre-visit discussion using our clinic review tool. No additional management support is needed unless otherwise documented below in the visit note.  

## 2016-10-12 ENCOUNTER — Telehealth: Payer: Self-pay

## 2016-10-12 NOTE — Telephone Encounter (Signed)
-----   Message from Glori LuisEric G Sonnenberg, MD sent at 10/11/2016  7:03 PM EDT ----- Please let the patient know that his A1c is unchanged from previously. I would like to add an additional diabetes medicine to help get this to his goal of less than 7. If he is willing I can send this to his pharmacy. Thanks.

## 2016-10-12 NOTE — Telephone Encounter (Signed)
Left message to return call 

## 2016-10-13 NOTE — Telephone Encounter (Signed)
Left message to return call 

## 2016-10-13 NOTE — Telephone Encounter (Signed)
Patient notified and is ok with adding an additional oral med

## 2016-10-15 MED ORDER — SITAGLIPTIN PHOSPHATE 100 MG PO TABS: 100.0000 mg | ORAL_TABLET | Freq: Every day | ORAL | 2 refills | Status: DC

## 2016-10-15 NOTE — Telephone Encounter (Signed)
Sent to pharmacy 

## 2016-10-16 IMAGING — CR DG CHEST 2V
1 series · 3 of 3 positions shown · non-contrast
Comparison: 08/06/2007

CLINICAL DATA: Chest pain at cardiac rehab. Jaw tightness, arm
tingling.

EXAM:
CHEST  2 VIEW

[Series 1: dg chest 2 view · 0.14mm/px · 3 of 3 slices shown]
[im 1/3]
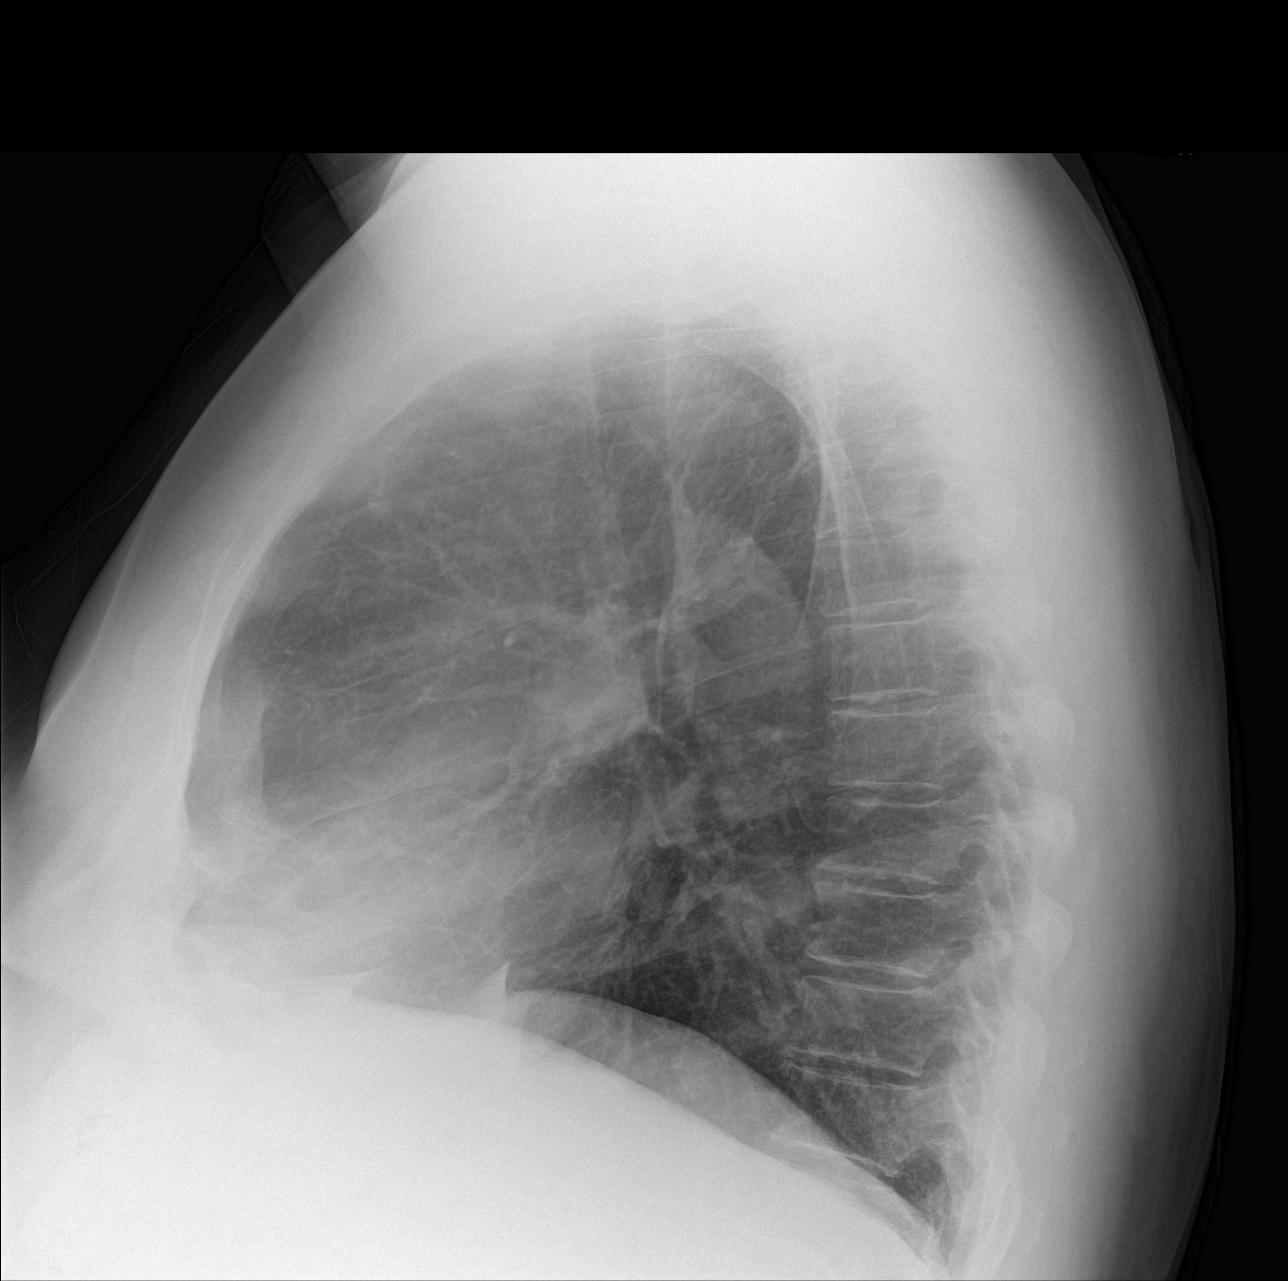
[im 2/3]
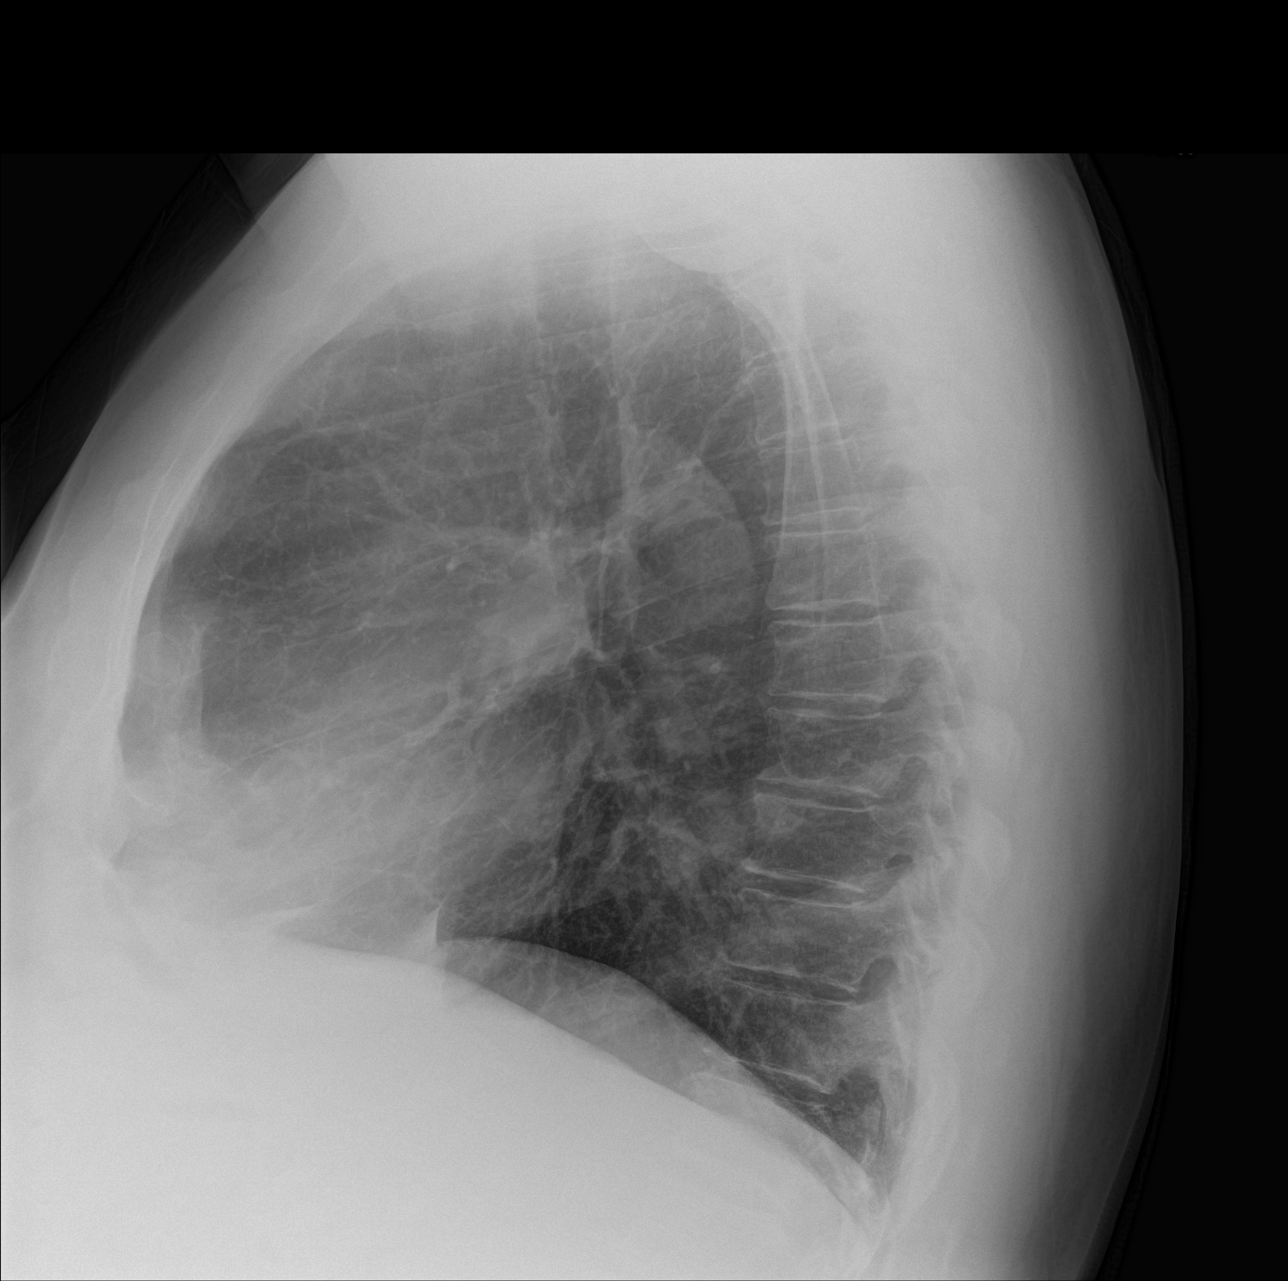
[im 3/3]
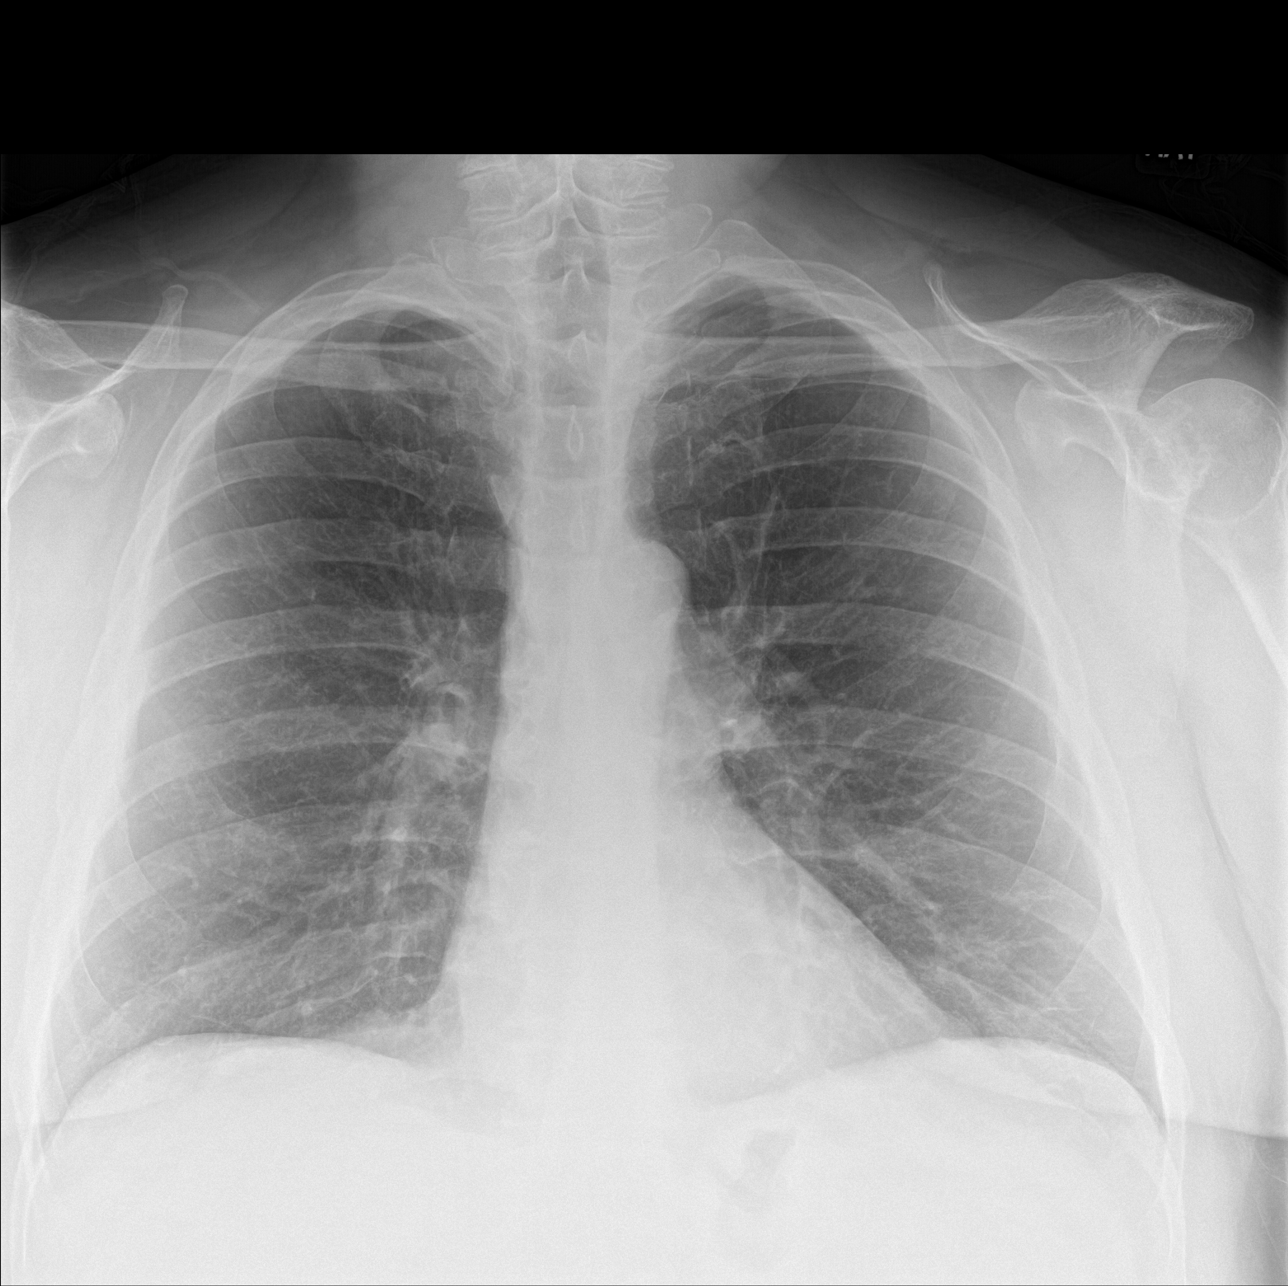

[3 of 3 positions shown; findings below may reference images not displayed]

FINDINGS: Heart and mediastinal contours are within normal limits. No focal
opacities or effusions. No acute bony abnormality.
IMPRESSION: No active cardiopulmonary disease.

## 2016-10-31 ENCOUNTER — Other Ambulatory Visit: Payer: Self-pay | Admitting: Cardiovascular Disease

## 2016-11-01 ENCOUNTER — Other Ambulatory Visit: Payer: Self-pay

## 2016-11-01 ENCOUNTER — Telehealth: Payer: Self-pay | Admitting: Cardiovascular Disease

## 2016-11-01 NOTE — Telephone Encounter (Signed)
Patient would like refill on Coreg, Dr.Arida's nurse states he will need a follow up scheduled in order to get refills since he was last seen in June 2017. Patient is unable to commit to appointment with heartcare due to starting a new job and would like for you to refill this. Please advise

## 2016-11-01 NOTE — Telephone Encounter (Signed)
Pt aware he is past due for 6 month f/u w/Dr. Kirke Corin. Requesting coreg refills. Pt was given a refill in January and March with the understanding to make appt . Offered 4/16 appt but he reports starting a new job that day. He is unsure when he will be able to make an appt. He asks if I will contact Dr. Birdie Sons for him to request refills as he saw MD March 21. S/w Shanda Bumps at [facility] who acknowledged the request.

## 2016-11-01 NOTE — Telephone Encounter (Signed)
Received coreg 3.125mg  BID refill request. Pt is past due for 6 month f/u. Two different coreg doses listed in medication list. Unsure which dose pt is taking. Left message on VM for patient to contact the office.

## 2016-11-01 NOTE — Telephone Encounter (Signed)
Will you please advise if okay to refill. Patient was last seen 12/2015 and never followed back up for his 6 month follow up. Patient also had two Carvedilol's on med list both with different doses.

## 2016-11-02 MED ORDER — CARVEDILOL 6.25 MG PO TABS: 6.2500 mg | ORAL_TABLET | Freq: Two times a day (BID) | ORAL | 3 refills | Status: DC

## 2016-11-02 NOTE — Telephone Encounter (Signed)
Left message to notify patient.

## 2016-11-02 NOTE — Telephone Encounter (Signed)
Sent to pharmacy for refills. I would suggest keeping follow-up with cardiology in the future once he knows when he can schedule an appointment.

## 2016-11-04 ENCOUNTER — Other Ambulatory Visit: Payer: Self-pay | Admitting: Family Medicine

## 2016-11-06 ENCOUNTER — Ambulatory Visit: Payer: BLUE CROSS/BLUE SHIELD | Admitting: Cardiovascular Disease

## 2016-11-28 ENCOUNTER — Other Ambulatory Visit: Payer: Self-pay | Admitting: Cardiovascular Disease

## 2016-11-30 ENCOUNTER — Encounter: Payer: Self-pay | Admitting: Cardiovascular Disease

## 2016-11-30 ENCOUNTER — Ambulatory Visit (INDEPENDENT_AMBULATORY_CARE_PROVIDER_SITE_OTHER): Payer: BLUE CROSS/BLUE SHIELD | Admitting: Cardiovascular Disease

## 2016-11-30 VITALS — BP 130/80 | HR 69 | Ht 71.0 in | Wt 282.5 lb

## 2016-11-30 DIAGNOSIS — E785 Hyperlipidemia, unspecified: Secondary | ICD-10-CM | POA: Diagnosis not present

## 2016-11-30 DIAGNOSIS — I2510 Atherosclerotic heart disease of native coronary artery without angina pectoris: Secondary | ICD-10-CM

## 2016-11-30 DIAGNOSIS — I255 Ischemic cardiomyopathy: Secondary | ICD-10-CM | POA: Diagnosis not present

## 2016-11-30 DIAGNOSIS — I10 Essential (primary) hypertension: Secondary | ICD-10-CM

## 2016-11-30 MED ORDER — TICAGRELOR 60 MG PO TABS: 60.0000 mg | ORAL_TABLET | Freq: Two times a day (BID) | ORAL | 5 refills | Status: DC

## 2016-11-30 MED ORDER — TICAGRELOR 60 MG PO TABS
60.0000 mg | ORAL_TABLET | Freq: Two times a day (BID) | ORAL | 2 refills | Status: DC
Start: 2016-11-30 — End: 2017-08-20

## 2016-11-30 NOTE — Progress Notes (Signed)
Cardiology Office Note   Date:  11/30/2016   ID:  Michael Jordan, DOB [birth year], MRN [id number]  PCP:  [name], MD  Cardiologist:   Lorine Bears, MD   Chief Complaint  Patient presents with  . other    6 month follow up. Meds reviewed by the pt. verbally. "doing well."       History of Present Illness:  Michael Jordan is a 61 y.o. male who presents for a follow-up visit regarding coronary artery disease.  He was hospitalized in March 2017 with  anterior ST elevation myocardial infarction. Cardiac catheterization showed occluded proximal LAD with mild disease affecting the diagonals, chronically occluded right coronary artery with left to right collaterals and moderate left circumflex disease. He underwent successful angioplasty and drug-eluting stent placement in the proximal LAD. Ejection fraction was 40-45% by LV gram and 35-40% by echocardiogram.  He has other chronic medical conditions that include morbid obesity, hypertension, hyperlipidemia and type 2 diabetes. He quit Smoking in November 2016 Most recent ejection fraction was 45%. He has been doing very well and denies any chest pain, shortness of breath or palpitations. He has been going to the gym regularly smoking in November 2016. He lost about 30 pounds since his myocardial infarction. He is compliant with his medications.   Past Medical History:  Diagnosis Date  . CAD (coronary artery disease)    a. 09/2015 Ant STEMI/PCI: LM nl, LAD 100p (3.0x18 Xience Alpine DES), D1 40, SP1 40ost, D3 min irregs, LCX 53m, OM1 nl, OM3 min irregs, RCA 80p/125m (CTO)->fills via collats, EF 45-50%.  . Cardiomyopathy, ischemic    a. 09/2015 Echo: EF 35-40%, sev apical, periapical, antapical HK, mild to mod ant, inf HK, Gr1 DD.  . Diabetes (HCC)   . Hyperlipidemia   . Morbid obesity (HCC)   . Tobacco abuse    a. Quit 05/2015.    Past Surgical History:  Procedure Laterality Date  . CARDIAC CATHETERIZATION N/A 09/30/2015    Procedure: Left Heart Cath and Coronary Angiography;  Surgeon: Iran Ouch, MD;  Location: [facility] INVASIVE CV [facility];  Service: Cardiovascular;  Laterality: N/A;  . CARDIAC CATHETERIZATION N/A 09/30/2015   Procedure: Coronary Stent Intervention;  Surgeon: Iran Ouch, MD;  Location: [facility] INVASIVE CV LAB;  Service: Cardiovascular;  Laterality: N/A;  . CARDIAC CATHETERIZATION N/A 11/11/2015   Procedure: Left Heart Cath and Coronary Angiography;  Surgeon: Iran Ouch, MD;  Location: [facility] INVASIVE CV LAB;  Service: Cardiovascular;  Laterality: N/A;  . TONSILLECTOMY       Current Outpatient Prescriptions  Medication Sig Dispense Refill  . aspirin 81 MG chewable tablet Chew 1 tablet (81 mg total) by mouth daily.    Marland Kitchen atorvastatin (LIPITOR) 80 MG tablet TAKE 1 TABLET (80 MG TOTAL) BY MOUTH DAILY AT 6 PM. 30 tablet 3  . carvedilol (COREG) 6.25 MG tablet Take 1 tablet (6.25 mg total) by mouth 2 (two) times daily with a meal. 60 tablet 3  . JARDIANCE 25 MG TABS tablet TAKE 1 TABLET BY MOUTH EVERY DAY 30 tablet 3  . lisinopril (PRINIVIL,ZESTRIL) 5 MG tablet TAKE 1 TABLET (5 MG TOTAL) BY MOUTH DAILY. 30 tablet 3  . metFORMIN (GLUCOPHAGE) 1000 MG tablet TAKE 1 TABLET (1,000 MG TOTAL) BY MOUTH 2 (TWO) TIMES DAILY WITH A MEAL. 180 tablet 3  . nitroGLYCERIN (NITROSTAT) 0.4 MG SL tablet Place 1 tablet (0.4 mg total) under the tongue every 5 (five) minutes as needed for  chest pain. 25 tablet 3  . sitaGLIPtin (JANUVIA) 100 MG tablet Take 1 tablet (100 mg total) by mouth daily. 90 tablet 2  . spironolactone (ALDACTONE) 25 MG tablet Take 0.5 tablets (12.5 mg total) by mouth daily. 30 tablet 6  . ticagrelor (BRILINTA) 60 MG TABS tablet Take 1 tablet (60 mg total) by mouth 2 (two) times daily. 180 tablet 2   No current facility-administered medications for this visit.     Allergies:   Patient has no known allergies.    Social History:  The patient  reports that he quit smoking about 17 months ago. His  smoking use included Cigarettes. He has a 22.00 pack-year smoking history. He has never used smokeless tobacco. He reports that he drinks alcohol. He reports that he does not use drugs.   Family History:  The patient's family history includes Diabetes in his mother; Heart attack in his father, mother, and paternal grandfather.    ROS:  Please see the history of present illness.   Otherwise, review of systems are positive for none.   All other systems are reviewed and negative.    PHYSICAL EXAM: VS:  BP 130/80 (BP Location: Left Arm, Patient Position: Sitting, Cuff Size: Large)   Pulse 69   Ht 5\' 11"  (1.803 m)   Wt 282 lb 8 oz (128.1 kg)   BMI 39.40 kg/m  , BMI Body mass index is 39.4 kg/m. GEN: Well nourished, well developed, in no acute distress  HEENT: normal  Neck: no JVD, carotid bruits, or masses Cardiac: RRR; no murmurs, rubs, or gallops,no edema  Respiratory:  clear to auscultation bilaterally, normal work of breathing GI: soft, nontender, nondistended, + BS MS: no deformity or atrophy  Skin: warm and dry, no rash Neuro:  Strength and sensation are intact Psych: euthymic mood, full affect   EKG:  EKG is ordered today. EKG showed normal sinus rhythm with no significant ST or T wave changes.   Recent Labs: 07/13/2016: ALT 21; Hemoglobin 14.5; Platelets 278.0 10/11/2016: BUN 15; Creatinine, Ser 0.92; Potassium 4.3; Sodium 137    Lipid Panel    Component Value Date/Time   CHOL 118 11/01/2015 0911   TRIG 115 11/01/2015 0911   HDL 37 (L) 11/01/2015 0911   CHOLHDL 3.2 11/01/2015 0911   VLDL 23 11/01/2015 0911   LDLCALC 58 11/01/2015 0911      Wt Readings from Last 3 Encounters:  11/30/16 282 lb 8 oz (128.1 kg)  10/11/16 294 lb (133.4 kg)  10/06/16 293 lb 6.4 oz (133.1 kg)        ASSESSMENT AND PLAN:  1.  Coronary artery disease involving native coronary arteries without angina: He is doing very well with no anginal symptoms.  I decreased Brilinta to 60 mg  twice daily.  2. Hyperlipidemia: Lipid profile showed significant improvement with high dose atorvastatin with an LDL of 58. I reviewed most recent labs which showed normal liver function.  3. Ischemic cardiomyopathy: Most recent ejection fraction was 45%. Continue carvedilol, lisinopril and spironolactone. Most recent labs showed normal electrolytes and renal function.   4. Essential hypertension: Blood pressure is controlled on current medications.    Disposition:   FU with me in 6 months  Signed,  Lorine BearsMuhammad Arida, MD  11/30/2016 8:24 AM    [facility]

## 2016-11-30 NOTE — Patient Instructions (Signed)
Medication Instructions:  Your physician has recommended you make the following change in your medication:  Make sure your coreg dose is 6.25mg  twice daily DECREASE Brilinta to 60mg  twice daily   Labwork: none  Testing/Procedures: None   Follow-Up: Your physician wants you to follow-up in: 6 months with Dr. Kirke CorinArida.  You will receive a reminder letter in the mail two months in advance. If you don't receive a letter, please call our office to schedule the follow-up appointment.   Any Other Special Instructions Will Be Listed Below (If Applicable).     If you need a refill on your cardiac medications before your next appointment, please call your pharmacy.

## 2016-12-30 ENCOUNTER — Other Ambulatory Visit: Payer: Self-pay | Admitting: Cardiovascular Disease

## 2016-12-31 ENCOUNTER — Other Ambulatory Visit: Payer: Self-pay | Admitting: Cardiovascular Disease

## 2017-01-04 ENCOUNTER — Other Ambulatory Visit: Payer: Self-pay | Admitting: Cardiovascular Disease

## 2017-01-04 NOTE — Telephone Encounter (Signed)
Patient is requesting Brilinta 90MG  Per last office it looks like Dr. Kirke CorinArida DECREASED Brilinta 60MG .

## 2017-01-08 ENCOUNTER — Telehealth: Payer: Self-pay | Admitting: Family Medicine

## 2017-01-08 NOTE — Telephone Encounter (Signed)
Patient is not wanting to do the  3 months due to cost. He prefer every 6 months and is losing weight and wants to just keep medications within that time frame. Patient would like to be schedule every 6 months

## 2017-01-08 NOTE — Telephone Encounter (Signed)
Pt called stating he does not feel like he needs to come in every 3 months unless it will affect him getting his medication. Please advise?  Call pt @ [phone]. Thank you!

## 2017-01-08 NOTE — Telephone Encounter (Signed)
LMOM for patient to call back.

## 2017-01-08 NOTE — Telephone Encounter (Signed)
Please advise 

## 2017-01-08 NOTE — Telephone Encounter (Signed)
I would recommend being seen every 3 months for now as we are trying to get his diabetes under control. It has not been at goal the past several times we have checked it and we have been adding medications to his regimen. If he does not want to be seen every 3 months he needs to at least come in every 6 months, though I would recommend 3 months given his uncontrolled diabetes. Thanks.

## 2017-01-09 NOTE — Telephone Encounter (Signed)
Noted. I would like him to come in at least for an A1c every 3 months until we get his diabetes under control. He can do this just through a lab appointment. Thanks.

## 2017-01-10 NOTE — Telephone Encounter (Signed)
LMOM for patient to call back.

## 2017-01-10 NOTE — Telephone Encounter (Signed)
Noted  

## 2017-01-10 NOTE — Telephone Encounter (Signed)
Patient state he refuses to be seen every three months because he feel fine. He has an appt set for September 2018  and will come that day and discuss his situation. States that he has lost a lot of weight and his wife is a Engineer, civil (consulting)nurse who take care of him and manages his consumption of food. He doesn't want to come in cause he has no complaints and will like to do only 6 months f/u with labs.... Patient is not being noncompliant but wants you to understand that he doesn't do sugar checks and is losing weight,so he wants to just keep it at 6 months

## 2017-01-11 ENCOUNTER — Ambulatory Visit: Payer: BLUE CROSS/BLUE SHIELD | Admitting: Family Medicine

## 2017-03-02 ENCOUNTER — Other Ambulatory Visit: Payer: Self-pay | Admitting: Family Medicine

## 2017-04-06 ENCOUNTER — Other Ambulatory Visit: Payer: Self-pay | Admitting: Family Medicine

## 2017-04-12 ENCOUNTER — Ambulatory Visit: Payer: BLUE CROSS/BLUE SHIELD | Admitting: Family Medicine

## 2017-05-05 ENCOUNTER — Other Ambulatory Visit: Payer: Self-pay | Admitting: Cardiovascular Disease

## 2017-05-28 ENCOUNTER — Encounter: Payer: Self-pay | Admitting: Family Medicine

## 2017-05-28 ENCOUNTER — Ambulatory Visit (INDEPENDENT_AMBULATORY_CARE_PROVIDER_SITE_OTHER): Payer: BLUE CROSS/BLUE SHIELD | Admitting: Family Medicine

## 2017-05-28 ENCOUNTER — Ambulatory Visit: Payer: BLUE CROSS/BLUE SHIELD | Admitting: Family Medicine

## 2017-05-28 VITALS — BP 130/84 | HR 78 | Temp 98.2°F | Wt 302.2 lb

## 2017-05-28 DIAGNOSIS — E1165 Type 2 diabetes mellitus with hyperglycemia: Secondary | ICD-10-CM

## 2017-05-28 DIAGNOSIS — I10 Essential (primary) hypertension: Secondary | ICD-10-CM

## 2017-05-28 DIAGNOSIS — I2510 Atherosclerotic heart disease of native coronary artery without angina pectoris: Secondary | ICD-10-CM

## 2017-05-28 DIAGNOSIS — IMO0002 Reserved for concepts with insufficient information to code with codable children: Secondary | ICD-10-CM

## 2017-05-28 DIAGNOSIS — E785 Hyperlipidemia, unspecified: Secondary | ICD-10-CM

## 2017-05-28 DIAGNOSIS — E1159 Type 2 diabetes mellitus with other circulatory complications: Secondary | ICD-10-CM | POA: Diagnosis not present

## 2017-05-28 NOTE — Assessment & Plan Note (Signed)
Return for fasting lipid panel 

## 2017-05-28 NOTE — Assessment & Plan Note (Signed)
Asymptomatic.  Continue current regimen. 

## 2017-05-28 NOTE — Progress Notes (Signed)
  Tommi Rumps, MD Phone: [phone]  Michael Jordan is a 61 y.o. male who presents today for f/u.  CAD: Denies chest pain, shortness of breath, or edema.  Taking spironolactone, lisinopril, carvedilol, aspirin.  Diabetes: Taking Jardiance, metformin, Januvia.  No polyuria or polydipsia.  No hypoglycemia.  He is trying to cut down on sugar intake.  He is drinking more water.  Hyperlipidemia: No myalgias or right upper quadrant pain.  Taking Lipitor.  PMH: former smoker   ROS see history of present illness  Objective  Physical Exam Vitals:   05/28/17 1035  BP: 130/84  Pulse: 78  Temp: 98.2 F (36.8 C)  SpO2: 95%    BP Readings from Last 3 Encounters:  05/28/17 130/84  11/30/16 130/80  10/11/16 130/72   Wt Readings from Last 3 Encounters:  05/28/17 (!) 302 lb 3.2 oz (137.1 kg)  11/30/16 282 lb 8 oz (128.1 kg)  10/11/16 294 lb (133.4 kg)    Physical Exam  Constitutional: No distress.  Cardiovascular: Normal rate, regular rhythm and normal heart sounds.  Pulmonary/Chest: Effort normal and breath sounds normal.  Musculoskeletal: He exhibits no edema.  Neurological: He is alert. Gait normal.  Skin: He is not diaphoretic.     Assessment/Plan: Please see individual problem list.  CAD (coronary artery disease) Asymptomatic.  Continue current regimen.  Essential hypertension Adequately controlled.  Continue current regimen.  Uncontrolled type 2 diabetes mellitus with circulatory disorder [facility]) Patient will return for fasting lab work and A1c at that time.  Hyperlipidemia Return for fasting lipid panel.   Orders Placed This Encounter  Procedures  . Lipid Profile    Standing Status:   Future    Standing Expiration Date:   05/28/2018  . Hemoglobin A1c    Standing Status:   Future    Standing Expiration Date:   05/28/2018  . Comp Met (CMET)    Standing Status:   Future    Standing Expiration Date:   05/28/2018  . Urine Microalbumin w/creat. ratio   Standing Status:   Future    Standing Expiration Date:   05/28/2018    Tommi Rumps, MD [place]

## 2017-05-28 NOTE — Assessment & Plan Note (Signed)
Adequately controlled.  Continue current regimen. 

## 2017-05-28 NOTE — Patient Instructions (Signed)
Nice to see you. We will have you return for fasting lab work. Please continue your current medication regimen.

## 2017-05-28 NOTE — Assessment & Plan Note (Signed)
Patient will return for fasting lab work and A1c at that time.

## 2017-06-04 ENCOUNTER — Other Ambulatory Visit (INDEPENDENT_AMBULATORY_CARE_PROVIDER_SITE_OTHER): Payer: BLUE CROSS/BLUE SHIELD

## 2017-06-04 DIAGNOSIS — E1165 Type 2 diabetes mellitus with hyperglycemia: Secondary | ICD-10-CM

## 2017-06-04 DIAGNOSIS — IMO0002 Reserved for concepts with insufficient information to code with codable children: Secondary | ICD-10-CM

## 2017-06-04 DIAGNOSIS — E1159 Type 2 diabetes mellitus with other circulatory complications: Secondary | ICD-10-CM

## 2017-06-04 LAB — COMPREHENSIVE METABOLIC PANEL
ALT: 18 U/L (ref 0–53)
AST: 31 U/L (ref 0–37)
Albumin: 4.1 g/dL (ref 3.5–5.2)
Alkaline Phosphatase: 73 U/L (ref 39–117)
BUN: 16 mg/dL (ref 6–23)
CO2: 25 mEq/L (ref 19–32)
Calcium: 9.2 mg/dL (ref 8.4–10.5)
Chloride: 102 mEq/L (ref 96–112)
Creatinine, Ser: 0.87 mg/dL (ref 0.40–1.50)
GFR: 94.33 mL/min (ref >60.00)
Glucose, Bld: 155 mg/dL — ABNORMAL HIGH (ref 70–99)
Potassium: 4.3 mEq/L (ref 3.5–5.1)
Sodium: 135 mEq/L (ref 135–145)
Total Bilirubin: 0.5 mg/dL (ref 0.2–1.2)
Total Protein: 7.1 g/dL (ref 6.0–8.3)

## 2017-06-04 LAB — MICROALBUMIN / CREATININE URINE RATIO
Creatinine,U: 108.4 mg/dL
Microalb Creat Ratio: 0.6 mg/g (ref 0.0–30.0)
Microalb, Ur: <0.7 mg/dL (ref 0.0–1.9)

## 2017-06-04 LAB — LIPID PANEL
Cholesterol: 202 mg/dL — ABNORMAL HIGH (ref 0–200)
HDL: 53.7 mg/dL (ref >39.00)
LDL Cholesterol: 126 mg/dL — ABNORMAL HIGH (ref 0–99)
NonHDL: 148.65
Total CHOL/HDL Ratio: 4
Triglycerides: 112 mg/dL (ref 0.0–149.0)
VLDL: 22.4 mg/dL (ref 0.0–40.0)

## 2017-06-04 LAB — HEMOGLOBIN A1C: Hgb A1c MFr Bld: 7.3 % — ABNORMAL HIGH (ref 4.6–6.5)

## 2017-06-06 ENCOUNTER — Telehealth: Payer: Self-pay | Admitting: Family Medicine

## 2017-06-06 NOTE — Telephone Encounter (Signed)
Pt called in and was given lab results.

## 2017-06-26 ENCOUNTER — Telehealth: Payer: Self-pay | Admitting: Family Medicine

## 2017-06-26 NOTE — Telephone Encounter (Signed)
Results given.  Pt states he occasionally misses cholesterol pill.  Pt states " I'm not going to take a needle." Pt wants to know is there anything else he can be prescribed?    (Please let the patient know his A1c is still above goal. I would suggest adding an additional medication such as non-insulin injectable medication. Please confirm whether or not he has been taking his cholesterol medication. Thanks.)

## 2017-06-26 NOTE — Telephone Encounter (Signed)
Please advise 

## 2017-06-26 NOTE — Telephone Encounter (Signed)
See result note.  

## 2017-06-27 ENCOUNTER — Other Ambulatory Visit: Payer: Self-pay | Admitting: Family Medicine

## 2017-06-27 MED ORDER — GLIPIZIDE 5 MG PO TABS: 2.5000 mg | ORAL_TABLET | Freq: Every day | ORAL | 3 refills | Status: DC

## 2017-07-10 ENCOUNTER — Other Ambulatory Visit: Payer: Self-pay | Admitting: Family Medicine

## 2017-07-27 ENCOUNTER — Other Ambulatory Visit: Payer: Self-pay | Admitting: Cardiovascular Disease

## 2017-08-01 ENCOUNTER — Telehealth: Payer: Self-pay

## 2017-08-01 NOTE — Telephone Encounter (Signed)
-----   Message from Sharon G Crespo, CMA sent at 07/30/2017  8:14 AM EST ----- Please contact patient for a follow up. Pt. Is requesting refills on medications.  Thanks, Sharon C 

## 2017-08-01 NOTE — Telephone Encounter (Signed)
l mom to call and schedule past due follow up

## 2017-08-09 NOTE — Telephone Encounter (Signed)
Per Ward Givenshris Berge, NP patient does not need lab work prior to.   No answer. Left message to call back and confirm he is able to come that time and date. Patient scheduled for appointment.

## 2017-08-09 NOTE — Telephone Encounter (Signed)
S/w patient. He needs an appointment that is on a Monday morning before 1:30 pm as his job starts at 1:30pm.  Financially, he is unable to take off time from work for come to an appointment on another day. Advised that Dr. Kirke CorinArida is not in clinic on Mondays and that APP's schedule is in the afternoon.  He refuses to miss work. Advised I would see if a provider would see patient late morning on a particular Monday. Patient also wanted to know if he needed lab work prior to appointment because he stated "I don't like to waste time and money." Last had LIPID and CMET in 05/2017 by PCP. Routing to [employer] and Chris.

## 2017-08-09 NOTE — Telephone Encounter (Signed)
-----   Message from Festus AloeSharon G Crespo, CMA sent at 07/30/2017  8:14 AM EST ----- Please contact patient for a follow up. Pt. Is requesting refills on medications.  Thanks, Humphrey RollsSharon C

## 2017-08-09 NOTE — Telephone Encounter (Signed)
I can prob see 1/28 @ 11:30.

## 2017-08-09 NOTE — Telephone Encounter (Signed)
Spoke with patient about making an appt for refills .  Patient states he can only come on Monday mornings. There are no Monday mornings available to schedule at this time.  Patient states he is not taking time off work to pay us to come here and we are just going to have to find him a Monday morning appt   Please advise

## 2017-08-09 NOTE — Telephone Encounter (Signed)
l mom to schedule f/u appt to refill medications.

## 2017-08-09 NOTE — Telephone Encounter (Signed)
No answer. Left message to call back.   

## 2017-08-11 ENCOUNTER — Other Ambulatory Visit: Payer: Self-pay | Admitting: Family Medicine

## 2017-08-20 ENCOUNTER — Ambulatory Visit (INDEPENDENT_AMBULATORY_CARE_PROVIDER_SITE_OTHER): Payer: BLUE CROSS/BLUE SHIELD | Admitting: Nurse Practitioner

## 2017-08-20 ENCOUNTER — Encounter: Payer: Self-pay | Admitting: Nurse Practitioner

## 2017-08-20 VITALS — BP 150/72 | HR 73 | Ht 71.0 in | Wt 305.0 lb

## 2017-08-20 DIAGNOSIS — I5022 Chronic systolic (congestive) heart failure: Secondary | ICD-10-CM | POA: Diagnosis not present

## 2017-08-20 DIAGNOSIS — I10 Essential (primary) hypertension: Secondary | ICD-10-CM

## 2017-08-20 DIAGNOSIS — I255 Ischemic cardiomyopathy: Secondary | ICD-10-CM | POA: Diagnosis not present

## 2017-08-20 DIAGNOSIS — E785 Hyperlipidemia, unspecified: Secondary | ICD-10-CM

## 2017-08-20 DIAGNOSIS — I2510 Atherosclerotic heart disease of native coronary artery without angina pectoris: Secondary | ICD-10-CM | POA: Diagnosis not present

## 2017-08-20 DIAGNOSIS — E119 Type 2 diabetes mellitus without complications: Secondary | ICD-10-CM

## 2017-08-20 MED ORDER — CARVEDILOL 12.5 MG PO TABS: 12.5000 mg | ORAL_TABLET | Freq: Two times a day (BID) | ORAL | 3 refills | Status: DC

## 2017-08-20 MED ORDER — SPIRONOLACTONE 25 MG PO TABS: 12.5000 mg | ORAL_TABLET | Freq: Every day | ORAL | 2 refills | Status: DC

## 2017-08-20 MED ORDER — LISINOPRIL 5 MG PO TABS: 5.0000 mg | ORAL_TABLET | Freq: Every day | ORAL | 2 refills | Status: DC

## 2017-08-20 MED ORDER — TICAGRELOR 60 MG PO TABS: 60.0000 mg | ORAL_TABLET | Freq: Two times a day (BID) | ORAL | 2 refills | Status: DC

## 2017-08-20 MED ORDER — ATORVASTATIN CALCIUM 80 MG PO TABS: 80.0000 mg | ORAL_TABLET | Freq: Every day | ORAL | 3 refills | Status: DC

## 2017-08-20 NOTE — Patient Instructions (Signed)
Medication Instructions:  Your physician has recommended you make the following change in your medication:  1- INCREASE Carvedilol to 12.5 mg by mouth two times a day.   Labwork: none  Testing/Procedures: none  Follow-Up: Your physician wants you to follow-up in: 6 MONTHS WITH DR ARIDA.  You will receive a reminder letter in the mail two months in advance. If you don't receive a letter, please call our office to schedule the follow-up appointment.   If you need a refill on your cardiac medications before your next appointment, please call your pharmacy.

## 2017-08-20 NOTE — Progress Notes (Signed)
Office Visit    Patient Name: Michael Jordan Date of Encounter: 08/20/2017  Primary Care Provider:  [name], MD Primary Cardiologist:  Lorine Bears, MD  Chief Complaint    62 y/o ? with a history of CAD status post prior LAD stenting, ischemic cardiomyopathy with an EF of 35-40%, hypertension, hyperlipidemia, obesity, diabetes, and history of tobacco abuse, who presents for follow-up.  Past Medical History    Past Medical History:  Diagnosis Date  . CAD (coronary artery disease)    a. 09/2015 Ant STEMI/PCI: LAD 100p (3.0x18 Xience Alpine DES), RCA 80p/176m (CTO)->fills via collats, EF 45-50%; b. 10/2015 MV: fixed septal/inf defects w/ dramatic ECG changes; c. 10/2015 Cath: LM nl, LAD patent stent, D1 40, SP1 40, LCX 60m, RCA 80p/161m, EF 45-50%-->Med Rx.  . Cardiomyopathy, ischemic    a. 09/2015 Echo: EF 35-40%, sev apical, periapical, antapical HK, mild to mod ant, inf HK, Gr1 DD.  Marland Kitchen History of tobacco abuse    a. Quit 05/2015.  Marland Kitchen Hyperlipidemia   . Morbid obesity (HCC)   . Type II diabetes mellitus (HCC)    Past Surgical History:  Procedure Laterality Date  . CARDIAC CATHETERIZATION N/A 09/30/2015   Procedure: Left Heart Cath and Coronary Angiography;  Surgeon: Iran Ouch, MD;  Location: [facility];  Service: Cardiovascular;  Laterality: N/A;  . CARDIAC CATHETERIZATION N/A 09/30/2015   Procedure: Coronary Stent Intervention;  Surgeon: Iran Ouch, MD;  Location: [facility];  Service: Cardiovascular;  Laterality: N/A;  . CARDIAC CATHETERIZATION N/A 11/11/2015   Procedure: Left Heart Cath and Coronary Angiography;  Surgeon: Iran Ouch, MD;  Location: [facility];  Service: Cardiovascular;  Laterality: N/A;  . TONSILLECTOMY      Allergies  No Known Allergies  History of Present Illness    62 year old ? with the above complex past medical history including coronary artery disease status post anterior STEMI in March 2017  with a finding of a totally occluded proximal LAD as well as a chronic total occlusion of the mid/distal right coronary artery.  The LAD was felt to be the infarct vessel and this was successfully treated with a drug-eluting stent.  EF was 35-40% by echocardiogram.  He subsequently underwent stress testing in April 2017, so that he may return to work and this was notable for significant ST segment changes with peak stress.  As result, relook catheterization was undertaken and showed patent LAD with otherwise stable anatomy.  He has been medically managed since.  Since his last visit here, he has continued to do reasonably well.  He goes to the gym about 3-4 days a week and walks on a treadmill.  He does not typically experience chest discomfort but says that ever since his MI, if he really pushes it harder tries to run that he might have mild discomfort in his chest.  This is fairly rare.  He otherwise continues to work full-time and also takes his wife shag dancing regularly.  In general, he has been doing well.  He does note that his compliance with his medications sometimes falls off and he is not sure that he is taking his cholesterol medicine as prescribed.  He either forgets to take it or he says that if he takes his medications as prescribed several weeks in a row, he sometimes has a "really bad day" and he has to hold everything in order to reset his body.  He had labs  in November where his LDL was 126.  That is his exact LDL from March 2017 prior to statin therapy.  Of note, LDL was 54 in April 2017 while taking his statin.  He denies PND, orthopnea, dizziness, syncope, palpitations, edema, or early satiety.  Home Medications    Prior to Admission medications   Medication Sig Start Date End Date Taking? Authorizing Provider  aspirin 81 MG chewable tablet Chew 1 tablet (81 mg total) by mouth daily. 10/02/15  Yes Antonieta Iba, MD  atorvastatin (LIPITOR) 80 MG tablet Take 1 tablet (80 mg total) by  mouth daily at 6 PM. 08/20/17  Yes Creig Hines, NP  glipiZIDE (GLUCOTROL) 5 MG tablet Take 0.5 tablets (2.5 mg total) by mouth daily before breakfast. 06/27/17  Yes Sonnenberg, Yehuda Mao, MD  JANUVIA 100 MG tablet TAKE 1 TABLET (100 MG TOTAL) BY MOUTH DAILY. 08/14/17  Yes [name], MD  JARDIANCE 25 MG TABS tablet TAKE 1 TABLET BY MOUTH EVERY DAY 07/10/17  Yes [name], MD  lisinopril (PRINIVIL,ZESTRIL) 5 MG tablet Take 1 tablet (5 mg total) by mouth daily. 08/20/17  Yes Creig Hines, NP  metFORMIN (GLUCOPHAGE) 1000 MG tablet TAKE 1 TABLET (1,000 MG TOTAL) BY MOUTH 2 (TWO) TIMES DAILY WITH A MEAL. 10/05/16  Yes [name], MD  nitroGLYCERIN (NITROSTAT) 0.4 MG SL tablet Place 1 tablet (0.4 mg total) under the tongue every 5 (five) minutes as needed for chest pain. 10/02/15  Yes Antonieta Iba, MD  spironolactone (ALDACTONE) 25 MG tablet Take 0.5 tablets (12.5 mg total) by mouth daily. 08/20/17  Yes Creig Hines, NP  ticagrelor (BRILINTA) 60 MG TABS tablet Take 1 tablet (60 mg total) by mouth 2 (two) times daily. 08/20/17  Yes Creig Hines, NP  carvedilol (COREG) 12.5 MG tablet Take 1 tablet (12.5 mg total) by mouth 2 (two) times daily. 08/20/17 11/18/17  Creig Hines, NP    Review of Systems    Overall doing well.  He occasionally notes chest discomfort with very high levels of activity and says this is stable and similar to what is has been experiencing ever since his MI.  He denies palpitations, dyspnea, PND, orthopnea, dizziness, syncope, edema, or early satiety all other systems reviewed and are otherwise negative except as noted above.  Physical Exam    VS:  BP (!) 150/72 (BP Location: Left Arm, Patient Position: Sitting, Cuff Size: Normal)   Pulse 73   Ht 5\' 11"  (1.803 m)   Wt (!) 305 lb (138.3 kg)   BMI 42.54 kg/m  , BMI Body mass index is 42.54 kg/m. GEN: Well nourished, well developed, in no acute  distress.  HEENT: normal.  Neck: Supple, obese, difficult to gauge JVP, no carotid bruits, or masses. Cardiac: RRR, no murmurs, rubs, or gallops. No clubbing, cyanosis, edema.  Radials/DP/PT 2+ and equal bilaterally.  Respiratory:  Respirations regular and unlabored, clear to auscultation bilaterally. GI: Obese, soft, nontender, nondistended, BS + x 4. MS: no deformity or atrophy. Skin: warm and dry, no rash. Neuro:  Strength and sensation are intact. Psych: Normal affect.  Accessory Clinical Findings    ECG -regular sinus rhythm, 73, incomplete right bundle branch block, no acute ST or T changes.  Assessment & Plan    1.  Coronary artery disease: Status post prior anterior STEMI with LAD drug-eluting stent placement in March 2017.  Stable anatomy on catheterization in April 2017.  He has continued to do  reasonably well and has stable symptoms.  He does experienced mild chest discomfort but only at very high levels of activity, which is rare.  The symptoms have been stable since his MI.  He otherwise remains relatively active without limitations.  He reports intermittent noncompliance with his medications, mostly due to forgetting to take something in the evening.  With he remains on aspirin, statin, beta-blocker, ACE inhibitor, and lower dose of Brilinta.  We will refill his medicines today.  2.  Essential hypertension: Blood pressure elevated today at 150/72.  He says it is typically in the 140s-150s at home.  I am going to increase his carvedilol to 12.5 mg twice daily.  3.  Hyperlipidemia: Recent lipids in November showing an LDL of 126.  LFTs were normal at that time.  His LDL was 126 in March 2017 but then dropped to 54 after being placed on Lipitor.  It is for this reason that I suspect he is not always compliant with his Lipitor, though he says he thinks he is mostly taking it.  I have asked him to pay special attention to his medications to make sure that he is getting his statin.  If  on follow-up lipids in 2019 LDL remains elevated despite compliance with Lipitor, I would have a low threshold to pursue a PCSK9i.  4.  Ischemic cardiomyopathy/chronic systolic congestive heart failure: EF 35-40% by echocardiogram in March 2017.  On relook cath in April 2017, EF was 45-50% by left ventriculography.  He is euvolemic on exam.  He remains on beta-blocker, ACE inhibitor, and Spironolactone.  He is also on Jardiance for management of his diabetes.  Stable labs in November.  We discussed the importance of daily weights, sodium restriction, medication compliance, and symptom reporting and he verbalizes understanding.   5.  Type 2 diabetes mellitus: On metformin, glipizide, Januvia, and Jardiance.  A1c 7.3 in November.  Followed by primary care.  6.  Morbid obesity: Despite exercising multiple days a week, his weight has been trending up over the past year.  Encouraged him to pay more attention to his caloric intake.  7.  Disposition: Follow-up with Dr. Kirke CorinArida in 6 months or sooner if necessary.   Nicolasa Duckinghristopher Aijalon Kirtz, NP 08/20/2017, 11:57 AM

## 2017-11-26 ENCOUNTER — Ambulatory Visit (INDEPENDENT_AMBULATORY_CARE_PROVIDER_SITE_OTHER): Payer: 59 | Admitting: Family Medicine

## 2017-11-26 ENCOUNTER — Telehealth: Payer: Self-pay | Admitting: Family Medicine

## 2017-11-26 ENCOUNTER — Encounter: Payer: Self-pay | Admitting: Family Medicine

## 2017-11-26 ENCOUNTER — Other Ambulatory Visit: Payer: Self-pay

## 2017-11-26 VITALS — BP 120/86 | HR 89 | Temp 98.0°F | Ht 71.0 in | Wt 309.8 lb

## 2017-11-26 DIAGNOSIS — E1159 Type 2 diabetes mellitus with other circulatory complications: Secondary | ICD-10-CM

## 2017-11-26 DIAGNOSIS — E785 Hyperlipidemia, unspecified: Secondary | ICD-10-CM

## 2017-11-26 DIAGNOSIS — E1165 Type 2 diabetes mellitus with hyperglycemia: Secondary | ICD-10-CM | POA: Diagnosis not present

## 2017-11-26 DIAGNOSIS — IMO0002 Reserved for concepts with insufficient information to code with codable children: Secondary | ICD-10-CM

## 2017-11-26 DIAGNOSIS — J309 Allergic rhinitis, unspecified: Secondary | ICD-10-CM

## 2017-11-26 DIAGNOSIS — I10 Essential (primary) hypertension: Secondary | ICD-10-CM

## 2017-11-26 LAB — COMPREHENSIVE METABOLIC PANEL
ALT: 19 U/L (ref 0–53)
AST: 31 U/L (ref 0–37)
Albumin: 4.1 g/dL (ref 3.5–5.2)
Alkaline Phosphatase: 81 U/L (ref 39–117)
BUN: 14 mg/dL (ref 6–23)
CO2: 22 mEq/L (ref 19–32)
Calcium: 9.4 mg/dL (ref 8.4–10.5)
Chloride: 104 mEq/L (ref 96–112)
Creatinine, Ser: 1.02 mg/dL (ref 0.40–1.50)
GFR: 78.39 mL/min (ref >60.00)
Glucose, Bld: 164 mg/dL — ABNORMAL HIGH (ref 70–99)
Potassium: 4.4 mEq/L (ref 3.5–5.1)
Sodium: 139 mEq/L (ref 135–145)
Total Bilirubin: 0.6 mg/dL (ref 0.2–1.2)
Total Protein: 7.3 g/dL (ref 6.0–8.3)

## 2017-11-26 LAB — HM DIABETES EYE EXAM

## 2017-11-26 LAB — HEMOGLOBIN A1C: Hgb A1c MFr Bld: 7.9 % — ABNORMAL HIGH (ref 4.6–6.5)

## 2017-11-26 LAB — LDL CHOLESTEROL, DIRECT: Direct LDL: 117 mg/dL

## 2017-11-26 NOTE — Patient Instructions (Signed)
Nice to see you. We will get lab work today and contact you with the results.  This will determine what we do with your medications.

## 2017-11-26 NOTE — Assessment & Plan Note (Addendum)
Relatively well controlled.  I suspect it would be better controlled if he could take his medications every day.  Discussed it would be a good idea to take his medications daily though we will await his lab work to give firm recommendations.

## 2017-11-26 NOTE — Assessment & Plan Note (Signed)
Continue Lipitor.  Check LDL.  Discussed he will likely need to take this daily.

## 2017-11-26 NOTE — Assessment & Plan Note (Signed)
Patient with occasional symptoms with pollen.  He will monitor at this time.

## 2017-11-26 NOTE — Telephone Encounter (Signed)
Reviewed lab results and physician's note with patient. He stated he was driving at the time he called and would phone back tomorrow so he can write down information. He also stated he would be on a regular regime taking his medications now because his wife's insurance is in effect.

## 2017-11-26 NOTE — Assessment & Plan Note (Signed)
Check A1c.  Continue current regimen.  Discussed the need to take his medications daily.

## 2017-11-26 NOTE — Telephone Encounter (Signed)
Copied from CRM [id number]. Topic: Quick Communication - Lab Results >> Nov 26, 2017  2:45 PM Inetta Fermo, CMA wrote: Left message to return call, ok for [facility] to give results and speak to patient and schedule with pharmacist if patient agrees

## 2017-11-26 NOTE — Progress Notes (Signed)
  Tommi Rumps, MD Phone: [phone]  Michael Jordan is a 62 y.o. male who presents today for f/u.  HYPERTENSION Disease Monitoring: Blood pressure range-130/70s-85 Chest pain- no      Dyspnea- no Medications: Compliance- taking coreg, lisinopril, spironolactone    Edema- no  DIABETES Disease Monitoring: Blood Sugar ranges-not checking Polyuria/phagia/dipsia- no      Optho- reports up to date Medications: Compliance- taking metformin, jardiance, januvia, glipizide Hypoglycemic symptoms- no  HYPERLIPIDEMIA Disease Monitoring: See symptoms for Hypertension Medications: Compliance- taking lipitor Right upper quadrant pain- no  Muscle aches- no  Patient reports he takes his meds on alternating days.  States he feels better when he does this.  Reports some allergy symptoms this time of year with the pollen.  Notes it is mostly phlegm production.  Occasionally will take over-the-counter allergy pill.  Typically resolves after the pollen goes away.  Social History   Tobacco Use  Smoking Status Former Smoker  . Packs/day: 0.50  . Years: 44.00  . Pack years: 22.00  . Types: Cigarettes  . Last attempt to quit: 06/02/2015  . Years since quitting: 2.4  Smokeless Tobacco Never Used     ROS see history of present illness  Objective  Physical Exam Vitals:   11/26/17 0802 11/26/17 0820  BP: 138/88 120/86  Pulse: 89   Temp: 98 F (36.7 C)   SpO2: 95%     BP Readings from Last 3 Encounters:  11/26/17 120/86  08/20/17 (!) 150/72  05/28/17 130/84   Wt Readings from Last 3 Encounters:  11/26/17 (!) 309 lb 12.8 oz (140.5 kg)  08/20/17 (!) 305 lb (138.3 kg)  05/28/17 (!) 302 lb 3.2 oz (137.1 kg)    Physical Exam  Constitutional: No distress.  Cardiovascular: Normal rate, regular rhythm and normal heart sounds.  Pulmonary/Chest: Effort normal and breath sounds normal.  Musculoskeletal: He exhibits no edema.  Neurological: He is alert.  Skin: Skin is warm and dry. He  is not diaphoretic.     Assessment/Plan: Please see individual problem list.  Uncontrolled type 2 diabetes mellitus with circulatory disorder (HCC) Check A1c.  Continue current regimen.  Discussed the need to take his medications daily.  Essential hypertension Relatively well controlled.  I suspect it would be better controlled if he could take his medications every day.  Discussed it would be a good idea to take his medications daily though we will await his lab work to give firm recommendations.  Hyperlipidemia Continue Lipitor.  Check LDL.  Discussed he will likely need to take this daily.  Allergic rhinitis Patient with occasional symptoms with pollen.  He will monitor at this time.   Health Maintenance: Foot exam completed.  Ophthalmology records requested.  Orders Placed This Encounter  Procedures  . Direct LDL  . Comp Met (CMET)  . HgB A1c    No orders of the defined types were placed in this encounter.    Tommi Rumps, MD [place]

## 2017-12-05 ENCOUNTER — Telehealth: Payer: Self-pay | Admitting: Family Medicine

## 2017-12-05 MED ORDER — LINAGLIPTIN 5 MG PO TABS: 5.0000 mg | ORAL_TABLET | Freq: Every day | ORAL | 3 refills | Status: DC

## 2017-12-05 NOTE — Telephone Encounter (Signed)
Pt dropped forms from [facility], medication denial. Please review and contact patient. Papers are up front in Dr. Purvis Sheffield color folder.

## 2017-12-05 NOTE — Telephone Encounter (Signed)
fyi

## 2017-12-05 NOTE — Telephone Encounter (Signed)
Patient stated he will try the tradjenta.

## 2017-12-05 NOTE — Telephone Encounter (Signed)
Left message to return call, ok for pec to speak to patient about message below and ask if he is ok with switching

## 2017-12-05 NOTE — Telephone Encounter (Signed)
Patient returning Jessica's phone call. Relayed the message below and patient has some other questions. Please advise. CB#: [phone]

## 2017-12-05 NOTE — Telephone Encounter (Signed)
Placed in red folder  

## 2017-12-05 NOTE — Telephone Encounter (Signed)
Appears that they denied approval of his Januvia.  It appears that they would cover an alternative medication in the same class called Tradjenta.  If he is willing to try this I can send that to his pharmacy.  Thanks.

## 2017-12-05 NOTE — Telephone Encounter (Signed)
Sent to pharmacy 

## 2018-01-29 ENCOUNTER — Other Ambulatory Visit: Payer: Self-pay | Admitting: Family Medicine

## 2018-06-04 ENCOUNTER — Telehealth: Payer: Self-pay

## 2018-06-04 MED ORDER — LISINOPRIL 5 MG PO TABS: 5.0000 mg | ORAL_TABLET | Freq: Every day | ORAL | 1 refills | Status: DC

## 2018-06-04 NOTE — Telephone Encounter (Signed)
Refill of lisinopril sent to local pharmacy.

## 2018-06-07 ENCOUNTER — Other Ambulatory Visit: Payer: Self-pay | Admitting: Family Medicine

## 2018-09-05 ENCOUNTER — Telehealth: Payer: Self-pay | Admitting: Cardiovascular Disease

## 2018-09-05 ENCOUNTER — Other Ambulatory Visit: Payer: Self-pay | Admitting: *Deleted

## 2018-09-05 MED ORDER — ATORVASTATIN CALCIUM 80 MG PO TABS: 80.0000 mg | ORAL_TABLET | Freq: Every day | ORAL | 1 refills | Status: DC

## 2018-09-05 NOTE — Telephone Encounter (Signed)
LMOV to schedule a follow up with Dr Arida  °

## 2018-09-05 NOTE — Telephone Encounter (Signed)
-----   Message from Festus Aloe, CMA sent at 09/05/2018  2:22 PM EST ----- Please contact patient for a follow up.  The patient was to follow up in July 2019.  Thanks, Jasmine December

## 2018-09-06 ENCOUNTER — Other Ambulatory Visit: Payer: Self-pay

## 2018-09-06 MED ORDER — CARVEDILOL 12.5 MG PO TABS: 12.5000 mg | ORAL_TABLET | Freq: Two times a day (BID) | ORAL | 0 refills | Status: DC

## 2018-09-20 ENCOUNTER — Other Ambulatory Visit: Payer: Self-pay | Admitting: Cardiovascular Disease

## 2018-09-20 ENCOUNTER — Other Ambulatory Visit: Payer: Self-pay | Admitting: *Deleted

## 2018-09-20 MED ORDER — CARVEDILOL 12.5 MG PO TABS: 12.5000 mg | ORAL_TABLET | Freq: Two times a day (BID) | ORAL | 0 refills | Status: DC

## 2018-10-03 NOTE — Telephone Encounter (Signed)
lmov to schedule appt  °

## 2018-10-24 NOTE — Telephone Encounter (Signed)
LMOV to schedule  

## 2019-12-11 ENCOUNTER — Other Ambulatory Visit: Payer: Self-pay

## 2019-12-11 ENCOUNTER — Encounter: Payer: Self-pay | Admitting: Internal Medicine

## 2019-12-11 ENCOUNTER — Ambulatory Visit (INDEPENDENT_AMBULATORY_CARE_PROVIDER_SITE_OTHER): Payer: BC Managed Care – PPO

## 2019-12-11 ENCOUNTER — Ambulatory Visit (INDEPENDENT_AMBULATORY_CARE_PROVIDER_SITE_OTHER): Payer: BC Managed Care – PPO | Admitting: Internal Medicine

## 2019-12-11 VITALS — BP 170/90 | HR 104 | Temp 98.5°F | Ht 71.0 in | Wt 287.0 lb

## 2019-12-11 DIAGNOSIS — R0602 Shortness of breath: Secondary | ICD-10-CM

## 2019-12-11 DIAGNOSIS — T148XXA Other injury of unspecified body region, initial encounter: Secondary | ICD-10-CM

## 2019-12-11 DIAGNOSIS — Z23 Encounter for immunization: Secondary | ICD-10-CM | POA: Diagnosis not present

## 2019-12-11 DIAGNOSIS — E1165 Type 2 diabetes mellitus with hyperglycemia: Secondary | ICD-10-CM | POA: Diagnosis not present

## 2019-12-11 DIAGNOSIS — Z1329 Encounter for screening for other suspected endocrine disorder: Secondary | ICD-10-CM

## 2019-12-11 DIAGNOSIS — Z9114 Patient's other noncompliance with medication regimen: Secondary | ICD-10-CM

## 2019-12-11 DIAGNOSIS — E559 Vitamin D deficiency, unspecified: Secondary | ICD-10-CM

## 2019-12-11 DIAGNOSIS — I2510 Atherosclerotic heart disease of native coronary artery without angina pectoris: Secondary | ICD-10-CM

## 2019-12-11 DIAGNOSIS — I10 Essential (primary) hypertension: Secondary | ICD-10-CM | POA: Diagnosis not present

## 2019-12-11 DIAGNOSIS — L03114 Cellulitis of left upper limb: Secondary | ICD-10-CM

## 2019-12-11 DIAGNOSIS — Z125 Encounter for screening for malignant neoplasm of prostate: Secondary | ICD-10-CM

## 2019-12-11 DIAGNOSIS — Z91148 Patient's other noncompliance with medication regimen for other reason: Secondary | ICD-10-CM

## 2019-12-11 DIAGNOSIS — I5022 Chronic systolic (congestive) heart failure: Secondary | ICD-10-CM

## 2019-12-11 MED ORDER — ATORVASTATIN CALCIUM 80 MG PO TABS: 80.0000 mg | ORAL_TABLET | Freq: Every day | ORAL | 0 refills | Status: DC

## 2019-12-11 MED ORDER — CARVEDILOL 12.5 MG PO TABS: 12.5000 mg | ORAL_TABLET | Freq: Two times a day (BID) | ORAL | 0 refills | Status: DC

## 2019-12-11 MED ORDER — MUPIROCIN 2 % EX OINT: 1.0000 "application " | TOPICAL_OINTMENT | Freq: Three times a day (TID) | CUTANEOUS | 0 refills | Status: DC

## 2019-12-11 MED ORDER — GLIPIZIDE 5 MG PO TABS: 2.5000 mg | ORAL_TABLET | Freq: Every day | ORAL | 0 refills | Status: DC

## 2019-12-11 MED ORDER — LISINOPRIL 5 MG PO TABS: 5.0000 mg | ORAL_TABLET | Freq: Every day | ORAL | 0 refills | Status: DC

## 2019-12-11 MED ORDER — SPIRONOLACTONE 25 MG PO TABS: 12.5000 mg | ORAL_TABLET | Freq: Every day | ORAL | 0 refills | Status: DC

## 2019-12-11 MED ORDER — DOXYCYCLINE HYCLATE 100 MG PO TABS: 100.0000 mg | ORAL_TABLET | Freq: Two times a day (BID) | ORAL | 0 refills | Status: DC

## 2019-12-11 MED ORDER — NITROGLYCERIN 0.4 MG SL SUBL: 0.4000 mg | SUBLINGUAL_TABLET | SUBLINGUAL | 0 refills | Status: DC | PRN

## 2019-12-11 MED ORDER — LINAGLIPTIN 5 MG PO TABS: 5.0000 mg | ORAL_TABLET | Freq: Every day | ORAL | 0 refills | Status: DC

## 2019-12-11 MED ORDER — METFORMIN HCL 1000 MG PO TABS: 1000.0000 mg | ORAL_TABLET | Freq: Two times a day (BID) | ORAL | 0 refills | Status: DC

## 2019-12-11 MED ORDER — JARDIANCE 25 MG PO TABS: 25.0000 mg | ORAL_TABLET | Freq: Every day | ORAL | 0 refills | Status: DC

## 2019-12-11 NOTE — Patient Instructions (Signed)
https://www.cdc.gov/vaccines/hcp/vis/vis-statements/tdap.pdf">  Tdap (Tetanus, Diphtheria, Pertussis) Vaccine: What You Need to Know 1. Why get vaccinated? Tdap vaccine can prevent tetanus, diphtheria, and pertussis. Diphtheria and pertussis spread from person to person. Tetanus enters the body through cuts or wounds.  TETANUS (T) causes painful stiffening of the muscles. Tetanus can lead to serious health problems, including being unable to open the mouth, having trouble swallowing and breathing, or death.  DIPHTHERIA (D) can lead to difficulty breathing, heart failure, paralysis, or death.  PERTUSSIS (aP), also known as "whooping cough," can cause uncontrollable, violent coughing which makes it hard to breathe, eat, or drink. Pertussis can be extremely serious in babies and young children, causing pneumonia, convulsions, brain damage, or death. In teens and adults, it can cause weight loss, loss of bladder control, passing out, and rib fractures from severe coughing. 2. Tdap vaccine Tdap is only for children 7 years and older, adolescents, and adults.  Adolescents should receive a single dose of Tdap, preferably at age 53 or 35 years. Pregnant women should get a dose of Tdap during every pregnancy, to protect the newborn from pertussis. Infants are most at risk for severe, life-threatening complications from pertussis. Adults who have never received Tdap should get a dose of Tdap. Also, adults should receive a booster dose every 10 years, or earlier in the case of a severe and dirty wound or burn. Booster doses can be either Tdap or Td (a different vaccine that protects against tetanus and diphtheria but not pertussis). Tdap may be given at the same time as other vaccines. 3. Talk with your health care provider Tell your vaccine provider if the person getting the vaccine:  Has had an allergic reaction after a previous dose of any vaccine that protects against tetanus, diphtheria, or pertussis,  or has any severe, life-threatening allergies.  Has had a coma, decreased level of consciousness, or prolonged seizures within 7 days after a previous dose of any pertussis vaccine (DTP, DTaP, or Tdap).  Has seizures or another nervous system problem.  Has ever had Guillain-Barr Syndrome (also called GBS).  Has had severe pain or swelling after a previous dose of any vaccine that protects against tetanus or diphtheria. In some cases, your health care provider may decide to postpone Tdap vaccination to a future visit.  People with minor illnesses, such as a cold, may be vaccinated. People who are moderately or severely ill should usually wait until they recover before getting Tdap vaccine.  Your health care provider can give you more information. 4. Risks of a vaccine reaction  Pain, redness, or swelling where the shot was given, mild fever, headache, feeling tired, and nausea, vomiting, diarrhea, or stomachache sometimes happen after Tdap vaccine. People sometimes faint after medical procedures, including vaccination. Tell your provider if you feel dizzy or have vision changes or ringing in the ears.  As with any medicine, there is a very remote chance of a vaccine causing a severe allergic reaction, other serious injury, or death. 5. What if there is a serious problem? An allergic reaction could occur after the vaccinated person leaves the clinic. If you see signs of a severe allergic reaction (hives, swelling of the face and throat, difficulty breathing, a fast heartbeat, dizziness, or weakness), call 9-1-1 and get the person to the nearest hospital. For other signs that concern you, call your health care provider.  Adverse reactions should be reported to the Vaccine Adverse Event Reporting System (VAERS). Your health care provider will usually file this report,  or you can do it yourself. Visit the VAERS website at www.vaers.LAgents.no or call 236 194 5830. VAERS is only for reporting  reactions, and VAERS staff do not give medical advice. 6. The National Vaccine Injury Compensation Program The [employer] Vaccine Injury Compensation Program (VICP) is a federal program that was created to compensate people who may have been injured by certain vaccines. Visit the VICP website at SpiritualWord.at or call (940)104-1285 to learn about the program and about filing a claim. There is a time limit to file a claim for compensation. 7. How can I learn more?  Ask your health care provider.  Call your local or state health department.  Contact the Centers for Disease Control and Prevention (CDC): ? Call 754-861-5244 (1-800-CDC-INFO) or ? Visit CDC's website at PicCapture.uy Vaccine Information Statement Tdap (Tetanus, Diphtheria, Pertussis) Vaccine (10/23/2018) This information is not intended to replace advice given to you by your health care provider. Make sure you discuss any questions you have with your health care provider. Document Revised: 11/01/2018 Document Reviewed: 11/04/2018 Elsevier Patient Education  2020 Elsevier Inc.  Cellulitis, Adult  Cellulitis is a skin infection. The infected area is usually warm, red, swollen, and tender. This condition occurs most often in the arms and lower legs. The infection can travel to the muscles, blood, and underlying tissue and become serious. It is very important to get treated for this condition. What are the causes? Cellulitis is caused by bacteria. The bacteria enter through a break in the skin, such as a cut, burn, insect bite, open sore, or crack. What increases the risk? This condition is more likely to occur in people who:  Have a weak body defense system (immune system).  Have open wounds on the skin, such as cuts, burns, bites, and scrapes. Bacteria can enter the body through these open wounds.  Are older than 65 years of age.  Have diabetes.  Have a type of long-lasting (chronic) liver disease  (cirrhosis) or kidney disease.  Are obese.  Have a skin condition such as: ? Itchy rash (eczema). ? Slow movement of blood in the veins (venous stasis). ? Fluid buildup below the skin (edema).  Have had radiation therapy.  Use IV drugs. What are the signs or symptoms? Symptoms of this condition include:  Redness, streaking, or spotting on the skin.  Swollen area of the skin.  Tenderness or pain when an area of the skin is touched.  Warm skin.  A fever.  Chills.  Blisters. How is this diagnosed? This condition is diagnosed based on a medical history and physical exam. You may also have tests, including:  Blood tests.  Imaging tests. How is this treated? Treatment for this condition may include:  Medicines, such as antibiotic medicines or medicines to treat allergies (antihistamines).  Supportive care, such as rest and application of cold or warm cloths (compresses) to the skin.  Hospital care, if the condition is severe. The infection usually starts to get better within 1-2 days of treatment. Follow these instructions at home:  Medicines  Take over-the-counter and prescription medicines only as told by your health care provider.  If you were prescribed an antibiotic medicine, take it as told by your health care provider. Do not stop taking the antibiotic even if you start to feel better. General instructions  Drink enough fluid to keep your urine pale yellow.  Do not touch or rub the infected area.  Raise (elevate) the infected area above the level of your heart while you are sitting  or lying down.  Apply warm or cold compresses to the affected area as told by your health care provider.  Keep all follow-up visits as told by your health care provider. This is important. These visits let your health care provider make sure a more serious infection is not developing. Contact a health care provider if:  You have a fever.  Your symptoms do not begin to  improve within 1-2 days of starting treatment.  Your bone or joint underneath the infected area becomes painful after the skin has healed.  Your infection returns in the same area or another area.  You notice a swollen bump in the infected area.  You develop new symptoms.  You have a general ill feeling (malaise) with muscle aches and pains. Get help right away if:  Your symptoms get worse.  You feel very sleepy.  You develop vomiting or diarrhea that persists.  You notice red streaks coming from the infected area.  Your red area gets larger or turns dark in color. These symptoms may represent a serious problem that is an emergency. Do not wait to see if the symptoms will go away. Get medical help right away. Call your local emergency services (911 in the [place] Do not drive yourself to the hospital. Summary  Cellulitis is a skin infection. This condition occurs most often in the arms and lower legs.  Treatment for this condition may include medicines, such as antibiotic medicines or antihistamines.  Take over-the-counter and prescription medicines only as told by your health care provider. If you were prescribed an antibiotic medicine, do not stop taking the antibiotic even if you start to feel better.  Contact a health care provider if your symptoms do not begin to improve within 1-2 days of starting treatment or your symptoms get worse.  Keep all follow-up visits as told by your health care provider. This is important. These visits let your health care provider make sure that a more serious infection is not developing. This information is not intended to replace advice given to you by your health care provider. Make sure you discuss any questions you have with your health care provider. Document Revised: 11/29/2017 Document Reviewed: 11/29/2017 Elsevier Patient Education  2020 Elsevier Inc.  DASH Eating Plan DASH stands for "Dietary Approaches to Stop Hypertension." The  DASH eating plan is a healthy eating plan that has been shown to reduce high blood pressure (hypertension). It may also reduce your risk for type 2 diabetes, heart disease, and stroke. The DASH eating plan may also help with weight loss. What are tips for following this plan?  General guidelines  Avoid eating more than 2,300 mg (milligrams) of salt (sodium) a day. If you have hypertension, you may need to reduce your sodium intake to 1,500 mg a day.  Limit alcohol intake to no more than 1 drink a day for nonpregnant women and 2 drinks a day for men. One drink equals 12 oz of beer, 5 oz of wine, or 1 oz of hard liquor.  Work with your health care provider to maintain a healthy body weight or to lose weight. Ask what an ideal weight is for you.  Get at least 30 minutes of exercise that causes your heart to beat faster (aerobic exercise) most days of the week. Activities may include walking, swimming, or biking.  Work with your health care provider or diet and nutrition specialist (dietitian) to adjust your eating plan to your individual calorie needs. Reading food labels  Check food labels for the amount of sodium per serving. Choose foods with less than 5 percent of the Daily Value of sodium. Generally, foods with less than 300 mg of sodium per serving fit into this eating plan.  To find whole grains, look for the word "whole" as the first word in the ingredient list. Shopping  Buy products labeled as "low-sodium" or "no salt added."  Buy fresh foods. Avoid canned foods and premade or frozen meals. Cooking  Avoid adding salt when cooking. Use salt-free seasonings or herbs instead of table salt or sea salt. Check with your health care provider or pharmacist before using salt substitutes.  Do not fry foods. Cook foods using healthy methods such as baking, boiling, grilling, and broiling instead.  Cook with heart-healthy oils, such as olive, canola, soybean, or sunflower oil. Meal  planning  Eat a balanced diet that includes: ? 5 or more servings of fruits and vegetables each day. At each meal, try to fill half of your plate with fruits and vegetables. ? Up to 6-8 servings of whole grains each day. ? Less than 6 oz of lean meat, poultry, or fish each day. A 3-oz serving of meat is about the same size as a deck of cards. One egg equals 1 oz. ? 2 servings of low-fat dairy each day. ? A serving of nuts, seeds, or beans 5 times each week. ? Heart-healthy fats. Healthy fats called Omega-3 fatty acids are found in foods such as flaxseeds and coldwater fish, like sardines, salmon, and mackerel.  Limit how much you eat of the following: ? Canned or prepackaged foods. ? Food that is high in trans fat, such as fried foods. ? Food that is high in saturated fat, such as fatty meat. ? Sweets, desserts, sugary drinks, and other foods with added sugar. ? Full-fat dairy products.  Do not salt foods before eating.  Try to eat at least 2 vegetarian meals each week.  Eat more home-cooked food and less restaurant, buffet, and fast food.  When eating at a restaurant, ask that your food be prepared with less salt or no salt, if possible. What foods are recommended? The items listed may not be a complete list. Talk with your dietitian about what dietary choices are best for you. Grains Whole-grain or whole-wheat bread. Whole-grain or whole-wheat pasta. Brown rice. Orpah Cobb. Bulgur. Whole-grain and low-sodium cereals. Pita bread. Low-fat, low-sodium crackers. Whole-wheat flour tortillas. Vegetables Fresh or frozen vegetables (raw, steamed, roasted, or grilled). Low-sodium or reduced-sodium tomato and vegetable juice. Low-sodium or reduced-sodium tomato sauce and tomato paste. Low-sodium or reduced-sodium canned vegetables. Fruits All fresh, dried, or frozen fruit. Canned fruit in natural juice (without added sugar). Meat and other protein foods Skinless chicken or [place].  Ground chicken or [place]. Pork with fat trimmed off. Fish and seafood. Egg whites. Dried beans, peas, or lentils. Unsalted nuts, nut butters, and seeds. Unsalted canned beans. Lean cuts of beef with fat trimmed off. Low-sodium, lean deli meat. Dairy Low-fat (1%) or fat-free (skim) milk. Fat-free, low-fat, or reduced-fat cheeses. Nonfat, low-sodium ricotta or cottage cheese. Low-fat or nonfat yogurt. Low-fat, low-sodium cheese. Fats and oils Soft margarine without trans fats. Vegetable oil. Low-fat, reduced-fat, or light mayonnaise and salad dressings (reduced-sodium). Canola, safflower, olive, soybean, and sunflower oils. Avocado. Seasoning and other foods Herbs. Spices. Seasoning mixes without salt. Unsalted popcorn and pretzels. Fat-free sweets. What foods are not recommended? The items listed may not be a complete list. Talk with your dietitian about  what dietary choices are best for you. Grains Baked goods made with fat, such as croissants, muffins, or some breads. Dry pasta or rice meal packs. Vegetables Creamed or fried vegetables. Vegetables in a cheese sauce. Regular canned vegetables (not low-sodium or reduced-sodium). Regular canned tomato sauce and paste (not low-sodium or reduced-sodium). Regular tomato and vegetable juice (not low-sodium or reduced-sodium). Rosita Fire. Olives. Fruits Canned fruit in a light or heavy syrup. Fried fruit. Fruit in cream or butter sauce. Meat and other protein foods Fatty cuts of meat. Ribs. Fried meat. Tomasa Blase. Sausage. Bologna and other processed lunch meats. Salami. Fatback. Hotdogs. Bratwurst. Salted nuts and seeds. Canned beans with added salt. Canned or smoked fish. Whole eggs or egg yolks. Chicken or [place] with skin. Dairy Whole or 2% milk, cream, and half-and-half. Whole or full-fat cream cheese. Whole-fat or sweetened yogurt. Full-fat cheese. Nondairy creamers. Whipped toppings. Processed cheese and cheese spreads. Fats and oils Butter. Stick  margarine. Lard. Shortening. Ghee. Bacon fat. Tropical oils, such as coconut, palm kernel, or palm oil. Seasoning and other foods Salted popcorn and pretzels. Onion salt, garlic salt, seasoned salt, table salt, and sea salt. Worcestershire sauce. Tartar sauce. Barbecue sauce. Teriyaki sauce. Soy sauce, including reduced-sodium. Steak sauce. Canned and packaged gravies. Fish sauce. Oyster sauce. Cocktail sauce. Horseradish that you find on the shelf. Ketchup. Mustard. Meat flavorings and tenderizers. Bouillon cubes. Hot sauce and Tabasco sauce. Premade or packaged marinades. Premade or packaged taco seasonings. Relishes. Regular salad dressings. Where to find more information:  National Heart, Lung, and Blood Institute: PopSteam.is  American Heart Association: www.heart.org Summary  The DASH eating plan is a healthy eating plan that has been shown to reduce high blood pressure (hypertension). It may also reduce your risk for type 2 diabetes, heart disease, and stroke.  With the DASH eating plan, you should limit salt (sodium) intake to 2,300 mg a day. If you have hypertension, you may need to reduce your sodium intake to 1,500 mg a day.  When on the DASH eating plan, aim to eat more fresh fruits and vegetables, whole grains, lean proteins, low-fat dairy, and heart-healthy fats.  Work with your health care provider or diet and nutrition specialist (dietitian) to adjust your eating plan to your individual calorie needs. This information is not intended to replace advice given to you by your health care provider. Make sure you discuss any questions you have with your health care provider. Document Revised: 06/22/2017 Document Reviewed: 07/03/2016 Elsevier Patient Education  2020 [employer].  Diabetes Mellitus and Nutrition, Adult When you have diabetes (diabetes mellitus), it is very important to have healthy eating habits because your blood sugar (glucose) levels are greatly affected by  what you eat and drink. Eating healthy foods in the appropriate amounts, at about the same times every day, can help you:  Control your blood glucose.  Lower your risk of heart disease.  Improve your blood pressure.  Reach or maintain a healthy weight. Every person with diabetes is different, and each person has different needs for a meal plan. Your health care provider may recommend that you work with a diet and nutrition specialist (dietitian) to make a meal plan that is best for you. Your meal plan may vary depending on factors such as:  The calories you need.  The medicines you take.  Your weight.  Your blood glucose, blood pressure, and cholesterol levels.  Your activity level.  Other health conditions you have, such as heart or kidney disease. How do  carbohydrates affect me? Carbohydrates, also called carbs, affect your blood glucose level more than any other type of food. Eating carbs naturally raises the amount of glucose in your blood. Carb counting is a method for keeping track of how many carbs you eat. Counting carbs is important to keep your blood glucose at a healthy level, especially if you use insulin or take certain oral diabetes medicines. It is important to know how many carbs you can safely have in each meal. This is different for every person. Your dietitian can help you calculate how many carbs you should have at each meal and for each snack. Foods that contain carbs include:  Bread, cereal, rice, pasta, and crackers.  Potatoes and corn.  Peas, beans, and lentils.  Milk and yogurt.  Fruit and juice.  Desserts, such as cakes, cookies, ice cream, and candy. How does alcohol affect me? Alcohol can cause a sudden decrease in blood glucose (hypoglycemia), especially if you use insulin or take certain oral diabetes medicines. Hypoglycemia can be a life-threatening condition. Symptoms of hypoglycemia (sleepiness, dizziness, and confusion) are similar to symptoms  of having too much alcohol. If your health care provider says that alcohol is safe for you, follow these guidelines:  Limit alcohol intake to no more than 1 drink per day for nonpregnant women and 2 drinks per day for men. One drink equals 12 oz of beer, 5 oz of wine, or 1 oz of hard liquor.  Do not drink on an empty stomach.  Keep yourself hydrated with water, diet soda, or unsweetened iced tea.  Keep in mind that regular soda, juice, and other mixers may contain a lot of sugar and must be counted as carbs. What are tips for following this plan?  Reading food labels  Start by checking the serving size on the "Nutrition Facts" label of packaged foods and drinks. The amount of calories, carbs, fats, and other nutrients listed on the label is based on one serving of the item. Many items contain more than one serving per package.  Check the total grams (g) of carbs in one serving. You can calculate the number of servings of carbs in one serving by dividing the total carbs by 15. For example, if a food has 30 g of total carbs, it would be equal to 2 servings of carbs.  Check the number of grams (g) of saturated and trans fats in one serving. Choose foods that have low or no amount of these fats.  Check the number of milligrams (mg) of salt (sodium) in one serving. Most people should limit total sodium intake to less than 2,300 mg per day.  Always check the nutrition information of foods labeled as "low-fat" or "nonfat". These foods may be higher in added sugar or refined carbs and should be avoided.  Talk to your dietitian to identify your daily goals for nutrients listed on the label. Shopping  Avoid buying canned, premade, or processed foods. These foods tend to be high in fat, sodium, and added sugar.  Shop around the outside edge of the grocery store. This includes fresh fruits and vegetables, bulk grains, fresh meats, and fresh dairy. Cooking  Use low-heat cooking methods, such as  baking, instead of high-heat cooking methods like deep frying.  Cook using healthy oils, such as olive, canola, or sunflower oil.  Avoid cooking with butter, cream, or high-fat meats. Meal planning  Eat meals and snacks regularly, preferably at the same times every day. Avoid going long periods  of time without eating.  Eat foods high in fiber, such as fresh fruits, vegetables, beans, and whole grains. Talk to your dietitian about how many servings of carbs you can eat at each meal.  Eat 4-6 ounces (oz) of lean protein each day, such as lean meat, chicken, fish, eggs, or tofu. One oz of lean protein is equal to: ? 1 oz of meat, chicken, or fish. ? 1 egg. ?  cup of tofu.  Eat some foods each day that contain healthy fats, such as avocado, nuts, seeds, and fish. Lifestyle  Check your blood glucose regularly.  Exercise regularly as told by your health care provider. This may include: ? 150 minutes of moderate-intensity or vigorous-intensity exercise each week. This could be brisk walking, biking, or water aerobics. ? Stretching and doing strength exercises, such as yoga or weightlifting, at least 2 times a week.  Take medicines as told by your health care provider.  Do not use any products that contain nicotine or tobacco, such as cigarettes and e-cigarettes. If you need help quitting, ask your health care provider.  Work with a Social worker or diabetes educator to identify strategies to manage stress and any emotional and social challenges. Questions to ask a health care provider  Do I need to meet with a diabetes educator?  Do I need to meet with a dietitian?  What number can I call if I have questions?  When are the best times to check my blood glucose? Where to find more information:  American Diabetes Association: diabetes.org  Academy of Nutrition and Dietetics: www.eatright.[employer] of Diabetes and Digestive and Kidney Diseases (NIH): [employer].dk  Summary  A healthy meal plan will help you control your blood glucose and maintain a healthy lifestyle.  Working with a diet and nutrition specialist (dietitian) can help you make a meal plan that is best for you.  Keep in mind that carbohydrates (carbs) and alcohol have immediate effects on your blood glucose levels. It is important to count carbs and to use alcohol carefully. This information is not intended to replace advice given to you by your health care provider. Make sure you discuss any questions you have with your health care provider. Document Revised: 06/22/2017 Document Reviewed: 08/14/2016 Elsevier Patient Education  2020 [employer].

## 2019-12-11 NOTE — Progress Notes (Addendum)
Chief Complaint  Patient presents with  . Arm Pain    swelling, redness, and pain in the left forearm. Pt states he has not been bitten, fell, or any know injuries. Suspected Cellulitis pain 9/10  . Arm Swelling    Onset of 3 days ago   F/u with wife  1. Chronic CHF/CAD s/p stent x 2 needs to f/u cards Dr. Kirke Corin last EF in 2017 35-40 % c/o sob with exertion he is noncompliant with meds  2. Reason for visit left arm cellulitis since Sunday red/hot/painful/swollen worse x 3 days nothing tried working in yard pulling weeks brush when had abrasion/open wound from brush 3. HTN noncompliant with meds lis 5 mg qd,spironolactone 25 mg qd, coreg 12.5 mg bid and no f/u with PCP since 2019 and cards 2018 4. Former smoker c/o sob  5. Increased urination  6. DM 2 n/c with meds last A1c 7.9 on glucotorl 5 mg qd, tradjenta 5 mg qd, metformin 1000 mg bid, jardiance 25 but he has not been taking per pt and wife   Review of Systems  Constitutional: Negative for weight loss.  HENT: Negative for hearing loss.   Eyes: Negative for blurred vision.  Respiratory: Positive for shortness of breath.   Cardiovascular: Negative for chest pain.  Gastrointestinal: Negative for abdominal pain.  Musculoskeletal: Negative for falls.  Skin: Positive for rash.  Neurological: Negative for headaches.  Psychiatric/Behavioral: Negative for memory loss.   Past Medical History:  Diagnosis Date  . Abnormal stress test   . CAD (coronary artery disease)    a. 09/2015 Ant STEMI/PCI: LAD 100p (3.0x18 Xience Alpine DES), RCA 80p/151m (CTO)->fills via collats, EF 45-50%; b. 10/2015 MV: fixed septal/inf defects w/ dramatic ECG changes; c. 10/2015 Cath: LM nl, LAD patent stent, D1 40, SP1 40, LCX 57m, RCA 80p/164m, EF 45-50%-->Med Rx.  . Cardiomyopathy, ischemic    a. 09/2015 Echo: EF 35-40%, sev apical, periapical, antapical HK, mild to mod ant, inf HK, Gr1 DD.  Marland Kitchen History of tobacco abuse    a. Quit 05/2015.  Marland Kitchen Hyperlipidemia   .  Morbid obesity (HCC)   . Tobacco abuse 10/01/2015  . Type II diabetes mellitus (HCC)    Past Surgical History:  Procedure Laterality Date  . CARDIAC CATHETERIZATION N/A 09/30/2015   Procedure: Left Heart Cath and Coronary Angiography;  Surgeon: Iran Ouch, MD;  Location: [facility]IVE CV [facility];  Service: Cardiovascular;  Laterality: N/A;  . CARDIAC CATHETERIZATION N/A 09/30/2015   Procedure: Coronary Stent Intervention;  Surgeon: Iran Ouch, MD;  Location: [facility] CV [facility];  Service: Cardiovascular;  Laterality: N/A;  . CARDIAC CATHETERIZATION N/A 11/11/2015   Procedure: Left Heart Cath and Coronary Angiography;  Surgeon: Iran Ouch, MD;  Location: [facility] CV [facility];  Service: Cardiovascular;  Laterality: N/A;  . TONSILLECTOMY     Family History  Problem Relation Age of Onset  . Diabetes Mother   . Heart attack Mother   . Heart attack Father   . Heart attack Paternal Grandfather    Social History   Socioeconomic History  . Marital status: Married    Spouse name: Not on file  . Number of children: Not on file  . Years of education: Not on file  . Highest education level: Not on file  Occupational History  . Not on file  Tobacco Use  . Smoking status: Former Smoker    Packs/day: 0.50    Years: 44.00    Pack years: 22.00  Types: Cigarettes    Quit date: 06/02/2015    Years since quitting: 4.5  . Smokeless tobacco: Never Used  Substance and Sexual Activity  . Alcohol use: Yes    Alcohol/week: 0.0 standard drinks  . Drug use: No  . Sexual activity: Not on file  Other Topics Concern  . Not on file  Social History Narrative  . Not on file   Social Determinants of Health   Financial Resource Strain:   . Difficulty of Paying Living Expenses:   Food Insecurity:   . Worried About Programme researcher, broadcasting/film/video in the Last Year:   . Barista in the Last Year:   Transportation Needs:   . Freight forwarder (Medical):   Marland Kitchen Lack of Transportation  (Non-Medical):   Physical Activity:   . Days of Exercise per Week:   . Minutes of Exercise per Session:   Stress:   . Feeling of Stress :   Social Connections:   . Frequency of Communication with Friends and Family:   . Frequency of Social Gatherings with Friends and Family:   . Attends Religious Services:   . Active Member of Clubs or Organizations:   . Attends Banker Meetings:   Marland Kitchen Marital Status:   Intimate Partner Violence:   . Fear of Current or Ex-Partner:   . Emotionally Abused:   Marland Kitchen Physically Abused:   . Sexually Abused:    Current Meds  Medication Sig  . aspirin 81 MG chewable tablet Chew 1 tablet (81 mg total) by mouth daily.  Marland Kitchen atorvastatin (LIPITOR) 80 MG tablet Take 1 tablet (80 mg total) by mouth daily at 6 PM.  . carvedilol (COREG) 12.5 MG tablet Take 1 tablet (12.5 mg total) by mouth 2 (two) times daily.  . empagliflozin (JARDIANCE) 25 MG TABS tablet Take 25 mg by mouth daily.  Marland Kitchen glipiZIDE (GLUCOTROL) 5 MG tablet Take 0.5 tablets (2.5 mg total) by mouth daily before breakfast.  . linagliptin (TRADJENTA) 5 MG TABS tablet Take 1 tablet (5 mg total) by mouth daily.  Marland Kitchen lisinopril (ZESTRIL) 5 MG tablet Take 1 tablet (5 mg total) by mouth daily.  . metFORMIN (GLUCOPHAGE) 1000 MG tablet Take 1 tablet (1,000 mg total) by mouth 2 (two) times daily with a meal.  . spironolactone (ALDACTONE) 25 MG tablet Take 0.5 tablets (12.5 mg total) by mouth daily. In am  . ticagrelor (BRILINTA) 60 MG TABS tablet Take 1 tablet (60 mg total) by mouth 2 (two) times daily.  . [DISCONTINUED] atorvastatin (LIPITOR) 80 MG tablet Take 1 tablet (80 mg total) by mouth daily at 6 PM.  . [DISCONTINUED] carvedilol (COREG) 12.5 MG tablet Take 1 tablet (12.5 mg total) by mouth 2 (two) times daily.  . [DISCONTINUED] glipiZIDE (GLUCOTROL) 5 MG tablet TAKE 0.5 TABLETS (2.5 MG TOTAL) BY MOUTH DAILY BEFORE BREAKFAST.  . [DISCONTINUED] JARDIANCE 25 MG TABS tablet TAKE 1 TABLET BY MOUTH EVERY DAY    . [DISCONTINUED] linagliptin (TRADJENTA) 5 MG TABS tablet Take 1 tablet (5 mg total) by mouth daily.  . [DISCONTINUED] lisinopril (PRINIVIL,ZESTRIL) 5 MG tablet Take 1 tablet (5 mg total) by mouth daily.  . [DISCONTINUED] metFORMIN (GLUCOPHAGE) 1000 MG tablet TAKE 1 TABLET (1,000 MG TOTAL) BY MOUTH 2 (TWO) TIMES DAILY WITH A MEAL.  . [DISCONTINUED] spironolactone (ALDACTONE) 25 MG tablet Take 0.5 tablets (12.5 mg total) by mouth daily.   No Known Allergies No results found for this or any previous visit (from the past  2160 hour(s)). Objective  Body mass index is 40.03 kg/m. Wt Readings from Last 3 Encounters:  12/11/19 287 lb (130.2 kg)  11/26/17 (!) 309 lb 12.8 oz (140.5 kg)  08/20/17 (!) 305 lb (138.3 kg)   Temp Readings from Last 3 Encounters:  12/11/19 98.5 F (36.9 C) (Temporal)  11/26/17 98 F (36.7 C) (Oral)  05/28/17 98.2 F (36.8 C) (Oral)   BP Readings from Last 3 Encounters:  12/11/19 (!) 170/90  11/26/17 120/86  08/20/17 (!) 150/72   Pulse Readings from Last 3 Encounters:  12/11/19 (!) 104  11/26/17 89  08/20/17 73    Physical Exam Vitals and nursing note reviewed.  Constitutional:      Appearance: Normal appearance. He is well-developed and well-groomed.  HENT:     Head: Normocephalic and atraumatic.  Eyes:     Conjunctiva/sclera: Conjunctivae normal.     Pupils: Pupils are equal, round, and reactive to light.  Cardiovascular:     Rate and Rhythm: Normal rate and regular rhythm.     Heart sounds: Normal heart sounds. No murmur.  Pulmonary:     Effort: Pulmonary effort is normal.     Breath sounds: Normal breath sounds.  Skin:    General: Skin is warm and dry.       Neurological:     General: No focal deficit present.     Mental Status: He is alert and oriented to person, place, and time. Mental status is at baseline.     Gait: Gait normal.  Psychiatric:        Attention and Perception: Attention and perception normal.        Mood and Affect:  Mood and affect normal.        Speech: Speech normal.        Behavior: Behavior normal. Behavior is cooperative.        Thought Content: Thought content normal.        Cognition and Memory: Cognition and memory normal.        Judgment: Judgment normal.     Assessment  Plan  Cellulitis of left upper extremity with open wound left arm- Plan: mupirocin ointment (BACTROBAN) 2 %, doxycycline (VIBRA-TABS) 100 MG tablet bid x 10 days Note for work today return 12/15/19  Type 2 diabetes mellitus with hyperglycemia, without long-term current use of insulin (HCC) - Plan: Urinalysis, Routine w reflex microscopic, Microalbumin / creatinine urine ratio, Lipid panel, Hemoglobin A1c, metFORMIN (GLUCOPHAGE) 1000 MG tablet, linagliptin (TRADJENTA) 5 MG TABS tablet, empagliflozin (JARDIANCE) 25 MG TABS tablet, glipiZIDE (GLUCOTROL) 5 MG tablet, atorvastatin (LIPITOR) 80 MG tablet F/u with PCP in 2 weeks after lab s  Essential hypertension - Plan: Comprehensive metabolic panel, Lipid panel, CBC with Differential/Platelet, spironolactone (ALDACTONE) 25 MG tablet, lisinopril (ZESTRIL) 5 MG tablet, carvedilol (COREG) 12.5 MG tablet, atorvastatin (LIPITOR) 80 MG tablet Start coreg and lis x 3 days add spironolactone   Coronary artery disease involving native coronary artery of native heart without angina pectoris - Plan: nitroGLYCERIN (NITROSTAT) 0.4 MG SL tablet, Ambulatory referral to Cardiology  Shortness of breath - Plan: DG Chest 2 View, Ambulatory referral to Cardiology Dr. Fletcher Anon Former smoker glad quit  Chronic systolic CHF (congestive heart failure) ([place]) - Plan: ECHOCARDIOGRAM COMPLETE, Ambulatory referral to Cardiology rec compliance with meds  Open wound  Given Tdap  Provider: Dr. Olivia Mackie McLean-Scocuzza-Internal Medicine

## 2019-12-11 NOTE — Progress Notes (Signed)
Patient is scheduled in the next 2 weeks to see the provider.  Michael Jordan,cma

## 2019-12-12 LAB — URINALYSIS, ROUTINE W REFLEX MICROSCOPIC
Bilirubin Urine: NEGATIVE
Hgb urine dipstick: NEGATIVE
Ketones, ur: NEGATIVE
Leukocytes,Ua: NEGATIVE
Nitrite: NEGATIVE
Protein, ur: NEGATIVE
Specific Gravity, Urine: 1.035 (ref 1.001–1.03)
pH: 6 (ref 5.0–8.0)

## 2019-12-12 LAB — MICROALBUMIN / CREATININE URINE RATIO
Creatinine, Urine: 17 mg/dL — ABNORMAL LOW (ref 20–320)
Microalb Creat Ratio: 24 mcg/mg creat (ref <30)
Microalb, Ur: 0.4 mg/dL

## 2019-12-12 NOTE — Addendum Note (Signed)
Addended by: Quentin Ore on: 12/12/2019 08:13 AM   Modules accepted: Orders

## 2019-12-15 ENCOUNTER — Ambulatory Visit (INDEPENDENT_AMBULATORY_CARE_PROVIDER_SITE_OTHER): Payer: BC Managed Care – PPO

## 2019-12-15 ENCOUNTER — Encounter: Payer: Self-pay | Admitting: Family

## 2019-12-15 ENCOUNTER — Other Ambulatory Visit: Payer: Self-pay

## 2019-12-15 ENCOUNTER — Ambulatory Visit (INDEPENDENT_AMBULATORY_CARE_PROVIDER_SITE_OTHER): Payer: BC Managed Care – PPO | Admitting: Family

## 2019-12-15 VITALS — BP 138/70 | HR 73 | Ht 71.0 in | Wt 286.0 lb

## 2019-12-15 DIAGNOSIS — E1159 Type 2 diabetes mellitus with other circulatory complications: Secondary | ICD-10-CM

## 2019-12-15 DIAGNOSIS — I5022 Chronic systolic (congestive) heart failure: Secondary | ICD-10-CM

## 2019-12-15 DIAGNOSIS — L03114 Cellulitis of left upper limb: Secondary | ICD-10-CM | POA: Diagnosis not present

## 2019-12-15 DIAGNOSIS — E785 Hyperlipidemia, unspecified: Secondary | ICD-10-CM

## 2019-12-15 DIAGNOSIS — I25118 Atherosclerotic heart disease of native coronary artery with other forms of angina pectoris: Secondary | ICD-10-CM

## 2019-12-15 DIAGNOSIS — I255 Ischemic cardiomyopathy: Secondary | ICD-10-CM

## 2019-12-15 DIAGNOSIS — E1165 Type 2 diabetes mellitus with hyperglycemia: Secondary | ICD-10-CM

## 2019-12-15 DIAGNOSIS — IMO0002 Reserved for concepts with insufficient information to code with codable children: Secondary | ICD-10-CM

## 2019-12-15 LAB — ECHOCARDIOGRAM COMPLETE
Height: 71 in
Weight: 4576 oz

## 2019-12-15 MED ORDER — CEPHALEXIN 500 MG PO CAPS: 500.0000 mg | ORAL_CAPSULE | Freq: Four times a day (QID) | ORAL | 0 refills | Status: DC

## 2019-12-15 NOTE — Patient Instructions (Addendum)
Medication Instructions:   Your physician has recommended you make the following change in your medication:   STOP Doxycycline  START Keflex 500 mg by mouth 4 times a day for 5 days.  *If you need a refill on your cardiac medications before your next appointment, please call your pharmacy*  Lab Work: No lab work today. We will plan to review your labs from Dr. Ellen Henri office on 12/26/19.  Anticipate we may have to recheck your cholesterol numbers after 6-8 weeks on the cholesterol medication.   Testing/Procedures: Your EKG today shows normal sinus rhythm with incomplete right bundle branch block which is a stable finding.    Follow-Up: At [facility], you and your health needs are our priority.  As part of our continuing mission to provide you with exceptional heart care, we have created designated Provider Care Teams.  These Care Teams include your primary Cardiologist (physician) and Advanced Practice Providers (APPs -  Physician Assistants and Nurse Practitioners) who all work together to provide you with the care you need, when you need it.  We recommend signing up for the patient portal called "MyChart".  Sign up information is provided on this After Visit Summary.  MyChart is used to connect with patients for Virtual Visits (Telemedicine).  Patients are able to view lab/test results, encounter notes, upcoming appointments, etc.  Non-urgent messages can be sent to your provider as well.   To learn more about what you can do with MyChart, go to [email or web address].    Your next appointment:   2 month(s)  The format for your next appointment:   In Person  Provider:   You may see Kathlyn Sacramento, MD or one of the following Advanced Practice Providers on your designated Care Team:    Murray Hodgkins, NP  Christell Faith, PA-C  Marrianne Mood, PA-C   Other Instructions   Call primary care Wednesday if cellulitis not improving  Cellulitis, Adult  Cellulitis is a  skin infection. The infected area is often warm, red, swollen, and sore. It occurs most often in the arms and lower legs. It is very important to get treated for this condition. What are the causes? This condition is caused by bacteria. The bacteria enter through a break in the skin, such as a cut, burn, insect bite, open sore, or crack. What increases the risk? This condition is more likely to occur in people who:  Have a weak body defense system (immune system).  Have open cuts, burns, bites, or scrapes on the skin.  Are older than 65 years of age.  Have a blood sugar problem (diabetes).  Have a long-lasting (chronic) liver disease (cirrhosis) or kidney disease.  Are very overweight (obese).  Have a skin problem, such as: ? Itchy rash (eczema). ? Slow movement of blood in the veins (venous stasis). ? Fluid buildup below the skin (edema).  Have been treated with high-energy rays (radiation).  Use IV drugs. What are the signs or symptoms? Symptoms of this condition include:  Skin that is: ? Red. ? Streaking. ? Spotting. ? Swollen. ? Sore or painful when you touch it. ? Warm.  A fever.  Chills.  Blisters. How is this diagnosed? This condition is diagnosed based on:  Medical history.  Physical exam.  Blood tests.  Imaging tests. How is this treated? Treatment for this condition may include:  Medicines to treat infections or allergies.  Home care, such as: ? Rest. ? Placing cold or warm cloths (compresses) on the skin.  Hospital care, if the condition is very bad. Follow these instructions at home: Medicines  Take over-the-counter and prescription medicines only as told by your doctor.  If you were prescribed an antibiotic medicine, take it as told by your doctor. Do not stop taking it even if you start to feel better. General instructions   Drink enough fluid to keep your pee (urine) pale yellow.  Do not touch or rub the infected area.  Raise  (elevate) the infected area above the level of your heart while you are sitting or lying down.  Place cold or warm cloths on the area as told by your doctor.  Keep all follow-up visits as told by your doctor. This is important. Contact a doctor if:  You have a fever.  You do not start to get better after 1-2 days of treatment.  Your bone or joint under the infected area starts to hurt after the skin has healed.  Your infection comes back. This can happen in the same area or another area.  You have a swollen bump in the area.  You have new symptoms.  You feel ill and have muscle aches and pains. Get help right away if:  Your symptoms get worse.  You feel very sleepy.  You throw up (vomit) or have watery poop (diarrhea) for a long time.  You see red streaks coming from the area.  Your red area gets larger.  Your red area turns dark in color. These symptoms may represent a serious problem that is an emergency. Do not wait to see if the symptoms will go away. Get medical help right away. Call your local emergency services (911 in the [place] Do not drive yourself to the hospital. Summary  Cellulitis is a skin infection. The area is often warm, red, swollen, and sore.  This condition is treated with medicines, rest, and cold and warm cloths.  Take all medicines only as told by your doctor.  Tell your doctor if symptoms do not start to get better after 1-2 days of treatment. This information is not intended to replace advice given to you by your health care provider. Make sure you discuss any questions you have with your health care provider. Document Revised: 11/29/2017 Document Reviewed: 11/29/2017 Elsevier Patient Education  2020 [employer].

## 2019-12-15 NOTE — Progress Notes (Signed)
Office Visit    Patient Name: Michael Jordan Date of Encounter: 12/15/2019  Primary Care Provider:  [name], MD Primary Cardiologist:  Lorine Bears, MD Electrophysiologist:  None   Chief Complaint    Michael Jordan is a 64 y.o. male with a hx of CAD s/p prior LAD stenting, ICM with EF 35-40%, HTN, HLD, obesity, DM2, history of tobacco abuse presents today for follow-up of CAD and ICM  Past Medical History    Past Medical History:  Diagnosis Date  . Abnormal stress test   . CAD (coronary artery disease)    a. 09/2015 Ant STEMI/PCI: LAD 100p (3.0x18 Xience Alpine DES), RCA 80p/158m (CTO)->fills via collats, EF 45-50%; b. 10/2015 MV: fixed septal/inf defects w/ dramatic ECG changes; c. 10/2015 Cath: LM nl, LAD patent stent, D1 40, SP1 40, LCX 4m, RCA 80p/131m, EF 45-50%-->Med Rx.  . Cardiomyopathy, ischemic    a. 09/2015 Echo: EF 35-40%, sev apical, periapical, antapical HK, mild to mod ant, inf HK, Gr1 DD.  Marland Kitchen History of tobacco abuse    a. Quit 05/2015.  Marland Kitchen Hyperlipidemia   . Morbid obesity (HCC)   . Tobacco abuse 10/01/2015  . Type II diabetes mellitus (HCC)    Past Surgical History:  Procedure Laterality Date  . CARDIAC CATHETERIZATION N/A 09/30/2015   Procedure: Left Heart Cath and Coronary Angiography;  Surgeon: Iran Ouch, MD;  Location: [facility];  Service: Cardiovascular;  Laterality: N/A;  . CARDIAC CATHETERIZATION N/A 09/30/2015   Procedure: Coronary Stent Intervention;  Surgeon: Iran Ouch, MD;  Location: [facility];  Service: Cardiovascular;  Laterality: N/A;  . CARDIAC CATHETERIZATION N/A 11/11/2015   Procedure: Left Heart Cath and Coronary Angiography;  Surgeon: Iran Ouch, MD;  Location: [facility];  Service: Cardiovascular;  Laterality: N/A;  . TONSILLECTOMY      Allergies  No Known Allergies  History of Present Illness    Michael Jordan is a 64 y.o. male with a hx of  CAD s/p prior LAD stenting, ICM with  EF 35-40%, HTN, HLD, obesity, DM2, history of tobacco abuse.  He was last seen January 2019 by Ward Givens, NP.  He had an anterior STEMI March 2017 with finding of a totally occluded proximal LAD as well as chronic total occlusion of mid/distal RCA.  LAD felt to be infarct vessel and was successfully treated with DES.  EF 35-40% by echo.  Subsequent stress testing April 2017 notable for significant ST changes with peak stress.  As result, relook catheterization was undertaken and showed patent LAD with otherwise stable anatomy.  Has been lost to follow up since January of 2019. Seen by primary care 12/11/19 for cellulitis of the LUE. Cellulitis noted after pulling brush and abrasion. He was given bactroban and doxycycline. Echo was ordered for follow up of HF as well as shortness of breath. Reported noncompliance with his medications and they were all resumed. CXR 12/11/19 with no acute findings.  Present today with his wife who works as a Patent examiner.  They are both very concerned as his cellulitis is not improving with antibiotics and the redness and swelling as well as his hand is not swollen.  He is keeping it elevated, taking doxycycline, using Bactroban as prescribed.  When asked why he was not taking his medications he tells me he feels fatigued when he takes his medications.  He is agreeable to trial taking them again.  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  Reports every once in a while he will get a twinge of chest pain that is fleeting and self resolves.  Has not had to take nitroglycerin.  Does not check blood pressure routinely at home but has a wrist cuff.  We discussed that these are often inaccurate and encouraged to purchase arm cuff.  His wife does check manual BP every once in a while.  Reports worsening dyspnea on exertion over the last month.  Echo was ordered by his primary care provider at recent clinic visit and we will  attempt to get it rescheduled sooner.  Reports no orthopnea, PND, lower extremity edema.  EKGs/Labs/Other Studies Reviewed:   The following studies were reviewed today:  EKG:  EKG is ordered today.  The ekg ordered today demonstrates NSR 73 bpm with incomplete RBBB stable compared to previous.  Recent Labs: No results found for requested labs within last 8760 hours.  Recent Lipid Panel    Component Value Date/Time   CHOL 202 (H) 06/04/2017 1018   TRIG 112.0 06/04/2017 1018   HDL 53.70 06/04/2017 1018   CHOLHDL 4 06/04/2017 1018   VLDL 22.4 06/04/2017 1018   LDLCALC 126 (H) 06/04/2017 1018   LDLDIRECT 117.0 11/26/2017 0821    Home Medications   Current Meds  Medication Sig  . aspirin 81 MG chewable tablet Chew 1 tablet (81 mg total) by mouth daily.  Marland Kitchen atorvastatin (LIPITOR) 80 MG tablet Take 1 tablet (80 mg total) by mouth daily at 6 PM.  . carvedilol (COREG) 12.5 MG tablet Take 1 tablet (12.5 mg total) by mouth 2 (two) times daily.  Marland Kitchen doxycycline (VIBRA-TABS) 100 MG tablet Take 1 tablet (100 mg total) by mouth 2 (two) times daily. With food x 10 days  . empagliflozin (JARDIANCE) 25 MG TABS tablet Take 25 mg by mouth daily.  Marland Kitchen glipiZIDE (GLUCOTROL) 5 MG tablet Take 0.5 tablets (2.5 mg total) by mouth daily before breakfast.  . linagliptin (TRADJENTA) 5 MG TABS tablet Take 1 tablet (5 mg total) by mouth daily.  Marland Kitchen lisinopril (ZESTRIL) 5 MG tablet Take 1 tablet (5 mg total) by mouth daily.  . metFORMIN (GLUCOPHAGE) 1000 MG tablet Take 1 tablet (1,000 mg total) by mouth 2 (two) times daily with a meal.  . mupirocin ointment (BACTROBAN) 2 % Place 1 application into the nose 3 (three) times daily. Left arm and nose with Q tip do not contaminate tube  . nitroGLYCERIN (NITROSTAT) 0.4 MG SL tablet Place 1 tablet (0.4 mg total) under the tongue every 5 (five) minutes as needed for chest pain.  Marland Kitchen spironolactone (ALDACTONE) 25 MG tablet Take 0.5 tablets (12.5 mg total) by mouth daily. In am    . ticagrelor (BRILINTA) 60 MG TABS tablet Take 1 tablet (60 mg total) by mouth 2 (two) times daily.    Review of Systems    Review of Systems  Constitution: Negative for chills, fever and malaise/fatigue.  Cardiovascular: Negative for chest pain, dyspnea on exertion, leg swelling, near-syncope, orthopnea, palpitations and syncope.  Respiratory: Negative for cough, shortness of breath and wheezing.   Skin: Positive for color change (LUE red and swollen).  Gastrointestinal: Negative for nausea and vomiting.  Neurological: Negative for dizziness, light-headedness and weakness.   All other systems reviewed and are otherwise negative except as noted above.  Physical Exam    VS:  BP 138/70 (BP Location: Left Arm, Patient Position: Sitting,  Cuff Size: Large)   Pulse 73   Ht 5\' 11"  (1.803 m)   Wt 286 lb (129.7 kg)   SpO2 97%   BMI 39.89 kg/m  , BMI Body mass index is 39.89 kg/m. GEN: Well nourished, overweight, well developed, in no acute distress. HEENT: normal. Neck: Supple, no JVD, carotid bruits, or masses. Cardiac: RRR, no murmurs, rubs, or gallops. No clubbing, cyanosis, edema.  Radials/PT 2+ and equal bilaterally.  Respiratory:  Respirations regular and unlabored, clear to auscultation bilaterally. GI: Soft, nontender, nondistended MS: No deformity or atrophy. Skin: Warm and dry. LUE with 1+ pitting edema from hand to mid forearm. Erythema noted and warm to touch. No exudate noted.  Neuro:  Strength and sensation are intact. Psych: Normal affect.  Assessment & Plan    1. LUE cellulitis -seen by primary care 12/11/2019 with LUE cellulitis.  Reports no fevers, chills at home.  He was started on doxycycline and has taken for 4 days.  Reports redness and swelling of the arm are getting worse.  Palpable 2+ radial pulse with appropriate capillary refill.  1+ pitting edema from hand to mid forearm.  Stop doxycycline as cellulitis is not responding, start Keflex 500 mg 4 times per day.   He was provided a note to stay out of work through Wednesday, 12/17/2019.  If arms not improving by Wednesday morning he was instructed to call his primary care provider. No LUE duplex ordered today, but may be considered if does not improve with transition of abx.  2. CAD -s/p prior anterior STEMI with LAD DES placement March 2017.  Stable anatomy on catheterization April 2017.  Reports no chest pain.  No indication for ischemic evaluation at this time, will re-assess after echo this morning. EKG today with no acute ST/T wave changes.  Recently resumed all of his medications including GDMT  aspirin, beta-blocker, statin.  Recommend low-sodium, heart healthy diet.  Recommend regular cardiovascular exercise.  3. HTN - BP reasonably well controlled today.  Recently resumed Coreg 12.5 mg twice daily, lisinopril 5 mg daily, spironolactone 12.5 mg daily at recent PCP visit.  Anticipate blood work in 2 weeks at primary care appointment.  Recommended to keep home BP log and report blood pressure consistently greater than 130/80.  4. HLD - resumed atorvastatin 80 mg daily 2 weeks ago when seen by primary care.  Plan for repeat lipid/liver in 6 to 8 weeks.  If not at goal of less than 70, consider addition of Zetia 10 mg daily.  5. Ischemic cardiomyopathy/chronic systolic congestive heart failure - Echo 09/2015 with LVEF 35-40%.  On relook catheterization 10/2015 EF 45-50% by left ventriculography. Report worsening DOE over the last month. Echo ordered by primary care at recent visit, as we have availability in our office today scheduled for 10:30 AM echo today, order placed. GDMT includes ACE, MRA, Coreg. No indication for loop diuretic at this time. Pending result of echo may benefit from referral to cardiac rehab.   6. DM2 -recently resumed all of his diabetes medications.  Has upcoming follow-up with his PCP.  Discussed importance of optimal control of diabetes in the setting of heart disease.  Appreciate  inclusion of Jardiance for cardioprotective benefit.  7. Morbid obesity -weight loss via diet and exercise encouraged.  Disposition: Follow up in 2 month(s) with Dr. Fletcher Anon or APP  Loel Dubonnet, NP 12/15/2019, 10:16 AM

## 2019-12-17 ENCOUNTER — Telehealth (INDEPENDENT_AMBULATORY_CARE_PROVIDER_SITE_OTHER): Payer: BC Managed Care – PPO | Admitting: Family Medicine

## 2019-12-17 ENCOUNTER — Other Ambulatory Visit: Payer: Self-pay

## 2019-12-17 DIAGNOSIS — L03114 Cellulitis of left upper limb: Secondary | ICD-10-CM | POA: Diagnosis not present

## 2019-12-17 MED ORDER — CEPHALEXIN 500 MG PO CAPS: 500.0000 mg | ORAL_CAPSULE | Freq: Four times a day (QID) | ORAL | 0 refills | Status: AC

## 2019-12-17 MED ORDER — DOXYCYCLINE HYCLATE 100 MG PO TABS: 100.0000 mg | ORAL_TABLET | Freq: Two times a day (BID) | ORAL | 0 refills | Status: DC

## 2019-12-17 NOTE — Assessment & Plan Note (Signed)
Patient with purulent cellulitis and abscess.  The abscess appears to be draining.  I discussed that this was a good thing.  Discussed given the purulent nature he should be taking the doxycycline and Keflex.  They still have about 6 days left of doxycycline and I advised that they resume that and I sent additional doxycycline to complete a continuous 10-day course.  I also sent an additional Keflex to complete a 10-day course of that.  I discussed having him follow-up in the office in 2 days for recheck though the patient notes he is not able to do that given his work schedule.  They will contact us via phone to update [place] on how he is doing.  I did advise that if he has any worsening swelling, redness, pain, or drainage he should be evaluated in the emergency department to consider IV antibiotics.  They verbalized understanding of this.

## 2019-12-17 NOTE — Progress Notes (Signed)
Virtual Visit via telephone Note  This visit type was conducted due to national recommendations for restrictions regarding the COVID-19 pandemic (e.g. social distancing).  This format is felt to be most appropriate for this patient at this time.  All issues noted in this document were discussed and addressed.  No physical exam was performed (except for noted visual exam findings with Video Visits).   I connected with Carl Best today at  2:45 PM EDT by a video enabled telemedicine application or telephone and verified that I am speaking with the correct person using two identifiers. Location patient: home Location provider: work  Persons participating in the virtual visit: patient, provider, sheila Minervini (wife)  I discussed the limitations, risks, security and privacy concerns of performing an evaluation and management service by telephone and the availability of in person appointments. I also discussed with the patient that there may be a patient responsible charge related to this service. The patient expressed understanding and agreed to proceed.  Interactive audio and video telecommunications were attempted between this provider and patient, however failed, due to patient having technical difficulties OR patient did not have access to video capability.  We continued and completed visit with audio only.   Reason for visit: f/u.  HPI: Cellulitis: Patient's wife provides most of the history.  She is a Engineer, civil (consulting).  Patient was seen last week by Dr. Judie Grieve for a left upper extremity cellulitis.  He was started on doxycycline.  They noted that his symptoms were not improving when he saw his cardiology office earlier this week and they switched him over to Keflex and had him discontinue his doxycycline.  His wife notes the swelling and redness has improved slightly though he did develop a blister that burst earlier today and has been draining purulent material.  She notes the blister was about  10 cm x 10 cm.  He has not had any fevers or nausea or vomiting.  He has been taking Excedrin Migraine for the discomfort.   ROS: See pertinent positives and negatives per HPI.  Past Medical History:  Diagnosis Date  . Abnormal stress test   . CAD (coronary artery disease)    a. 09/2015 Ant STEMI/PCI: LAD 100p (3.0x18 Xience Alpine DES), RCA 80p/118m (CTO)->fills via collats, EF 45-50%; b. 10/2015 MV: fixed septal/inf defects w/ dramatic ECG changes; c. 10/2015 Cath: LM nl, LAD patent stent, D1 40, SP1 40, LCX 20m, RCA 80p/117m, EF 45-50%-->Med Rx.  . Cardiomyopathy, ischemic    a. 09/2015 Echo: EF 35-40%, sev apical, periapical, antapical HK, mild to mod ant, inf HK, Gr1 DD.  Marland Kitchen History of tobacco abuse    a. Quit 05/2015.  Marland Kitchen Hyperlipidemia   . Morbid obesity (HCC)   . Tobacco abuse 10/01/2015  . Type II diabetes mellitus (HCC)     Past Surgical History:  Procedure Laterality Date  . CARDIAC CATHETERIZATION N/A 09/30/2015   Procedure: Left Heart Cath and Coronary Angiography;  Surgeon: Iran Ouch, MD;  Location: [facility];  Service: Cardiovascular;  Laterality: N/A;  . CARDIAC CATHETERIZATION N/A 09/30/2015   Procedure: Coronary Stent Intervention;  Surgeon: Iran Ouch, MD;  Location: [facility];  Service: Cardiovascular;  Laterality: N/A;  . CARDIAC CATHETERIZATION N/A 11/11/2015   Procedure: Left Heart Cath and Coronary Angiography;  Surgeon: Iran Ouch, MD;  Location: [facility];  Service: Cardiovascular;  Laterality: N/A;  . TONSILLECTOMY      Family History  Problem Relation  Age of Onset  . Diabetes Mother   . Heart attack Mother   . Heart attack Father   . Heart attack Paternal Grandfather     SOCIAL HX: Former smoker   Current Outpatient Medications:  .  aspirin 81 MG chewable tablet, Chew 1 tablet (81 mg total) by mouth daily., Disp: , Rfl:  .  atorvastatin (LIPITOR) 80 MG tablet, Take 1 tablet (80 mg total) by mouth daily at  6 PM., Disp: 30 tablet, Rfl: 0 .  carvedilol (COREG) 12.5 MG tablet, Take 1 tablet (12.5 mg total) by mouth 2 (two) times daily., Disp: 60 tablet, Rfl: 0 .  cephALEXin (KEFLEX) 500 MG capsule, Take 1 capsule (500 mg total) by mouth 4 (four) times daily for 5 days., Disp: 20 capsule, Rfl: 0 .  doxycycline (VIBRA-TABS) 100 MG tablet, Take 1 tablet (100 mg total) by mouth 2 (two) times daily., Disp: 8 tablet, Rfl: 0 .  empagliflozin (JARDIANCE) 25 MG TABS tablet, Take 25 mg by mouth daily., Disp: 30 tablet, Rfl: 0 .  glipiZIDE (GLUCOTROL) 5 MG tablet, Take 0.5 tablets (2.5 mg total) by mouth daily before breakfast., Disp: 30 tablet, Rfl: 0 .  linagliptin (TRADJENTA) 5 MG TABS tablet, Take 1 tablet (5 mg total) by mouth daily., Disp: 30 tablet, Rfl: 0 .  lisinopril (ZESTRIL) 5 MG tablet, Take 1 tablet (5 mg total) by mouth daily., Disp: 30 tablet, Rfl: 0 .  metFORMIN (GLUCOPHAGE) 1000 MG tablet, Take 1 tablet (1,000 mg total) by mouth 2 (two) times daily with a meal., Disp: 60 tablet, Rfl: 0 .  mupirocin ointment (BACTROBAN) 2 %, Place 1 application into the nose 3 (three) times daily. Left arm and nose with Q tip do not contaminate tube, Disp: 60 g, Rfl: 0 .  nitroGLYCERIN (NITROSTAT) 0.4 MG SL tablet, Place 1 tablet (0.4 mg total) under the tongue every 5 (five) minutes as needed for chest pain., Disp: 25 tablet, Rfl: 0 .  spironolactone (ALDACTONE) 25 MG tablet, Take 0.5 tablets (12.5 mg total) by mouth daily. In am, Disp: 30 tablet, Rfl: 0 .  ticagrelor (BRILINTA) 60 MG TABS tablet, Take 1 tablet (60 mg total) by mouth 2 (two) times daily., Disp: 180 tablet, Rfl: 2  EXAM: This was a telephone visit and thus no physical exam was completed.  ASSESSMENT AND PLAN:  Discussed the following assessment and plan:  Cellulitis of left upper extremity Patient with purulent cellulitis and abscess.  The abscess appears to be draining.  I discussed that this was a good thing.  Discussed given the purulent  nature he should be taking the doxycycline and Keflex.  They still have about 6 days left of doxycycline and I advised that they resume that and I sent additional doxycycline to complete a continuous 10-day course.  I also sent an additional Keflex to complete a 10-day course of that.  I discussed having him follow-up in the office in 2 days for recheck though the patient notes he is not able to do that given his work schedule.  They will contact us via phone to update [place] on how he is doing.  I did advise that if he has any worsening swelling, redness, pain, or drainage he should be evaluated in the emergency department to consider IV antibiotics.  They verbalized understanding of this.   No orders of the defined types were placed in this encounter.   Meds ordered this encounter  Medications  . doxycycline (VIBRA-TABS) 100 MG tablet  Sig: Take 1 tablet (100 mg total) by mouth 2 (two) times daily.    Dispense:  8 tablet    Refill:  0  . cephALEXin (KEFLEX) 500 MG capsule    Sig: Take 1 capsule (500 mg total) by mouth 4 (four) times daily for 5 days.    Dispense:  20 capsule    Refill:  0     I discussed the assessment and treatment plan with the patient. The patient was provided an opportunity to ask questions and all were answered. The patient agreed with the plan and demonstrated an understanding of the instructions.   The patient was advised to call back or seek an in-person evaluation if the symptoms worsen or if the condition fails to improve as anticipated.  I provided 13 minutes of non-face-to-face time during this encounter.   Tommi Rumps, MD

## 2019-12-17 NOTE — Progress Notes (Signed)
A letter was created and faxed to the appeals department at 1- [phone] the prescription claims appeals department  For [facility]. I faxed the letter explaining why the medication should be approved.  Javarius Tsosie,cma

## 2019-12-25 ENCOUNTER — Other Ambulatory Visit: Payer: Self-pay

## 2019-12-26 ENCOUNTER — Other Ambulatory Visit: Payer: Self-pay

## 2019-12-26 ENCOUNTER — Ambulatory Visit (INDEPENDENT_AMBULATORY_CARE_PROVIDER_SITE_OTHER): Payer: BC Managed Care – PPO | Admitting: Family Medicine

## 2019-12-26 ENCOUNTER — Other Ambulatory Visit: Payer: BC Managed Care – PPO

## 2019-12-26 ENCOUNTER — Encounter: Payer: Self-pay | Admitting: Family Medicine

## 2019-12-26 VITALS — BP 120/70 | HR 77 | Temp 95.3°F | Ht 71.0 in | Wt 282.8 lb

## 2019-12-26 DIAGNOSIS — L03114 Cellulitis of left upper limb: Secondary | ICD-10-CM | POA: Diagnosis not present

## 2019-12-26 DIAGNOSIS — Z125 Encounter for screening for malignant neoplasm of prostate: Secondary | ICD-10-CM

## 2019-12-26 DIAGNOSIS — I10 Essential (primary) hypertension: Secondary | ICD-10-CM | POA: Diagnosis not present

## 2019-12-26 DIAGNOSIS — L851 Acquired keratosis [keratoderma] palmaris et plantaris: Secondary | ICD-10-CM | POA: Insufficient documentation

## 2019-12-26 DIAGNOSIS — E1165 Type 2 diabetes mellitus with hyperglycemia: Secondary | ICD-10-CM

## 2019-12-26 DIAGNOSIS — E1159 Type 2 diabetes mellitus with other circulatory complications: Secondary | ICD-10-CM

## 2019-12-26 DIAGNOSIS — E785 Hyperlipidemia, unspecified: Secondary | ICD-10-CM | POA: Diagnosis not present

## 2019-12-26 DIAGNOSIS — IMO0002 Reserved for concepts with insufficient information to code with codable children: Secondary | ICD-10-CM

## 2019-12-26 LAB — LIPID PANEL
Cholesterol: 161 mg/dL (ref 0–200)
HDL: 45.2 mg/dL (ref >39.00)
LDL Cholesterol: 86 mg/dL (ref 0–99)
NonHDL: 115.48
Total CHOL/HDL Ratio: 4
Triglycerides: 146 mg/dL (ref 0.0–149.0)
VLDL: 29.2 mg/dL (ref 0.0–40.0)

## 2019-12-26 LAB — COMPREHENSIVE METABOLIC PANEL
ALT: 15 U/L (ref 0–53)
AST: 23 U/L (ref 0–37)
Albumin: 3.9 g/dL (ref 3.5–5.2)
Alkaline Phosphatase: 88 U/L (ref 39–117)
BUN: 16 mg/dL (ref 6–23)
CO2: 26 mEq/L (ref 19–32)
Calcium: 9 mg/dL (ref 8.4–10.5)
Chloride: 99 mEq/L (ref 96–112)
Creatinine, Ser: 0.83 mg/dL (ref 0.40–1.50)
GFR: 92.95 mL/min (ref >60.00)
Glucose, Bld: 274 mg/dL — ABNORMAL HIGH (ref 70–99)
Potassium: 5.1 mEq/L (ref 3.5–5.1)
Sodium: 134 mEq/L — ABNORMAL LOW (ref 135–145)
Total Bilirubin: 0.4 mg/dL (ref 0.2–1.2)
Total Protein: 6.5 g/dL (ref 6.0–8.3)

## 2019-12-26 LAB — CBC WITH DIFFERENTIAL/PLATELET
Basophils Absolute: 0.1 10*3/uL (ref 0.0–0.1)
Basophils Relative: 1.1 % (ref 0.0–3.0)
Eosinophils Absolute: 0.2 10*3/uL (ref 0.0–0.7)
Eosinophils Relative: 1.9 % (ref 0.0–5.0)
HCT: 41.9 % (ref 39.0–52.0)
Hemoglobin: 14 g/dL (ref 13.0–17.0)
Lymphocytes Relative: 24.9 % (ref 12.0–46.0)
Lymphs Abs: 2.4 10*3/uL (ref 0.7–4.0)
MCHC: 33.4 g/dL (ref 30.0–36.0)
MCV: 86.5 fl (ref 78.0–100.0)
Monocytes Absolute: 0.6 10*3/uL (ref 0.1–1.0)
Monocytes Relative: 6.4 % (ref 3.0–12.0)
Neutro Abs: 6.3 10*3/uL (ref 1.4–7.7)
Neutrophils Relative %: 65.7 % (ref 43.0–77.0)
Platelets: 391 10*3/uL (ref 150.0–400.0)
RBC: 4.84 Mil/uL (ref 4.22–5.81)
RDW: 14.1 % (ref 11.5–15.5)
WBC: 9.5 10*3/uL (ref 4.0–10.5)

## 2019-12-26 LAB — PSA: PSA: 0.49 ng/mL (ref 0.10–4.00)

## 2019-12-26 LAB — HEMOGLOBIN A1C: Hgb A1c MFr Bld: 14.5 % — ABNORMAL HIGH (ref 4.6–6.5)

## 2019-12-26 MED ORDER — DOXYCYCLINE HYCLATE 100 MG PO TABS: 100.0000 mg | ORAL_TABLET | Freq: Two times a day (BID) | ORAL | 0 refills | Status: DC

## 2019-12-26 MED ORDER — BLOOD GLUCOSE MONITOR KIT: PACK | 0 refills | Status: AC

## 2019-12-26 MED ORDER — CEPHALEXIN 500 MG PO CAPS: 500.0000 mg | ORAL_CAPSULE | Freq: Four times a day (QID) | ORAL | 0 refills | Status: DC

## 2019-12-26 NOTE — Progress Notes (Signed)
Michael Rumps, MD Phone: [phone]  Michael Jordan is a 64 y.o. male who presents today for f/u.  HYPERTENSION  Disease Monitoring  Home BP Monitoring checks occasionally Chest pain- no    Dyspnea- only with extreme exertion, negative CXR recently, echo through cardiology recently Medications  Compliance-  Taking coreg, lisinopril, spironolactone.   Edema- no  DIABETES Disease Monitoring: Blood Sugar ranges-not checking Polyuria/phagia/dipsia- uria and dipsia      Optho- due Medications: Compliance- taking jardiance, glipizide, tradjenta, metformin Hypoglycemic symptoms- no  Left upper extremity cellulitis: Patient notes this is 90% better.  He still has a couple of days left of doxycycline.  He completed a 10-day course of Keflex.  He notes it has not drained anything in the last 2 days.  There is some associated itching.  The erythema has improved significantly.  They report previously it was up to his mid upper arm down to his hand.  Now it just includes the area right around his elbow.  There is no tenderness now.  No fevers.     Social History   Tobacco Use  Smoking Status Former Smoker  . Packs/day: 0.50  . Years: 44.00  . Pack years: 22.00  . Types: Cigarettes  . Quit date: 06/02/2015  . Years since quitting: 4.5  Smokeless Tobacco Never Used     ROS see history of present illness  Objective  Physical Exam Vitals:   12/26/19 0804  BP: 120/70  Pulse: 77  Temp: (!) 95.3 F (35.2 C)  SpO2: 96%    BP Readings from Last 3 Encounters:  12/26/19 120/70  12/15/19 138/70  12/11/19 (!) 170/90   Wt Readings from Last 3 Encounters:  12/26/19 282 lb 12.8 oz (128.3 kg)  12/15/19 286 lb (129.7 kg)  12/11/19 287 lb (130.2 kg)    Physical Exam Constitutional:      General: He is not in acute distress.    Appearance: He is not diaphoretic.  Cardiovascular:     Rate and Rhythm: Normal rate and regular rhythm.     Heart sounds: Normal heart sounds.    Pulmonary:     Effort: Pulmonary effort is normal.     Breath sounds: Normal breath sounds.  Musculoskeletal:     Comments: Mild erythema and induration around his distal left elbow into his forearm, there is no tenderness, there is minimal warmth, there is no fluctuance, there are 2 scabs noted in these areas  Skin:    General: Skin is warm and dry.     Comments: Scattered stucco keratoses on his feet and legs  Neurological:     Mental Status: He is alert.    Diabetic Foot Exam - Simple   Simple Foot Form Diabetic Foot exam was performed with the following findings: Yes 12/26/2019  8:30 AM  Visual Inspection See comments: Yes Sensation Testing Intact to touch and monofilament testing bilaterally: Yes Pulse Check Posterior Tibialis and Dorsalis pulse intact bilaterally: Yes Comments Onychomycosis noted bilaterally, stucco keratoses bilateral feet       Assessment/Plan: Please see individual problem list.  Uncontrolled type 2 diabetes mellitus with circulatory disorder (HCC) Check A1c.  Continue current medication.  We will start checking her sugars at home.  Essential hypertension Well-controlled today.  Continue current medication.  Cellulitis of left upper extremity They report significant improvement.  We will extend his course of antibiotics by 4 days for total of 14 days for both antibiotics.  Discussed if it is not resolved at  that time he needs to go the emergency room to consider IV antibiotics.  If it worsens he will also seek medical attention in the ED.  Stucco keratoses Discussed that these are benign. Advised to monitor.    Health Maintenance: encouraged to see optho for yearly eye exam.   Orders Placed This Encounter  Procedures  . HgB A1c  . CBC w/Diff  . Lipid panel  . Comp Met (CMET)  . PSA    Meds ordered this encounter  Medications  . doxycycline (VIBRA-TABS) 100 MG tablet    Sig: Take 1 tablet (100 mg total) by mouth 2 (two) times daily.     Dispense:  8 tablet    Refill:  0  . cephALEXin (KEFLEX) 500 MG capsule    Sig: Take 1 capsule (500 mg total) by mouth 4 (four) times daily.    Dispense:  16 capsule    Refill:  0  . blood glucose meter kit and supplies KIT    Sig: Dispense based on patient and insurance preference. Use once daily as directed. (FOR ICD-10 E11.9).    Dispense:  1 each    Refill:  0    Order Specific Question:   Number of strips    Answer:   100    Order Specific Question:   Number of lancets    Answer:   100    This visit occurred during the SARS-CoV-2 public health emergency.  Safety protocols were in place, including screening questions prior to the visit, additional usage of staff PPE, and extensive cleaning of exam room while observing appropriate contact time as indicated for disinfecting solutions.    Michael Rumps, MD [place]

## 2019-12-26 NOTE — Assessment & Plan Note (Signed)
Discussed that these are benign. Advised to monitor.

## 2019-12-26 NOTE — Patient Instructions (Signed)
Nice to see you. Please complete 4 more days of antibiotics.  This will equal 14 days total.  If the area has not resolved by then or if he has any worsening you need to go to the emergency room to consider IV antibiotics.

## 2019-12-26 NOTE — Assessment & Plan Note (Signed)
They report significant improvement.  We will extend his course of antibiotics by 4 days for total of 14 days for both antibiotics.  Discussed if it is not resolved at that time he needs to go the emergency room to consider IV antibiotics.  If it worsens he will also seek medical attention in the ED.

## 2019-12-26 NOTE — Assessment & Plan Note (Signed)
Check A1c.  Continue current medication.  We will start checking her sugars at home.

## 2019-12-26 NOTE — Assessment & Plan Note (Signed)
Well-controlled today.  Continue current medication. 

## 2019-12-31 ENCOUNTER — Telehealth: Payer: Self-pay | Admitting: Family Medicine

## 2019-12-31 ENCOUNTER — Telehealth: Payer: Self-pay

## 2019-12-31 NOTE — Telephone Encounter (Signed)
-----   Message from [name], MD sent at 12/30/2019  2:06 PM EDT ----- Please let the patient know that his A1c is very uncontrolled.  I would like for him to see our clinical pharmacist to discuss management options.  I would also like to start him on a medicine called Ozempic.  This would take the place of his Tradjenta and may provide more benefit.  It is a once weekly injection.  If he is okay starting on this please see if he has a personal or family history of thyroid cancer, parathyroid cancer, or adrenal gland cancer.  We may also need to consider starting him on insulin.  His LDL cholesterol is not quite at goal.  Would he be willing to do an injection for his cholesterol that he would take every 2 weeks?

## 2019-12-31 NOTE — Telephone Encounter (Signed)
Pt returned call for results.

## 2020-01-01 ENCOUNTER — Other Ambulatory Visit: Payer: Self-pay

## 2020-01-01 ENCOUNTER — Other Ambulatory Visit: Payer: Self-pay | Admitting: Family Medicine

## 2020-01-01 DIAGNOSIS — E1159 Type 2 diabetes mellitus with other circulatory complications: Secondary | ICD-10-CM

## 2020-01-01 DIAGNOSIS — IMO0002 Reserved for concepts with insufficient information to code with codable children: Secondary | ICD-10-CM

## 2020-01-01 DIAGNOSIS — Z955 Presence of coronary angioplasty implant and graft: Secondary | ICD-10-CM

## 2020-01-01 DIAGNOSIS — E1165 Type 2 diabetes mellitus with hyperglycemia: Secondary | ICD-10-CM

## 2020-01-01 DIAGNOSIS — E785 Hyperlipidemia, unspecified: Secondary | ICD-10-CM

## 2020-01-01 DIAGNOSIS — I252 Old myocardial infarction: Secondary | ICD-10-CM

## 2020-01-01 DIAGNOSIS — I25118 Atherosclerotic heart disease of native coronary artery with other forms of angina pectoris: Secondary | ICD-10-CM

## 2020-01-01 MED ORDER — OZEMPIC (0.25 OR 0.5 MG/DOSE) 2 MG/1.5ML ~~LOC~~ SOPN: PEN_INJECTOR | SUBCUTANEOUS | 1 refills | Status: DC

## 2020-01-01 MED ORDER — EMPAGLIFLOZIN 25 MG PO TABS: 25.0000 mg | ORAL_TABLET | Freq: Every day | ORAL | 0 refills | Status: DC

## 2020-01-01 MED ORDER — REPATHA SURECLICK 140 MG/ML ~~LOC~~ SOAJ: 140.0000 mg | SUBCUTANEOUS | 1 refills | Status: DC

## 2020-01-01 NOTE — Progress Notes (Signed)
Sent a refill for Jardiance to pharmacy.   Madeleine Fenn,cma

## 2020-01-02 ENCOUNTER — Telehealth: Payer: Self-pay | Admitting: Family Medicine

## 2020-01-02 NOTE — Chronic Care Management (AMB) (Signed)
  Care Management   Outreach Note  01/02/2020 Name: Michael Jordan MRN: [id number] DOB: [birth year]  Referred by: [name], MD Reason for referral : Care Management (CM Initial outreach unsuccessful)   An unsuccessful telephone outreach was attempted today. The patient was referred to the case management team for assistance with care management and care coordination.   Follow Up Plan: A HIPPA compliant phone message was left for the patient providing contact information and requesting a return call.  The care management team will reach out to the patient again over the next 7 days.  If patient returns call to provider office, please advise to call Embedded Care Management Care Guide Elisha Ponder LPN at [phone]  Elisha Ponder, LPN Health Advisor, Embedded Care Coordination [facility] Care Management ??[email or web address] ??[phone]

## 2020-01-02 NOTE — Telephone Encounter (Signed)
Results given.  Martyna Thorns,cma  

## 2020-01-05 ENCOUNTER — Other Ambulatory Visit: Payer: Self-pay

## 2020-01-05 DIAGNOSIS — E1165 Type 2 diabetes mellitus with hyperglycemia: Secondary | ICD-10-CM

## 2020-01-05 DIAGNOSIS — I10 Essential (primary) hypertension: Secondary | ICD-10-CM

## 2020-01-05 MED ORDER — LISINOPRIL 5 MG PO TABS: 5.0000 mg | ORAL_TABLET | Freq: Every day | ORAL | 0 refills | Status: DC

## 2020-01-05 MED ORDER — ATORVASTATIN CALCIUM 80 MG PO TABS: 80.0000 mg | ORAL_TABLET | Freq: Every day | ORAL | 0 refills | Status: DC

## 2020-01-05 MED ORDER — GLIPIZIDE 5 MG PO TABS: 2.5000 mg | ORAL_TABLET | Freq: Every day | ORAL | 0 refills | Status: DC

## 2020-01-05 MED ORDER — SPIRONOLACTONE 25 MG PO TABS: 12.5000 mg | ORAL_TABLET | Freq: Every day | ORAL | 0 refills | Status: DC

## 2020-01-05 MED ORDER — METFORMIN HCL 1000 MG PO TABS: 1000.0000 mg | ORAL_TABLET | Freq: Two times a day (BID) | ORAL | 0 refills | Status: DC

## 2020-01-05 MED ORDER — CARVEDILOL 12.5 MG PO TABS: 12.5000 mg | ORAL_TABLET | Freq: Two times a day (BID) | ORAL | 0 refills | Status: DC

## 2020-01-05 NOTE — Progress Notes (Unsigned)
Patient requesting theses medication, ordered by Dr. Isidore Moos when he saw her last month.   Last OV: 12/26/2019 Future OV: 03/24/2020.  Akshay Spang,cma

## 2020-01-05 NOTE — Chronic Care Management (AMB) (Signed)
  Care Management   Note  01/05/2020 Name: Michael Jordan MRN: [id number] DOB: 09/30/1955  Michael Jordan is a 64 y.o. year old male who is a primary care patient of Birdie Sons, Yehuda Mao, MD. I reached out to Michael Jordan by phone today in response to a referral sent by Mr. Peggyann Juba health plan.    Mr. Can was given information about care management services today including:  1. Care management services include personalized support from designated clinical staff supervised by his physician, including individualized plan of care and coordination with other care providers 2. 24/7 contact phone numbers for assistance for urgent and routine care needs. 3. The patient may stop care management services at any time by phone call to the office staff.  Patient's spouse stated that patient would not agree to enrollment in care management services and does not wish to consider at this time.  Follow up plan: The care management team is available to follow up with the patient after provider conversation with the patient regarding recommendation for care management engagement and subsequent re-referral to the care management team.   Elisha Ponder, LPN Health Advisor, Embedded Care Coordination [facility] Health Care Management ??[email or web address] ??[phone]

## 2020-01-07 ENCOUNTER — Telehealth: Payer: Self-pay

## 2020-01-07 DIAGNOSIS — E1165 Type 2 diabetes mellitus with hyperglycemia: Secondary | ICD-10-CM

## 2020-01-07 MED ORDER — GLUCOSE BLOOD VI STRP: ORAL_STRIP | 1 refills | Status: DC

## 2020-01-08 NOTE — Telephone Encounter (Signed)
Michael Jordan, [facility]  01/08/2020 9:32 AM EDT Back to Top    Called patient to schedule for repeat labs. Spoke to patients wife Michael Jordan. Michael Jordan is at work and is unable to speak. Patients wife was generally frustrated that more information about the medication wasn't presented and feels like medication trial questions should be addressed in office and not through a phone call. Michael Jordan states that [name] does not understand the medication or what he is agreeing to and he needs more information. Michael Jordan asks about the injection for Cholesterol and if it is Repatha? She would like to know if it is a IM injection and the location for the injection. Scheduled labs for 8:15 am on 02/23/2020. Michael Jordan states she will call back after discussing with [name] to schedule an appointment to discuss the medication with PCP.   [name], CMA  01/01/2020 1:09 PM EDT     Can you please call this patient and schedule a lab appointment in 6 weeks. I called and spoke with him earlier but did not know about the labs.  Thanks nina,cma    [name], MD  01/01/2020 12:56 PM EDT     Ordered. Patient needs follow-up labs in 6 weeks. Order placed.    [name], CMA  01/01/2020 8:48 AM EDT     I called and spoke with the patient and he is okay with starting the Ozempic for diabetes and he is willing to start the cholesterol injection but he does not want the insulin. Patient has no personal history or family history of thyroid, parathyroid or adrenal gland cancer. Patient is also willing to see the pharmacist. [name], [facility]  12/31/2019 4:32 PM EDT     I called and LVM for the patient to call back for lab results. [name],cma    [name], MD  12/30/2019 2:06 PM EDT     Please let the patient know that his A1c is very uncontrolled. I would like for him to see our clinical pharmacist to discuss management options. I would also like to start him on a medicine called Ozempic. This  would take the place of his Tradjenta and may provide more benefit. It is a once weekly injection. If he is okay starting on this please see if he has a personal or family history of thyroid cancer, parathyroid cancer, or adrenal gland cancer. We may also need to consider starting him on insulin. His LDL cholesterol is not quite at goal. Would he be willing to do an injection for his cholesterol that he would take every 2 weeks?

## 2020-01-28 ENCOUNTER — Other Ambulatory Visit: Payer: Self-pay | Admitting: Family Medicine

## 2020-01-28 DIAGNOSIS — I10 Essential (primary) hypertension: Secondary | ICD-10-CM

## 2020-01-28 DIAGNOSIS — E1165 Type 2 diabetes mellitus with hyperglycemia: Secondary | ICD-10-CM

## 2020-01-29 ENCOUNTER — Other Ambulatory Visit: Payer: Self-pay | Admitting: Family Medicine

## 2020-01-29 DIAGNOSIS — E1165 Type 2 diabetes mellitus with hyperglycemia: Secondary | ICD-10-CM

## 2020-02-09 ENCOUNTER — Other Ambulatory Visit: Payer: Self-pay | Admitting: Family Medicine

## 2020-02-09 DIAGNOSIS — E1165 Type 2 diabetes mellitus with hyperglycemia: Secondary | ICD-10-CM

## 2020-02-09 DIAGNOSIS — I10 Essential (primary) hypertension: Secondary | ICD-10-CM

## 2020-02-18 ENCOUNTER — Telehealth: Payer: Self-pay | Admitting: Family Medicine

## 2020-02-18 NOTE — Telephone Encounter (Signed)
Pt's wife called in wanted to know why her husband's labs are being repeated

## 2020-02-19 ENCOUNTER — Other Ambulatory Visit: Payer: Self-pay

## 2020-02-19 ENCOUNTER — Ambulatory Visit (INDEPENDENT_AMBULATORY_CARE_PROVIDER_SITE_OTHER): Payer: BC Managed Care – PPO | Admitting: Cardiovascular Disease

## 2020-02-19 ENCOUNTER — Encounter: Payer: Self-pay | Admitting: Cardiovascular Disease

## 2020-02-19 VITALS — BP 136/60 | HR 92 | Ht 71.0 in | Wt 287.2 lb

## 2020-02-19 DIAGNOSIS — I2510 Atherosclerotic heart disease of native coronary artery without angina pectoris: Secondary | ICD-10-CM

## 2020-02-19 DIAGNOSIS — I10 Essential (primary) hypertension: Secondary | ICD-10-CM | POA: Diagnosis not present

## 2020-02-19 DIAGNOSIS — E785 Hyperlipidemia, unspecified: Secondary | ICD-10-CM | POA: Diagnosis not present

## 2020-02-19 DIAGNOSIS — I255 Ischemic cardiomyopathy: Secondary | ICD-10-CM

## 2020-02-19 NOTE — Telephone Encounter (Signed)
I called and spoke with the patient and his wife and the patient never started the repatha and he cancelled the lab appt to recheck his cholesterol because he did not start the medicine and he rescheduled his appt to a later time with the provider because he started a new job.  Ruxin Ransome,cma

## 2020-02-19 NOTE — Progress Notes (Signed)
Cardiology Office Note   Date:  02/19/2020   ID:  [name], DOB [birth year], MRN [id number]  PCP:  Michael Haven, MD  Cardiologist:   Michael Sacramento, MD   Chief Complaint  Patient presents with  . other    2 month follow up. Meds reviewed by the pt. verbally. "doing well."       History of Present Illness:  Michael Jordan is a 64 y.o. male who presents for a follow-up visit regarding coronary artery disease.  He was hospitalized in March 2017 with anterior ST elevation myocardial infarction. Cardiac catheterization showed occluded proximal LAD with mild disease affecting the diagonals, chronically occluded right coronary artery with left to right collaterals and moderate left circumflex disease. He underwent successful angioplasty and drug-eluting stent placement in the proximal LAD. Ejection fraction was 40-45% by LV gram and 35-40% by echocardiogram.  He has other chronic medical conditions that include morbid obesity, hypertension, hyperlipidemia and type 2 diabetes. He quit Smoking in November 2016  He lost to follow-up since January 2019 but reestablished earlier this year.  He had left upper extremity cellulitis in May was treated with antibiotics. He had a repeat echocardiogram in May which showed an EF of 45 to 50% with mild LVH.  He has been doing well with no recent chest pain or worsening dyspnea.  No palpitations.  He recently started working at [employer].  He is planning to do that until he retires next year.  Past Medical History:  Diagnosis Date  . Abnormal stress test   . CAD (coronary artery disease)    a. 09/2015 Ant STEMI/PCI: LAD 100p (3.0x18 Xience Alpine DES), RCA 80p/129m(CTO)->fills via collats, EF 45-50%; b. 10/2015 MV: fixed septal/inf defects w/ dramatic ECG changes; c. 10/2015 Cath: LM nl, LAD patent stent, D1 40, SP1 40, LCX 490mRCA 80p/10060mF 45-50%-->Med Rx.  . Cardiomyopathy, ischemic    a. 09/2015 Echo: EF 35-40%, sev apical, periapical,  antapical HK, mild to mod ant, inf HK, Gr1 DD.  . HMarland Kitchenstory of tobacco abuse    a. Quit 05/2015.  . HMarland Kitchenperlipidemia   . Morbid obesity (HCCEvart . Tobacco abuse 10/01/2015  . Type II diabetes mellitus ([facility]CCMundys Corner   Past Surgical History:  Procedure Laterality Date  . CARDIAC CATHETERIZATION N/A 09/30/2015   Procedure: Left Heart Cath and Coronary Angiography;  Surgeon: Michael Jordan;  [facility];  Service: Cardiovascular;  Laterality: N/A;  . CARDIAC CATHETERIZATION N/A 09/30/2015   Procedure: Coronary Stent Intervention;  Surgeon: Michael Jordan;  [facility];  Service: Cardiovascular;  Laterality: N/A;  . CARDIAC CATHETERIZATION N/A 11/11/2015   Procedure: Left Heart Cath and Coronary Angiography;  Surgeon: Michael Jordan;  [facility];  Service: Cardiovascular;  Laterality: N/A;  . TONSILLECTOMY       Current Outpatient Medications  Medication Sig Dispense Refill  . aspirin 81 MG chewable tablet Chew 1 tablet (81 mg total) by mouth daily.    . aMarland Kitchenorvastatin (LIPITOR) 80 MG tablet TAKE 1 TABLET (80 MG TOTAL) BY MOUTH DAILY AT 6 PM. 90 tablet 1  . blood glucose meter kit and supplies KIT Dispense based on patient and insurance preference. Use once daily as directed. (FOR ICD-10 E11.9). 1 each 0  . carvedilol (COREG) 12.5 MG tablet TAKE 1 TABLET BY MOUTH 2 TIMES DAILY.(REFILLS NOT COVERED) 180 tablet 1  . glipiZIDE (GLUCOTROL) 5  MG tablet TAKE 0.5 TABLETS (2.5 MG TOTAL) BY MOUTH DAILY BEFORE BREAKFAST. 30 tablet 0  . glucose blood test strip Use once daily as directed E11.9 100 each 1  . JARDIANCE 25 MG TABS tablet TAKE 1 TABLET BY MOUTH EVERY DAY 30 tablet 0  . linagliptin (TRADJENTA) 5 MG TABS tablet Take 1 tablet (5 mg total) by mouth daily. 30 tablet 0  . lisinopril (ZESTRIL) 5 MG tablet TAKE 1 TABLET BY MOUTH EVERY DAY(REFILLS NOT COVERED). 90 tablet 1  . metFORMIN (GLUCOPHAGE) 1000 MG tablet TAKE 1 TABLET (1,000 MG  TOTAL) BY MOUTH 2 (TWO) TIMES DAILY WITH A MEAL. 180 tablet 1  . mupirocin ointment (BACTROBAN) 2 % Place 1 application into the nose 3 (three) times daily. Left arm and nose with Q tip do not contaminate tube 60 g 0  . nitroGLYCERIN (NITROSTAT) 0.4 MG SL tablet Place 1 tablet (0.4 mg total) under the tongue every 5 (five) minutes as needed for chest pain. 25 tablet 0  . spironolactone (ALDACTONE) 25 MG tablet TAKE 0.5 TABLETS (12.5 MG TOTAL) BY MOUTH DAILY. IN AM 30 tablet 0  . ticagrelor (BRILINTA) 60 MG TABS tablet Take 1 tablet (60 mg total) by mouth 2 (two) times daily. 180 tablet 2   No current facility-administered medications for this visit.    Allergies:   Patient has no known allergies.    Social History:  The patient  reports that he quit smoking about 4 years ago. His smoking use included cigarettes. He has a 22.00 pack-year smoking history. He has never used smokeless tobacco. He reports current alcohol use. He reports that he does not use drugs.   Family History:  The patient's family history includes Diabetes in his mother; Heart attack in his father, mother, and paternal grandfather.    ROS:  Please see the history of present illness.   Otherwise, review of systems are positive for none.   All other systems are reviewed and negative.    PHYSICAL EXAM: VS:  BP (!) 136/60 (BP Location: Left Arm, Patient Position: Sitting, Cuff Size: Large)   Pulse 92   Ht _0  (1.803 m)   Wt (!) 287 lb 4 oz (130.3 kg)   SpO2 97%   BMI 40.06 kg/m  , BMI Body mass index is 40.06 kg/m. GEN: Well nourished, well developed, in no acute distress  HEENT: normal  Neck: no JVD, carotid bruits, or masses Cardiac: RRR; no murmurs, rubs, or gallops,no edema  Respiratory:  clear to auscultation bilaterally, normal work of breathing GI: soft, nontender, nondistended, + BS MS: no deformity or atrophy  Skin: warm and dry, no rash Neuro:  Strength and sensation are intact Psych: euthymic mood,  full affect   EKG:  EKG is ordered today. EKG showed normal sinus rhythm with no significant ST or T wave changes.  Incomplete right bundle branch block.   Recent Labs: 12/26/2019: ALT 15; BUN 16; Creatinine, Ser 0.83; Hemoglobin 14.0; Platelets 391.0; Potassium 5.1; Sodium 134    Lipid Panel    Component Value Date/Time   CHOL 161 12/26/2019 0818   TRIG 146.0 12/26/2019 0818   HDL 45.20 12/26/2019 0818   CHOLHDL 4 12/26/2019 0818   VLDL 29.2 12/26/2019 0818   LDLCALC 86 12/26/2019 0818   LDLDIRECT 117.0 11/26/2017 0821      Wt Readings from Last 3 Encounters:  02/19/20 (!) 287 lb 4 oz (130.3 kg)  12/26/19 282 lb 12.8 oz (128.3 kg)  12/15/19 286  lb (129.7 kg)        ASSESSMENT AND PLAN:  1.  Coronary artery disease involving native coronary arteries without angina: He is doing very well with no anginal symptoms.  It has been more than 2 years since his myocardial infarction and stent placement.  Thus, I elected to discontinue Brilinta.  Continue aspirin indefinitely.  2. Hyperlipidemia: He recently resumed atorvastatin.  Most recent LDL in June was 47 but he was not on atorvastatin for a long time.  He is going to have a follow-up lipid profile.  If LDL remains above 70, I recommend adding Zetia.  3. Ischemic cardiomyopathy: Most recent echo showed improvement in ejection fraction to 45 to 50%.  Continue treatment with carvedilol, lisinopril and spironolactone.   4. Essential hypertension: Blood pressure is controlled on current medications.    Disposition:   FU with me in 6 months  Signed,  Michael Sacramento, MD  02/19/2020 3:32 PM    [facility]

## 2020-02-19 NOTE — Patient Instructions (Addendum)
Medication Instructions:  Your physician has recommended you make the following change in your medication:   STOP Brilinta   *If you need a refill on your cardiac medications before your next appointment, please call your pharmacy*   Lab Work: None ordered If you have labs (blood work) drawn today and your tests are completely normal, you will receive your results only by: Marland Kitchen MyChart Message (if you have MyChart) OR . A paper copy in the mail If you have any lab test that is abnormal or we need to change your treatment, we will call you to review the results.   Testing/Procedures: None ordered    Follow-Up: At [facility], you and your health needs are our priority.  As part of our continuing mission to provide you with exceptional heart care, we have created designated Provider Care Teams.  These Care Teams include your primary Cardiologist (physician) and Advanced Practice Providers (APPs -  Physician Assistants and Nurse Practitioners) who all work together to provide you with the care you need, when you need it.  We recommend signing up for the patient portal called "MyChart".  Sign up information is provided on this After Visit Summary.  MyChart is used to connect with patients for Virtual Visits (Telemedicine).  Patients are able to view lab/test results, encounter notes, upcoming appointments, etc.  Non-urgent messages can be sent to your provider as well.   To learn more about what you can do with MyChart, go to [email or web address].    Your next appointment:   6 month(s)  The format for your next appointment:   In Person  Provider:    You may see Lorine Bears, MD or one of the following Advanced Practice Providers on your designated Care Team:    Nicolasa Ducking, NP  Eula Listen, PA-C  Marisue Ivan, PA-C    Other Instructions N/A

## 2020-02-23 ENCOUNTER — Other Ambulatory Visit: Payer: BC Managed Care – PPO

## 2020-03-11 ENCOUNTER — Other Ambulatory Visit: Payer: Self-pay | Admitting: Family Medicine

## 2020-03-11 DIAGNOSIS — E1165 Type 2 diabetes mellitus with hyperglycemia: Secondary | ICD-10-CM

## 2020-03-22 ENCOUNTER — Other Ambulatory Visit: Payer: Self-pay

## 2020-03-24 ENCOUNTER — Ambulatory Visit: Payer: BC Managed Care – PPO | Admitting: Pharmacist

## 2020-03-24 ENCOUNTER — Ambulatory Visit (INDEPENDENT_AMBULATORY_CARE_PROVIDER_SITE_OTHER): Payer: BC Managed Care – PPO | Admitting: Family Medicine

## 2020-03-24 ENCOUNTER — Encounter: Payer: Self-pay | Admitting: Family Medicine

## 2020-03-24 ENCOUNTER — Other Ambulatory Visit: Payer: Self-pay

## 2020-03-24 ENCOUNTER — Ambulatory Visit: Payer: BC Managed Care – PPO | Admitting: Family Medicine

## 2020-03-24 DIAGNOSIS — I10 Essential (primary) hypertension: Secondary | ICD-10-CM

## 2020-03-24 DIAGNOSIS — E1165 Type 2 diabetes mellitus with hyperglycemia: Secondary | ICD-10-CM

## 2020-03-24 DIAGNOSIS — I252 Old myocardial infarction: Secondary | ICD-10-CM

## 2020-03-24 DIAGNOSIS — I2510 Atherosclerotic heart disease of native coronary artery without angina pectoris: Secondary | ICD-10-CM | POA: Diagnosis not present

## 2020-03-24 DIAGNOSIS — IMO0002 Reserved for concepts with insufficient information to code with codable children: Secondary | ICD-10-CM

## 2020-03-24 DIAGNOSIS — E1159 Type 2 diabetes mellitus with other circulatory complications: Secondary | ICD-10-CM

## 2020-03-24 MED ORDER — FREESTYLE LIBRE 2 SENSOR MISC: 3 refills | Status: DC

## 2020-03-24 MED ORDER — OZEMPIC (0.25 OR 0.5 MG/DOSE) 2 MG/1.5ML ~~LOC~~ SOPN: PEN_INJECTOR | SUBCUTANEOUS | 1 refills | Status: DC

## 2020-03-24 NOTE — Assessment & Plan Note (Signed)
Asymptomatic.  Continue current regimen. 

## 2020-03-24 NOTE — Assessment & Plan Note (Signed)
Uncontrolled.  We will start him on Ozempic.  He denies personal or family history of thyroid cancer, parathyroid cancer, or adrenal cancer.  Discussed rats studies showing medullary risk of thyroid cancer though that this has not been seen in humans.  Continue glipizide and Jardiance.  Encouraged him to see ophthalmology yearly for an eye exam.

## 2020-03-24 NOTE — Progress Notes (Signed)
[name], MD Phone: [phone]  Michael Jordan is a 64 y.o. male who presents today for f/u.  DIABETES Disease Monitoring: Blood Sugar ranges-160-250 Polyuria/phagia/dipsia- no      Optho- due Medications: Compliance- taking glipizide, jardiance, tradjenta Hypoglycemic symptoms- no  HYPERTENSION  Disease Monitoring  Home BP Monitoring similar to today Chest pain- no    Dyspnea- no Medications  Compliance-  Taking spironolactone, coreg, lisinopril.  Edema- no Notes his echo was improved. EF improved. Brilinta stopped by cardiology.     Social History   Tobacco Use  Smoking Status Former Smoker  . Packs/day: 0.50  . Years: 44.00  . Pack years: 22.00  . Types: Cigarettes  . Quit date: 06/02/2015  . Years since quitting: 4.8  Smokeless Tobacco Never Used     ROS see history of present illness  Objective  Physical Exam Vitals:   03/24/20 1317  BP: 110/70  Pulse: 67  Temp: 98.4 F (36.9 C)  SpO2: 94%    BP Readings from Last 3 Encounters:  03/24/20 110/70  02/19/20 (!) 136/60  12/26/19 120/70   Wt Readings from Last 3 Encounters:  03/24/20 288 lb (130.6 kg)  02/19/20 (!) 287 lb 4 oz (130.3 kg)  12/26/19 282 lb 12.8 oz (128.3 kg)    Physical Exam Constitutional:      General: He is not in acute distress.    Appearance: He is not diaphoretic.  Cardiovascular:     Rate and Rhythm: Normal rate and regular rhythm.     Heart sounds: Normal heart sounds.  Pulmonary:     Effort: Pulmonary effort is normal.     Breath sounds: Normal breath sounds.  Musculoskeletal:     Right lower leg: No edema.     Left lower leg: No edema.  Skin:    General: Skin is warm and dry.  Neurological:     Mental Status: He is alert.      Assessment/Plan: Please see individual problem list.  Essential hypertension Adequate control.  Continue current regimen.  CAD (coronary artery disease) Asymptomatic.  Continue current regimen.  Uncontrolled type 2  diabetes mellitus with circulatory disorder (HCC) Uncontrolled.  We will start him on Ozempic.  He denies personal or family history of thyroid cancer, parathyroid cancer, or adrenal cancer.  Discussed rats studies showing medullary risk of thyroid cancer though that this has not been seen in humans.  Continue glipizide and Jardiance.  Encouraged him to see ophthalmology yearly for an eye exam.   Health Maintenance: Discussed Covid vaccine with the patient.  He adamantly declines this.  I encouraged him to reconsider and discuss that the majority of people that are currently dying or hospitalized with Covid are those who are unvaccinated.  Discussed given his medical history he is at risk for complications from Covid.  Patient also declines colonoscopy and other colon cancer screening.  Orders Placed This Encounter  Procedures  . LDL cholesterol, direct    Standing Status:   Future    Standing Expiration Date:   03/24/2021  . Hemoglobin A1c    Standing Status:   Future    Standing Expiration Date:   03/24/2021    Meds ordered this encounter  Medications  . Semaglutide,0.25 or 0.5MG /DOS, (OZEMPIC, 0.25 OR 0.5 MG/DOSE,) 2 MG/1.5ML SOPN    Sig: Inject 0.1875 mLs (0.25 mg total) into the skin once a week for 28 days, THEN 0.375 mLs (0.5 mg total) once a week.    Dispense:  4.5  mL    Refill:  1    This visit occurred during the SARS-CoV-2 public health emergency.  Safety protocols were in place, including screening questions prior to the visit, additional usage of staff PPE, and extensive cleaning of exam room while observing appropriate contact time as indicated for disinfecting solutions.    [name], MD [facility]

## 2020-03-24 NOTE — Assessment & Plan Note (Signed)
Adequate control. Continue current regimen.  

## 2020-03-24 NOTE — Patient Instructions (Signed)
Nice to see you. Please start the Ozempic.  If it is too expensive please let us know.

## 2020-03-24 NOTE — Patient Instructions (Signed)
Visit Information  Goals Addressed              This Visit's Progress     Patient Stated   .  PharmD "I need to get my sugars better controlled" (pt-stated)        CARE PLAN ENTRY (see longitudinal plan of care for additional care plan information)  Current Barriers:  . Social, financial, community barriers:  o Reports wanting to make sure that he can afford Ozempic o Works part time at [employer] in the mornings. Reports some knee pain after working.  . Diabetes: uncontrolled; complicated by chronic medical conditions including CAD (hx STEMI 09/2015), CHF (reduced, now recovered), HLD, most recent A1c 14.5% . Most recent eGFR: 93 . Current antihyperglycemic regimen: metformin 1000 mg BID, Jardiance 25 mg daily, glipizide 2.5 mg QAM, Tradjenta 5 mg daily  . Reports hyperglycemic symptoms, including polyuria, polydipsia . Current blood glucose readings: reports that he hates sticking his fingers, refuses to check more than once daily o Fastings: 170-200s . Cardiovascular risk reduction: o Current hypertensive regimen: carvedilol 12.5 mg BID, lisinopril 5 mg daily, spironolactone 12.5 mg daily o Current hyperlipidemia regimen: atorvastatin 80 mg daily; last LDL not at goal <70, o Current antiplatelet regimen: ASA 81 mg daily . Eye exam: overdue . Nephropathy screening: up to date  Pharmacist Clinical Goal(s):  Marland Kitchen Over the next 90 days, patient will work with PharmD and primary care provider to address optimized medication management  Interventions: . Comprehensive medication review performed, medication list updated in electronic medical record . Inter-disciplinary care team collaboration (see longitudinal plan of care) . Reviewed goal A1c, goal fasting, and goal 2 hour post prandial glucose . Discussed benefits of CGM. Patient interested. Sent script for [place] 2. Called pharmacist, appears insurance does not cover much of the cost, as was >$200 for 90 day supply. Provided voucher  card information to reduce cost to $75/month . Agree w/ PCP to d/c Tradjenta and start Ozempic. Discussed mechanism of action, benefits, side effects. Patient verbalized understanding. Discussed concept of savings cards.  . Reviewed goal LDL. Discussed next step options including ezetimibe (if close to goal) vs PCSK9i. Will follow lipid panel.  Patient Self Care Activities:  . Patient will check blood glucose regularly, document, and provide at future appointments . Patient will take medications as prescribed . Patient will contact provider with any episodes of hypoglycemia . Patient will report any questions or concerns to provider   Initial goal documentation        Mr. Pascucci was given information about Chronic Care Management services today including:  1. CCM service includes personalized support from designated clinical staff supervised by his physician, including individualized plan of care and coordination with other care providers 2. Only one practitioner may furnish and bill the service in a calendar month. 3. The patient may stop CCM services at any time (effective at the end of the month) by phone call to the office staff.  Patient agreed to services and verbal consent obtained.   The patient verbalized understanding of instructions provided today and declined a print copy of patient instruction materials.   Plan: - Scheduled f/u call in ~ 6 weeks  Catie Darnelle Maffucci, PharmD, [place], [place] Pharmacist [place] [phone]

## 2020-03-24 NOTE — Chronic Care Management (AMB) (Signed)
Chronic Care Management   Note  03/24/2020 Name: Michael Jordan MRN: [id number] DOB: Oct 24, 1955   Subjective:  Michael Jordan is a 64 y.o. year old male who is a primary care patient of Caryl Bis, Angela Adam, MD. The CCM team was consulted for assistance with chronic disease management and care coordination needs.    Met with patient and his wife after PCP appt.   [name] was given information about Chronic Care Management services today including:  1. CCM service includes personalized support from designated clinical staff supervised by his physician, including individualized plan of care and coordination with other care providers 2. 24/7 contact phone numbers for assistance for urgent and routine care needs. 3. The patient may stop CCM services at any time (effective at the end of the month) by phone call to the office staff.  Patient agreed to services and verbal consent obtained.   Review of patient status, including review of consultants reports, laboratory and other test data, was performed as part of comprehensive evaluation and provision of chronic care management services.   SDOH (Social Determinants of Health) assessments and interventions performed:  SDOH Interventions     Most Recent Value  SDOH Interventions  Financial Strain Interventions Other (Comment)  [educated on savings cards]       Objective:  Lab Results  Component Value Date   CREATININE 0.83 12/26/2019   CREATININE 1.02 11/26/2017   CREATININE 0.87 06/04/2017    Lab Results  Component Value Date   HGBA1C 14.5 (H) 12/26/2019       Component Value Date/Time   CHOL 161 12/26/2019 0818   TRIG 146.0 12/26/2019 0818   HDL 45.20 12/26/2019 0818   CHOLHDL 4 12/26/2019 0818   VLDL 29.2 12/26/2019 0818   LDLCALC 86 12/26/2019 0818   LDLDIRECT 117.0 11/26/2017 0821    Clinical ASCVD: Yes  The 10-year ASCVD risk score [name], et al., 2013) is: 19.2%   Values used to calculate the score:     Age:  41 years     Sex: Male     Is Non-Hispanic African American: No     Diabetic: Yes     Tobacco smoker: No     Systolic Blood Pressure: 017 mmHg     Is BP treated: Yes     HDL Cholesterol: 45.2 mg/dL     Total Cholesterol: 161 mg/dL    BP Readings from Last 3 Encounters:  03/24/20 110/70  02/19/20 (!) 136/60  12/26/19 120/70    No Known Allergies  Medications Reviewed Today    Reviewed by [name], [facility] (Pharmacist) on 03/24/20 at [place] List Status: <None>  Medication Order Taking? Sig Documenting Provider Last Dose Status Informant  aspirin 81 MG chewable tablet [id number] Yes Chew 1 tablet (81 mg total) by mouth daily. Minna Merritts, MD Taking Active Self  atorvastatin (LIPITOR) 80 MG tablet [id number] Yes TAKE 1 TABLET (80 MG TOTAL) BY MOUTH DAILY AT 6 PM. [name], MD Taking Active   blood glucose meter kit and supplies KIT [id number] Yes Dispense based on patient and insurance preference. Use once daily as directed. (FOR ICD-10 E11.9). [name], MD Taking Active   carvedilol (COREG) 12.5 MG tablet [id number] Yes TAKE 1 TABLET BY MOUTH 2 TIMES DAILY.(REFILLS NOT COVERED) [name], MD Taking Active   glipiZIDE (GLUCOTROL) 5 MG tablet [id number] Yes TAKE 0.5 TABLETS (2.5 MG TOTAL) BY MOUTH DAILY BEFORE BREAKFAST. [name],  MD Taking Active   glucose blood test strip [id number] Yes Use once daily as directed E11.9 [name], MD Taking Active   JARDIANCE 25 MG TABS tablet [id number] Yes TAKE 1 TABLET BY MOUTH EVERY DAY [name], MD Taking Active   lisinopril (ZESTRIL) 5 MG tablet [id number] Yes TAKE 1 TABLET BY MOUTH EVERY DAY(REFILLS NOT COVERED). [name], MD Taking Active   metFORMIN (GLUCOPHAGE) 1000 MG tablet [id number] Yes TAKE 1 TABLET (1,000 MG TOTAL) BY MOUTH 2 (TWO) TIMES DAILY WITH A MEAL. [name], MD Taking Active   mupirocin ointment (BACTROBAN) 2 % [id number]  Place 1 application into the  nose 3 (three) times daily. Left arm and nose with Q tip do not contaminate tube  Patient not taking: Reported on 03/24/2020   McLean-Scocuzza, Nino Glow, MD  Active   nitroGLYCERIN (NITROSTAT) 0.4 MG SL tablet [id number]  Place 1 tablet (0.4 mg total) under the tongue every 5 (five) minutes as needed for chest pain. McLean-Scocuzza, Nino Glow, MD  Active   Semaglutide,0.25 or 0.5MG/DOS, (OZEMPIC, 0.25 OR 0.5 MG/DOSE,) 2 MG/1.5ML SOPN [id number]  Inject 0.1875 mLs (0.25 mg total) into the skin once a week for 28 days, THEN 0.375 mLs (0.5 mg total) once a week. [name], MD  Active   spironolactone (ALDACTONE) 25 MG tablet [id number] Yes TAKE 0.5 TABLETS (12.5 MG TOTAL) BY MOUTH DAILY. IN AM [name], MD Taking Active            Assessment:   Goals Addressed              This Visit's Progress     Patient Stated   .  PharmD "I need to get my sugars better controlled" (pt-stated)        CARE PLAN ENTRY (see longitudinal plan of care for additional care plan information)  Current Barriers:  . Social, financial, community barriers:  o Reports wanting to make sure that he can afford Ozempic o Works part time at [employer] in the mornings. Reports some knee pain after working.  . Diabetes: uncontrolled; complicated by chronic medical conditions including CAD (hx STEMI 09/2015), CHF (reduced, now recovered), HLD, most recent A1c 14.5% . Most recent eGFR: 93 . Current antihyperglycemic regimen: metformin 1000 mg BID, Jardiance 25 mg daily, glipizide 2.5 mg QAM, Tradjenta 5 mg daily  . Reports hyperglycemic symptoms, including polyuria, polydipsia . Current blood glucose readings: reports that he hates sticking his fingers, refuses to check more than once daily o Fastings: 170-200s . Cardiovascular risk reduction: o Current hypertensive regimen: carvedilol 12.5 mg BID, lisinopril 5 mg daily, spironolactone 12.5 mg daily o Current hyperlipidemia regimen: atorvastatin 80 mg daily;  last LDL not at goal <70, o Current antiplatelet regimen: ASA 81 mg daily . Eye exam: overdue . Nephropathy screening: up to date  Pharmacist Clinical Goal(s):  Marland Kitchen Over the next 90 days, patient will work with PharmD and primary care provider to address optimized medication management  Interventions: . Comprehensive medication review performed, medication list updated in electronic medical record . Inter-disciplinary care team collaboration (see longitudinal plan of care) . Reviewed goal A1c, goal fasting, and goal 2 hour post prandial glucose . Discussed benefits of CGM. Patient interested. Sent script for [place] 2. Called pharmacist, appears insurance does not cover much of the cost, as was >$200 for 90 day supply. Provided voucher card information to reduce cost to $75/month . Agree w/ PCP to d/c Tradjenta and start Ozempic.  Discussed mechanism of action, benefits, side effects. Patient verbalized understanding. Discussed concept of savings cards.  . Reviewed goal LDL. Discussed next step options including ezetimibe (if close to goal) vs PCSK9i. Will follow lipid panel.  Patient Self Care Activities:  . Patient will check blood glucose regularly, document, and provide at future appointments . Patient will take medications as prescribed . Patient will contact provider with any episodes of hypoglycemia . Patient will report any questions or concerns to provider   Initial goal documentation        Plan: - Scheduled f/u call in ~ 6 weeks  Catie Darnelle Maffucci, PharmD, [place], [place] Pharmacist [place] [phone]

## 2020-03-25 ENCOUNTER — Ambulatory Visit: Payer: BC Managed Care – PPO | Admitting: Pharmacist

## 2020-03-25 DIAGNOSIS — IMO0002 Reserved for concepts with insufficient information to code with codable children: Secondary | ICD-10-CM

## 2020-03-25 DIAGNOSIS — E1159 Type 2 diabetes mellitus with other circulatory complications: Secondary | ICD-10-CM

## 2020-03-25 DIAGNOSIS — E1165 Type 2 diabetes mellitus with hyperglycemia: Secondary | ICD-10-CM

## 2020-03-25 NOTE — Patient Instructions (Signed)
Visit Information  Goals Addressed              This Visit's Progress     Patient Stated   .  PharmD "I need to get my sugars better controlled" (pt-stated)        CARE PLAN ENTRY (see longitudinal plan of care for additional care plan information)  Current Barriers:  . Social, financial, community barriers:  o Wife calls today. Appears dosing instructions were not put on the label (Label says "see attached" but no instructions attached) . Diabetes: uncontrolled; complicated by chronic medical conditions including CAD (hx STEMI 09/2015), CHF (reduced, now recovered), HLD, most recent A1c 14.5% . Most recent eGFR: 93 . Current antihyperglycemic regimen: metformin 1000 mg BID, Jardiance 25 mg daily, glipizide 2.5 mg QAM, Ozempic 0.25 mg weekly . Cardiovascular risk reduction: o Current hypertensive regimen: carvedilol 12.5 mg BID, lisinopril 5 mg daily, spironolactone 12.5 mg daily o Current hyperlipidemia regimen: atorvastatin 80 mg daily; last LDL not at goal <70, o Current antiplatelet regimen: ASA 81 mg daily . Eye exam: overdue . Nephropathy screening: up to date  Pharmacist Clinical Goal(s):  Marland Kitchen Over the next 90 days, patient will work with PharmD and primary care provider to address optimized medication management  Interventions: . Comprehensive medication review performed, medication list updated in electronic medical record . Inter-disciplinary care team collaboration (see longitudinal plan of care) . Reviewed dosing instructions: 0.25 mg weekly x 4 weeks then increase to 0.5 mg weekly. Wife and patient verbalized understanding  Patient Self Care Activities:  . Patient will check blood glucose regularly, document, and provide at future appointments . Patient will take medications as prescribed . Patient will contact provider with any episodes of hypoglycemia . Patient will report any questions or concerns to provider   Please see past updates related to this goal by  clicking on the "Past Updates" button in the selected goal         The patient verbalized understanding of instructions provided today and declined a print copy of patient instruction materials.   Plan:  - Will outreach as previously scheduled  Catie Darnelle Maffucci, PharmD, [place], [place] Pharmacist [place] [phone]

## 2020-03-25 NOTE — Chronic Care Management (AMB) (Signed)
Chronic Care Management   Follow Up Note   03/25/2020 Name: Michael Jordan MRN: [id number] DOB: January 23, 1956  Referred by: [name], MD Reason for referral : Chronic Care Management (Medication Management)   Michael Jordan is a 64 y.o. year old male who is a primary care patient of Caryl Bis, Angela Adam, MD. The CCM team was consulted for assistance with chronic disease management and care coordination needs.    Received call from patient's wife today with medication management question.   Review of patient status, including review of consultants reports, relevant laboratory and other test results, and collaboration with appropriate care team members and the patient's provider was performed as part of comprehensive patient evaluation and provision of chronic care management services.    SDOH (Social Determinants of Health) assessments performed: No See Care Plan activities for detailed interventions related to [facility])     Outpatient Encounter Medications as of 03/25/2020  Medication Sig  . aspirin 81 MG chewable tablet Chew 1 tablet (81 mg total) by mouth daily.  Marland Kitchen atorvastatin (LIPITOR) 80 MG tablet TAKE 1 TABLET (80 MG TOTAL) BY MOUTH DAILY AT 6 PM.  . blood glucose meter kit and supplies KIT Dispense based on patient and insurance preference. Use once daily as directed. (FOR ICD-10 E11.9).  . carvedilol (COREG) 12.5 MG tablet TAKE 1 TABLET BY MOUTH 2 TIMES DAILY.(REFILLS NOT COVERED)  . Continuous Blood Gluc Sensor (FREESTYLE LIBRE 2 SENSOR) MISC Use to check glucose at least QID  . glipiZIDE (GLUCOTROL) 5 MG tablet TAKE 0.5 TABLETS (2.5 MG TOTAL) BY MOUTH DAILY BEFORE BREAKFAST.  Marland Kitchen glucose blood test strip Use once daily as directed E11.9  . JARDIANCE 25 MG TABS tablet TAKE 1 TABLET BY MOUTH EVERY DAY  . lisinopril (ZESTRIL) 5 MG tablet TAKE 1 TABLET BY MOUTH EVERY DAY(REFILLS NOT COVERED).  . metFORMIN (GLUCOPHAGE) 1000 MG tablet TAKE 1 TABLET (1,000 MG TOTAL) BY MOUTH 2 (TWO)  TIMES DAILY WITH A MEAL.  . mupirocin ointment (BACTROBAN) 2 % Place 1 application into the nose 3 (three) times daily. Left arm and nose with Q tip do not contaminate tube (Patient not taking: Reported on 03/24/2020)  . nitroGLYCERIN (NITROSTAT) 0.4 MG SL tablet Place 1 tablet (0.4 mg total) under the tongue every 5 (five) minutes as needed for chest pain.  . Semaglutide,0.25 or 0.5MG /DOS, (OZEMPIC, 0.25 OR 0.5 MG/DOSE,) 2 MG/1.5ML SOPN Inject 0.1875 mLs (0.25 mg total) into the skin once a week for 28 days, THEN 0.375 mLs (0.5 mg total) once a week.  . spironolactone (ALDACTONE) 25 MG tablet TAKE 0.5 TABLETS (12.5 MG TOTAL) BY MOUTH DAILY. IN AM   No facility-administered encounter medications on file as of 03/25/2020.     Objective:   Goals Addressed              This Visit's Progress     Patient Stated   .  PharmD "I need to get my sugars better controlled" (pt-stated)        CARE PLAN ENTRY (see longitudinal plan of care for additional care plan information)  Current Barriers:  . Social, financial, community barriers:  o Wife calls today. Appears dosing instructions were not put on the label (Label says "see attached" but no instructions attached) . Diabetes: uncontrolled; complicated by chronic medical conditions including CAD (hx STEMI 09/2015), CHF (reduced, now recovered), HLD, most recent A1c 14.5% . Most recent eGFR: 93 . Current antihyperglycemic regimen: metformin 1000 mg BID, Jardiance 25 mg  daily, glipizide 2.5 mg QAM, Ozempic 0.25 mg weekly . Cardiovascular risk reduction: o Current hypertensive regimen: carvedilol 12.5 mg BID, lisinopril 5 mg daily, spironolactone 12.5 mg daily o Current hyperlipidemia regimen: atorvastatin 80 mg daily; last LDL not at goal <70, o Current antiplatelet regimen: ASA 81 mg daily . Eye exam: overdue . Nephropathy screening: up to date  Pharmacist Clinical Goal(s):  Marland Kitchen Over the next 90 days, patient will work with PharmD and primary care  provider to address optimized medication management  Interventions: . Comprehensive medication review performed, medication list updated in electronic medical record . Inter-disciplinary care team collaboration (see longitudinal plan of care) . Reviewed dosing instructions: 0.25 mg weekly x 4 weeks then increase to 0.5 mg weekly. Wife and patient verbalized understanding  Patient Self Care Activities:  . Patient will check blood glucose regularly, document, and provide at future appointments . Patient will take medications as prescribed . Patient will contact provider with any episodes of hypoglycemia . Patient will report any questions or concerns to provider   Please see past updates related to this goal by clicking on the "Past Updates" button in the selected goal          Plan:  - Will outreach as previously scheduled  Catie Darnelle Maffucci, PharmD, [place], [place] Pharmacist [place] [phone]

## 2020-03-26 ENCOUNTER — Other Ambulatory Visit (INDEPENDENT_AMBULATORY_CARE_PROVIDER_SITE_OTHER): Payer: BC Managed Care – PPO

## 2020-03-26 ENCOUNTER — Other Ambulatory Visit: Payer: Self-pay

## 2020-03-26 DIAGNOSIS — I2510 Atherosclerotic heart disease of native coronary artery without angina pectoris: Secondary | ICD-10-CM | POA: Diagnosis not present

## 2020-03-26 DIAGNOSIS — E1159 Type 2 diabetes mellitus with other circulatory complications: Secondary | ICD-10-CM

## 2020-03-26 DIAGNOSIS — E1165 Type 2 diabetes mellitus with hyperglycemia: Secondary | ICD-10-CM | POA: Diagnosis not present

## 2020-03-26 DIAGNOSIS — I10 Essential (primary) hypertension: Secondary | ICD-10-CM

## 2020-03-26 DIAGNOSIS — IMO0002 Reserved for concepts with insufficient information to code with codable children: Secondary | ICD-10-CM

## 2020-03-26 LAB — LDL CHOLESTEROL, DIRECT: Direct LDL: 94 mg/dL

## 2020-03-26 LAB — HEMOGLOBIN A1C: Hgb A1c MFr Bld: 9.5 % — ABNORMAL HIGH (ref 4.6–6.5)

## 2020-03-31 ENCOUNTER — Other Ambulatory Visit: Payer: Self-pay | Admitting: Family Medicine

## 2020-03-31 DIAGNOSIS — E1165 Type 2 diabetes mellitus with hyperglycemia: Secondary | ICD-10-CM

## 2020-04-01 ENCOUNTER — Telehealth: Payer: Self-pay

## 2020-04-01 DIAGNOSIS — E785 Hyperlipidemia, unspecified: Secondary | ICD-10-CM

## 2020-04-01 NOTE — Telephone Encounter (Signed)
Pt wife called returning your call  

## 2020-04-01 NOTE — Telephone Encounter (Signed)
-----   Message from [name], MD sent at 03/31/2020  3:03 PM EDT ----- Please let the patient know that his A1c was 9.5.  This is quite a bit improved from 14.5.  We will see how he does on the Ozempic.  His LDL is not at goal.  Would he be willing to consider an injectable cholesterol medication?  If he is not we could try Zetia.

## 2020-04-01 NOTE — Telephone Encounter (Signed)
Informed patient's wife of lab results and she understood, she is okay with starting the Zetia for cholesterol.  He does not want an injectable.  Michael Jordan,cma  

## 2020-04-05 MED ORDER — EZETIMIBE 10 MG PO TABS: 10.0000 mg | ORAL_TABLET | Freq: Every day | ORAL | 3 refills | Status: DC

## 2020-04-05 NOTE — Telephone Encounter (Signed)
Informed patient's wife of lab results and she understood, she is okay with starting the Zetia for cholesterol.  He does not want an injectable.  Milynn Quirion,cma

## 2020-04-05 NOTE — Telephone Encounter (Signed)
I called and spoke with the patient's wife and scheduled a lab appt in 6 weeks to recheck LDL.  Destanee Bedonie,cma

## 2020-04-05 NOTE — Telephone Encounter (Signed)
Zetia sent to pharmacy.  He needs a repeat LDL in 6 weeks.  Orders placed.  Thanks.

## 2020-04-05 NOTE — Addendum Note (Signed)
Addended by: Birdie Sons Brigetta Beckstrom G on: 04/05/2020 12:41 PM   Modules accepted: Orders

## 2020-04-28 ENCOUNTER — Ambulatory Visit: Payer: BC Managed Care – PPO | Admitting: Pharmacist

## 2020-04-28 DIAGNOSIS — I2510 Atherosclerotic heart disease of native coronary artery without angina pectoris: Secondary | ICD-10-CM

## 2020-04-28 DIAGNOSIS — E1165 Type 2 diabetes mellitus with hyperglycemia: Secondary | ICD-10-CM

## 2020-04-28 DIAGNOSIS — E785 Hyperlipidemia, unspecified: Secondary | ICD-10-CM

## 2020-04-28 DIAGNOSIS — I10 Essential (primary) hypertension: Secondary | ICD-10-CM

## 2020-04-28 MED ORDER — EMPAGLIFLOZIN 25 MG PO TABS: 25.0000 mg | ORAL_TABLET | Freq: Every day | ORAL | 3 refills | Status: DC

## 2020-04-28 NOTE — Chronic Care Management (AMB) (Signed)
Chronic Care Management   Follow Up Note   04/28/2020 Name: Michael Jordan MRN: [id number] DOB: March 14, 1956  Referred by: [name], MD Reason for referral : Chronic Care Management (Medication Management)   Michael Jordan is a 64 y.o. year old male who is a primary care patient of Caryl Bis, Angela Adam, MD. The CCM team was consulted for assistance with chronic disease management and care coordination needs.  Contacted patient for medication management review. Spoke with his wife, Michael Jordan.     Review of patient status, including review of consultants reports, relevant laboratory and other test results, and collaboration with appropriate care team members and the patient's provider was performed as part of comprehensive patient evaluation and provision of chronic care management services.    SDOH (Social Determinants of Health) assessments performed: No See Care Plan activities for detailed interventions related to SDOH)  SDOH Interventions     Most Recent Value  SDOH Interventions  Financial Strain Interventions Intervention Not Indicated       Outpatient Encounter Medications as of 04/28/2020  Medication Sig  . aspirin 81 MG chewable tablet Chew 1 tablet (81 mg total) by mouth daily.  Marland Kitchen atorvastatin (LIPITOR) 80 MG tablet TAKE 1 TABLET (80 MG TOTAL) BY MOUTH DAILY AT 6 PM.  . blood glucose meter kit and supplies KIT Dispense based on patient and insurance preference. Use once daily as directed. (FOR ICD-10 E11.9).  . carvedilol (COREG) 12.5 MG tablet TAKE 1 TABLET BY MOUTH 2 TIMES DAILY.(REFILLS NOT COVERED)  . empagliflozin (JARDIANCE) 25 MG TABS tablet Take 1 tablet (25 mg total) by mouth daily.  Marland Kitchen ezetimibe (ZETIA) 10 MG tablet Take 1 tablet (10 mg total) by mouth daily.  Marland Kitchen glipiZIDE (GLUCOTROL) 5 MG tablet TAKE 0.5 TABLETS (2.5 MG TOTAL) BY MOUTH DAILY BEFORE BREAKFAST.  Marland Kitchen glucose blood test strip Use once daily as directed E11.9  . lisinopril (ZESTRIL) 5 MG tablet TAKE 1  TABLET BY MOUTH EVERY DAY(REFILLS NOT COVERED).  . metFORMIN (GLUCOPHAGE) 1000 MG tablet TAKE 1 TABLET (1,000 MG TOTAL) BY MOUTH 2 (TWO) TIMES DAILY WITH A MEAL.  . Semaglutide,0.25 or 0.5MG/DOS, (OZEMPIC, 0.25 OR 0.5 MG/DOSE,) 2 MG/1.5ML SOPN Inject 0.1875 mLs (0.25 mg total) into the skin once a week for 28 days, THEN 0.375 mLs (0.5 mg total) once a week.  . spironolactone (ALDACTONE) 25 MG tablet TAKE 0.5 TABLETS (12.5 MG TOTAL) BY MOUTH DAILY. IN AM  . [DISCONTINUED] JARDIANCE 25 MG TABS tablet TAKE 1 TABLET BY MOUTH EVERY DAY  . mupirocin ointment (BACTROBAN) 2 % Place 1 application into the nose 3 (three) times daily. Left arm and nose with Q tip do not contaminate tube (Patient not taking: Reported on 03/24/2020)  . nitroGLYCERIN (NITROSTAT) 0.4 MG SL tablet Place 1 tablet (0.4 mg total) under the tongue every 5 (five) minutes as needed for chest pain. (Patient not taking: Reported on 04/28/2020)  . [DISCONTINUED] Continuous Blood Gluc Sensor (FREESTYLE LIBRE 2 SENSOR) MISC Use to check glucose at least QID   No facility-administered encounter medications on file as of 04/28/2020.     Objective:   Goals Addressed              This Visit's Progress     Patient Stated   .  PharmD "I need to get my sugars better controlled" (pt-stated)        CARE PLAN ENTRY (see longitudinal plan of care for additional care plan information)  Current Barriers:  .  Social, financial, community barriers:  o Wife denies any concerns at this time.  . Diabetes: uncontrolled; complicated by chronic medical conditions including CAD (hx STEMI 09/2015), CHF (reduced, now recovered), HLD, most recent A1c 9.5% . Most recent eGFR: 93 mL/min . Current antihyperglycemic regimen: metformin 1000 mg BID, Jardiance 25 mg daily, glipizide 2.5 mg QAM, Ozempic 0.5 mg weekly -started this dose last week . Denies any episodes of hypoglycemia  . Current glucose readings:  o Fasting: 150-210 (higher readings with  bigger/higher carb meals) o Not checking post prandials  . Cardiovascular risk reduction: o Current hypertensive regimen: carvedilol 12.5 mg BID, lisinopril 5 mg daily, spironolactone 12.5 mg daily - Not checking BP at home per wife o Current hyperlipidemia regimen: atorvastatin 80 mg daily, ezetimibe 10 mg daily recently added; last LDL not at goal <70, o Current antiplatelet regimen: ASA 81 mg daily . Eye exam: overdue . Nephropathy screening: up to date  Pharmacist Clinical Goal(s):  Marland Kitchen Over the next 90 days, patient will work with PharmD and primary care provider to address optimized medication management  Interventions: . Comprehensive medication review performed, medication list updated in electronic medical record . Inter-disciplinary care team collaboration (see longitudinal plan of care) . Praised for monitoring of BG daily. Encouraged to continue higher dose of Ozempic. Discussed future ability to titrate to 1 mg weekly as needed . Reviewed to contact me if late morning hypoglycemia develops as a result of glipizide  . Discussed goal A1c, goal fasting, and goal 2 hour post prandial glucose. Discussed to alternating checking fasting and post prandial, as wife notes patient refuses to check glucose more than once daily.  . Sent refill for Jardiance for 90 day supply to promote adherence.  . Reviewed purpose of ezetimibe in combination w/ atorvastatin. Continue current regimen. Upcoming lab work to evaluate LDL. If not at goal <70, recommend further discussion about PCSK9i  Patient Self Care Activities:  . Patient will check blood glucose regularly, document, and provide at future appointments . Patient will take medications as prescribed . Patient will contact provider with any episodes of hypoglycemia . Patient will report any questions or concerns to provider   Please see past updates related to this goal by clicking on the "Past Updates" button in the selected goal           Plan:  - Scheduled f/u call in ~ 6 weeks  Catie Darnelle Maffucci, PharmD, [place], [place] Pharmacist [place] [phone]

## 2020-04-28 NOTE — Patient Instructions (Signed)
Michael Jordan,   Keep up the great work!  Our goal A1c is less than 7%. This corresponds with fasting sugars less than 130 and 2 hour after meal sugars less than 180. Please check your blood sugar daily, but you can alternate between fasting and after meals. The higher dose of the Ozempic will help improve your blood sugar readings.   If you develop low blood sugars in the mornings, call us.    Our goal bad cholesterol, or LDL, is less than 70. This is why it is important to continue taking your atorvastatin AND ezetimibe  Feel free to call me with any questions or concerns. I look forward to our next visit!  Catie Darnelle Maffucci, PharmD, [place], CPP Direct line: [phone] Clinic phone: [phone]   Visit Information  Goals Addressed              This Visit's Progress     Patient Stated   .  PharmD "I need to get my sugars better controlled" (pt-stated)        CARE PLAN ENTRY (see longitudinal plan of care for additional care plan information)  Current Barriers:  . Social, financial, community barriers:  o Wife denies any concerns at this time.  . Diabetes: uncontrolled; complicated by chronic medical conditions including CAD (hx STEMI 09/2015), CHF (reduced, now recovered), HLD, most recent A1c 9.5% . Most recent eGFR: 93 mL/min . Current antihyperglycemic regimen: metformin 1000 mg BID, Jardiance 25 mg daily, glipizide 2.5 mg QAM, Ozempic 0.5 mg weekly -started this dose last week . Denies any episodes of hypoglycemia  . Current glucose readings:  o Fasting: 150-210 (higher readings with bigger/higher carb meals) o Not checking post prandials  . Cardiovascular risk reduction: o Current hypertensive regimen: carvedilol 12.5 mg BID, lisinopril 5 mg daily, spironolactone 12.5 mg daily - Not checking BP at home per wife o Current hyperlipidemia regimen: atorvastatin 80 mg daily, ezetimibe 10 mg daily recently added; last LDL not at goal <70, o Current antiplatelet regimen: ASA 81  mg daily . Eye exam: overdue . Nephropathy screening: up to date  Pharmacist Clinical Goal(s):  Marland Kitchen Over the next 90 days, patient will work with PharmD and primary care provider to address optimized medication management  Interventions: . Comprehensive medication review performed, medication list updated in electronic medical record . Inter-disciplinary care team collaboration (see longitudinal plan of care) . Praised for monitoring of BG daily. Encouraged to continue higher dose of Ozempic. Discussed future ability to titrate to 1 mg weekly as needed . Reviewed to contact me if late morning hypoglycemia develops as a result of glipizide  . Discussed goal A1c, goal fasting, and goal 2 hour post prandial glucose. Discussed to alternating checking fasting and post prandial, as wife notes patient refuses to check glucose more than once daily.  . Sent refill for Jardiance for 90 day supply to promote adherence.  . Reviewed purpose of ezetimibe in combination w/ atorvastatin. Continue current regimen. Upcoming lab work to evaluate LDL. If not at goal <70, recommend further discussion about PCSK9i  Patient Self Care Activities:  . Patient will check blood glucose regularly, document, and provide at future appointments . Patient will take medications as prescribed . Patient will contact provider with any episodes of hypoglycemia . Patient will report any questions or concerns to provider   Please see past updates related to this goal by clicking on the "Past Updates" button in the selected goal  The patient verbalized understanding of instructions provided today and declined a print copy of patient instruction materials.   Plan:  - Scheduled f/u call in ~ 6 weeks  Catie Darnelle Maffucci, PharmD, [place], [place] Pharmacist [place] [phone]

## 2020-05-17 ENCOUNTER — Other Ambulatory Visit: Payer: Self-pay

## 2020-05-17 ENCOUNTER — Other Ambulatory Visit: Payer: BC Managed Care – PPO

## 2020-05-17 ENCOUNTER — Other Ambulatory Visit (INDEPENDENT_AMBULATORY_CARE_PROVIDER_SITE_OTHER): Payer: BC Managed Care – PPO

## 2020-05-17 DIAGNOSIS — E785 Hyperlipidemia, unspecified: Secondary | ICD-10-CM

## 2020-05-17 LAB — LDL CHOLESTEROL, DIRECT: Direct LDL: 82 mg/dL

## 2020-05-20 ENCOUNTER — Telehealth: Payer: Self-pay

## 2020-05-20 NOTE — Telephone Encounter (Signed)
-----   Message from [name], MD sent at 05/20/2020 12:55 PM EDT ----- Please let the patient know that his LDL is not at goal.  His goal is less than 70 and his current LDL is 82.  I would suggest that we try to get him approved for an injectable cholesterol medicine to help lower his LDL.  If he is willing to do this we can start the process.  Thanks.

## 2020-06-02 ENCOUNTER — Ambulatory Visit: Payer: BC Managed Care – PPO | Admitting: Pharmacist

## 2020-06-02 DIAGNOSIS — E1165 Type 2 diabetes mellitus with hyperglycemia: Secondary | ICD-10-CM

## 2020-06-02 DIAGNOSIS — IMO0002 Reserved for concepts with insufficient information to code with codable children: Secondary | ICD-10-CM

## 2020-06-02 DIAGNOSIS — E1159 Type 2 diabetes mellitus with other circulatory complications: Secondary | ICD-10-CM

## 2020-06-02 DIAGNOSIS — I255 Ischemic cardiomyopathy: Secondary | ICD-10-CM

## 2020-06-02 DIAGNOSIS — I2510 Atherosclerotic heart disease of native coronary artery without angina pectoris: Secondary | ICD-10-CM

## 2020-06-02 DIAGNOSIS — E785 Hyperlipidemia, unspecified: Secondary | ICD-10-CM

## 2020-06-02 NOTE — Chronic Care Management (AMB) (Signed)
Chronic Care Management   Note  06/02/2020 Name: DELMAS FAUCETT MRN: [id number] DOB: 08/01/1955   Subjective:  Bufford Buttner is a 64 y.o. year old male who is a primary care patient of Caryl Bis, Angela Adam, MD. The CCM team was consulted for assistance with chronic disease management and care coordination needs.    Connected with patient by telephone for follow up in response to provider referral for pharmacy case management and/or care coordination services.   Review of patient status, including review of consultants reports, laboratory and other test data, was performed as part of comprehensive evaluation and provision of chronic care management services.   SDOH (Social Determinants of Health) assessments and interventions performed:  SDOH Interventions     Most Recent Value  SDOH Interventions  Financial Strain Interventions Intervention Not Indicated       Objective:  Lab Results  Component Value Date   CREATININE 0.83 12/26/2019   CREATININE 1.02 11/26/2017   CREATININE 0.87 06/04/2017    Lab Results  Component Value Date   HGBA1C 9.5 (H) 03/26/2020       Component Value Date/Time   CHOL 161 12/26/2019 0818   TRIG 146.0 12/26/2019 0818   HDL 45.20 12/26/2019 0818   CHOLHDL 4 12/26/2019 0818   VLDL 29.2 12/26/2019 0818   LDLCALC 86 12/26/2019 0818   LDLDIRECT 82.0 05/17/2020 1029    Clinical ASCVD: Yes - hx STEMI   BP Readings from Last 3 Encounters:  03/24/20 110/70  02/19/20 (!) 136/60  12/26/19 120/70    Assessment:   No Known Allergies  Medications Reviewed Today    Reviewed by [name], RPH-CPP (Pharmacist) on 06/02/20 at 1159  Med List Status: <None>  Medication Order Taking? Sig Documenting Provider Last Dose Status Informant  aspirin 81 MG chewable tablet [id number] Yes Chew 1 tablet (81 mg total) by mouth daily. Minna Merritts, MD Taking Active Self  atorvastatin (LIPITOR) 80 MG tablet [id number] Yes TAKE 1 TABLET (80 MG TOTAL) BY  MOUTH DAILY AT 6 PM. [name], MD Taking Active   blood glucose meter kit and supplies KIT [id number] Yes Dispense based on patient and insurance preference. Use once daily as directed. (FOR ICD-10 E11.9). [name], MD Taking Active   carvedilol (COREG) 12.5 MG tablet [id number] Yes TAKE 1 TABLET BY MOUTH 2 TIMES DAILY.(REFILLS NOT COVERED) [name], MD Taking Active   empagliflozin (JARDIANCE) 25 MG TABS tablet [id number] Yes Take 1 tablet (25 mg total) by mouth daily. [name], MD Taking Active   ezetimibe (ZETIA) 10 MG tablet [id number] Yes Take 1 tablet (10 mg total) by mouth daily. [name], MD Taking Active   glucose blood test strip [id number] Yes Use once daily as directed E11.9 [name], MD Taking Active   lisinopril (ZESTRIL) 5 MG tablet [id number] Yes TAKE 1 TABLET BY MOUTH EVERY DAY(REFILLS NOT COVERED). [name], MD Taking Active   metFORMIN (GLUCOPHAGE) 1000 MG tablet [id number] Yes TAKE 1 TABLET (1,000 MG TOTAL) BY MOUTH 2 (TWO) TIMES DAILY WITH A MEAL. [name], MD Taking Active   mupirocin ointment (BACTROBAN) 2 % [id number]  Place 1 application into the nose 3 (three) times daily. Left arm and nose with Q tip do not contaminate tube  Patient not taking: Reported on 03/24/2020   McLean-Scocuzza, Nino Glow, MD  Active   nitroGLYCERIN (NITROSTAT) 0.4 MG SL tablet [id number]  Place 1 tablet (0.4 mg total) under the  tongue every 5 (five) minutes as needed for chest pain.  Patient not taking: Reported on 04/28/2020   McLean-Scocuzza, Nino Glow, MD  Active   Semaglutide,0.25 or 0.5MG/DOS, (OZEMPIC, 0.25 OR 0.5 MG/DOSE,) 2 MG/1.5ML SOPN [id number] Yes Inject 0.1875 mLs (0.25 mg total) into the skin once a week for 28 days, THEN 0.375 mLs (0.5 mg total) once a week. [name], MD Taking Active   spironolactone (ALDACTONE) 25 MG tablet [id number] No TAKE 0.5 TABLETS (12.5 MG TOTAL) BY MOUTH DAILY. IN AM  Patient not  taking: Reported on 06/02/2020   [name], MD Not Taking Active           Patient Care Plan: Pharmacy - Disease Progression Minimization    Problem Identified: Disease Progression   Priority: High  Onset Date: 06/02/2020    Long-Range Goal: Disease Progression Prevented or Minimized   Start Date: 06/02/2020  Expected End Date: 07/28/2020  This Visit's Progress: On track  Priority: High  Note:   Medication and Disease State Review . T2DM: not at goal; metformin 1000 mg BID, Jardiance 25 mg daily, Ozempic 0.5 mg weekly. Prescribed glipizide, but had self-discontinued. Reports diarrhea, discussed switching to ER metformin w/ PCP. Reported fasting and post prandial glucose readings at goal . CAD (hx MI), HF: Not checking BP at home; lisinopril 5 mg daily, carvedilol 12.5 mg BID, NOT taking spironolactone 12.5 mg daily, last fill date in September.  . ASCVD risk reduction/HLD: LDL not at goal <70; atorvastatin 80 mg daily, ezetimibe 10 mg daily; has been on ezetimibe for ~4 weeks; wife is not interested in starting as she feels he has not been on the medication long enough to see benefit . Antiplatelet regimen: aspirin 81 mg daily  Current Barriers:  . Unable to independently achieve goal A1c . Unable to independently achieve goal lipids for secondary prevention  Pharmacist Clinical Goal(s):  Marland Kitchen Over the next 90 days, patient will work with PharmD and primary care provider to address optimized medication management  Interventions: . Comprehensive medication review performed, medication list updated in electronic medical record . Inter-disciplinary care team collaboration (see longitudinal plan of care) . Praised for report of goal blood glucose readings. Agree with switch to extended release metformin to reduce risk of diarrhea. Start metformin XR 500 mg, 2 tabs BID, continue Jardiance 25 mg daily and Ozempic 0.5 mg weekly. If next A1c not at goal, recommend increasing to 1 mg  weekly.  . Reviewed benefit of spironolactone in HF. Restart spironolactone 12.5 mg daily. BMP ordered, lab visit scheduled to evaluate Scr and K in ~5-7 days. . Reviewed goal LDL <70. Discussed that full benefit of ezetimibe may already be seen. However, patient requests to defer discussion of addition of PCSK9i until future date.  Patient Goals/Self-Care Activities . Over the next 90 days, patient will:  - check blood glucose BID, document, and provide at future appointments - take medications as prescribed - report any questions or concerns to provider   Follow Up Plan: Telephone follow up appointment with care management team member scheduled for: ~8 weeks     Goals Addressed              This Visit's Progress     Patient Stated   .  Monitor and Manage My Blood Sugar and Blood Pressure (pt-stated)        Follow Up Date 07/2020   Patient Goals/Self-Care Activities . Over the next 90 days, patient will:  - check  blood glucose BID, document, and provide at future appointments - check blood pressure periodically, document, and provide at future appointments - take medications as prescribed - report any questions or concerns to provider      Catie Darnelle Maffucci, PharmD, [place], [place] Pharmacist [place] [phone]

## 2020-06-02 NOTE — Patient Instructions (Signed)
Michael Jordan,   It was great talking with you today!  We are switching to extended release metformin. Take 2 tablet twice daily. Continue Jardiance 25 mg daily and Ozempic 0.5 mg weekly.   We are re-starting spironolactone 12.5 mg daily. Make sure you come in on the 17th for the lab check. Continue lisinopril and carvedilol as you are taking them at this time.   We may have already seen the full benefit of the addition of the ezetimibe to your cholesterol. Continue taking atorvastatin and ezetimibe at this time, and we will discuss the possible addition of other medications in the future.   Catie Feliz Beam, PharmD, [place], CPP Clinical Pharmacist [facility] [employer] [phone]    Visit Information  Goals Addressed              This Visit's Progress     Patient Stated   .  Monitor and Manage My Blood Sugar and Blood Pressure (pt-stated)        Follow Up Date 07/2020   Patient Goals/Self-Care Activities . Over the next 90 days, patient will:  - check blood glucose BID, document, and provide at future appointments - check blood pressure periodically, document, and provide at future appointments - take medications as prescribed - report any questions or concerns to provider       The patient verbalized understanding of instructions provided today and agreed to receive a mailed copy of patient instruction and/or educational materials.  Telephone follow up appointment with care management team member scheduled for: ~ 8 weeks  Catie Feliz Beam, PharmD, [place], CPP Clinical Pharmacist [facility] [employer] [phone]

## 2020-06-03 ENCOUNTER — Other Ambulatory Visit: Payer: Self-pay | Admitting: Family Medicine

## 2020-06-03 MED ORDER — METFORMIN HCL ER 500 MG PO TB24: 500.0000 mg | ORAL_TABLET | Freq: Two times a day (BID) | ORAL | 1 refills | Status: DC

## 2020-06-09 ENCOUNTER — Other Ambulatory Visit (INDEPENDENT_AMBULATORY_CARE_PROVIDER_SITE_OTHER): Payer: BC Managed Care – PPO

## 2020-06-09 ENCOUNTER — Other Ambulatory Visit: Payer: Self-pay

## 2020-06-09 DIAGNOSIS — E785 Hyperlipidemia, unspecified: Secondary | ICD-10-CM | POA: Diagnosis not present

## 2020-06-09 LAB — LDL CHOLESTEROL, DIRECT: Direct LDL: 75 mg/dL

## 2020-06-25 ENCOUNTER — Ambulatory Visit (INDEPENDENT_AMBULATORY_CARE_PROVIDER_SITE_OTHER): Payer: BC Managed Care – PPO | Admitting: Family Medicine

## 2020-06-25 ENCOUNTER — Other Ambulatory Visit: Payer: Self-pay

## 2020-06-25 ENCOUNTER — Encounter: Payer: Self-pay | Admitting: Family Medicine

## 2020-06-25 VITALS — BP 130/80 | HR 88 | Temp 97.7°F | Ht 71.0 in | Wt 290.8 lb

## 2020-06-25 DIAGNOSIS — E1159 Type 2 diabetes mellitus with other circulatory complications: Secondary | ICD-10-CM | POA: Diagnosis not present

## 2020-06-25 DIAGNOSIS — E785 Hyperlipidemia, unspecified: Secondary | ICD-10-CM | POA: Diagnosis not present

## 2020-06-25 DIAGNOSIS — I10 Essential (primary) hypertension: Secondary | ICD-10-CM

## 2020-06-25 DIAGNOSIS — E1165 Type 2 diabetes mellitus with hyperglycemia: Secondary | ICD-10-CM | POA: Diagnosis not present

## 2020-06-25 DIAGNOSIS — IMO0002 Reserved for concepts with insufficient information to code with codable children: Secondary | ICD-10-CM

## 2020-06-25 MED ORDER — METFORMIN HCL ER 500 MG PO TB24: 500.0000 mg | ORAL_TABLET | Freq: Two times a day (BID) | ORAL | 1 refills | Status: DC

## 2020-06-25 MED ORDER — LISINOPRIL 10 MG PO TABS: 10.0000 mg | ORAL_TABLET | Freq: Every day | ORAL | 1 refills | Status: DC

## 2020-06-25 NOTE — Assessment & Plan Note (Signed)
Above goal. We will increase his lisinopril to 10 mg daily. He will return for labs in 7 days.  Continue carvedilol 12.5 mg twice daily as well.

## 2020-06-25 NOTE — Progress Notes (Signed)
[name], MD Phone: [phone]  Michael Jordan is a 64 y.o. male who presents today for f/u.  HYPERTENSION  Disease Monitoring  Home BP Monitoring 120-140 systolic Chest pain- no    Dyspnea- no Medications  Compliance-  Taking lisinopril, coreg.   Edema- no  DIABETES Disease Monitoring: Blood Sugar ranges-110-130 Polyuria/phagia/dipsia- no      Optho- has an appointment in late December Medications: Compliance- taking jardiance and metformin, has not started the XR metformin due to a pharmacy issue Hypoglycemic symptoms- no  HLD: on zetia and lipitor. LDL has trended down. He declined PCSK9 inhibitors.      Social History   Tobacco Use  Smoking Status Former Smoker  . Packs/day: 0.50  . Years: 44.00  . Pack years: 22.00  . Types: Cigarettes  . Quit date: 06/02/2015  . Years since quitting: 5.0  Smokeless Tobacco Never Used     ROS see history of present illness  Objective  Physical Exam Vitals:   06/25/20 1106  BP: 130/80  Pulse: 88  Temp: 97.7 F (36.5 C)  SpO2: 96%    BP Readings from Last 3 Encounters:  06/25/20 130/80  03/24/20 110/70  02/19/20 (!) 136/60   Wt Readings from Last 3 Encounters:  06/25/20 290 lb 12.8 oz (131.9 kg)  03/24/20 288 lb (130.6 kg)  02/19/20 (!) 287 lb 4 oz (130.3 kg)    Physical Exam Constitutional:      General: He is not in acute distress.    Appearance: He is not diaphoretic.  Cardiovascular:     Rate and Rhythm: Normal rate and regular rhythm.     Heart sounds: Normal heart sounds.  Pulmonary:     Effort: Pulmonary effort is normal.     Breath sounds: Normal breath sounds.  Musculoskeletal:     Right lower leg: No edema.     Left lower leg: No edema.  Skin:    General: Skin is warm and dry.  Neurological:     Mental Status: He is alert.      Assessment/Plan: Please see individual problem list.  Problem List Items Addressed This Visit    Essential hypertension    Above goal. We will increase  his lisinopril to 10 mg daily. He will return for labs in 7 days.  Continue carvedilol 12.5 mg twice daily as well.      Relevant Medications   lisinopril (ZESTRIL) 10 MG tablet   Other Relevant Orders   Basic Metabolic Panel (BMET)   Hyperlipidemia    Patient refuses injectable medication for this.  He will continue Lipitor 80 mg once daily and Zetia 10 mg once daily.      Relevant Medications   lisinopril (ZESTRIL) 10 MG tablet   Uncontrolled type 2 diabetes mellitus with circulatory disorder (HCC) - Primary    Control seems to be better.  He will continue Jardiance 25 mg once daily.  He will start on Metformin extended release 500 mg twice daily.  A1c to be checked in 1 week.      Relevant Medications   lisinopril (ZESTRIL) 10 MG tablet   metFORMIN (GLUCOPHAGE XR) 500 MG 24 hr tablet   Other Relevant Orders   HgB A1c       Health Maintenance: Declines Covid and flu vaccines.   This visit occurred during the SARS-CoV-2 public health emergency.  Safety protocols were in place, including screening questions prior to the visit, additional usage of staff PPE, and extensive cleaning of exam  room while observing appropriate contact time as indicated for disinfecting solutions.    Tommi Rumps, MD [place]

## 2020-06-25 NOTE — Patient Instructions (Signed)
Nice to see you. We are going to increase your lisinopril dose.  You can take two of your 5 mg tablets once daily until you run out of your current supply and then start on the 10 mg tablet once daily.  Your return for labs in 7 days.

## 2020-06-25 NOTE — Assessment & Plan Note (Signed)
Patient refuses injectable medication for this.  He will continue Lipitor 80 mg once daily and Zetia 10 mg once daily.

## 2020-06-25 NOTE — Assessment & Plan Note (Signed)
Control seems to be better.  He will continue Jardiance 25 mg once daily.  He will start on Metformin extended release 500 mg twice daily.  A1c to be checked in 1 week.

## 2020-07-05 ENCOUNTER — Other Ambulatory Visit (INDEPENDENT_AMBULATORY_CARE_PROVIDER_SITE_OTHER): Payer: BC Managed Care – PPO

## 2020-07-05 ENCOUNTER — Other Ambulatory Visit: Payer: Self-pay

## 2020-07-05 DIAGNOSIS — I10 Essential (primary) hypertension: Secondary | ICD-10-CM

## 2020-07-05 DIAGNOSIS — E1165 Type 2 diabetes mellitus with hyperglycemia: Secondary | ICD-10-CM | POA: Diagnosis not present

## 2020-07-05 DIAGNOSIS — E1159 Type 2 diabetes mellitus with other circulatory complications: Secondary | ICD-10-CM

## 2020-07-05 DIAGNOSIS — IMO0002 Reserved for concepts with insufficient information to code with codable children: Secondary | ICD-10-CM

## 2020-07-05 LAB — BASIC METABOLIC PANEL
BUN: 20 mg/dL (ref 6–23)
CO2: 26 mEq/L (ref 19–32)
Calcium: 9.3 mg/dL (ref 8.4–10.5)
Chloride: 100 mEq/L (ref 96–112)
Creatinine, Ser: 0.92 mg/dL (ref 0.40–1.50)
GFR: 87.21 mL/min (ref >60.00)
Glucose, Bld: 220 mg/dL — ABNORMAL HIGH (ref 70–99)
Potassium: 4.6 mEq/L (ref 3.5–5.1)
Sodium: 133 mEq/L — ABNORMAL LOW (ref 135–145)

## 2020-07-05 LAB — HEMOGLOBIN A1C: Hgb A1c MFr Bld: 9.5 % — ABNORMAL HIGH (ref 4.6–6.5)

## 2020-07-06 ENCOUNTER — Other Ambulatory Visit: Payer: Self-pay | Admitting: Family Medicine

## 2020-07-06 DIAGNOSIS — E1165 Type 2 diabetes mellitus with hyperglycemia: Secondary | ICD-10-CM

## 2020-07-06 DIAGNOSIS — I10 Essential (primary) hypertension: Secondary | ICD-10-CM

## 2020-07-21 LAB — HM DIABETES EYE EXAM

## 2020-08-01 ENCOUNTER — Other Ambulatory Visit: Payer: Self-pay | Admitting: Family Medicine

## 2020-08-01 DIAGNOSIS — I10 Essential (primary) hypertension: Secondary | ICD-10-CM

## 2020-08-01 DIAGNOSIS — E1165 Type 2 diabetes mellitus with hyperglycemia: Secondary | ICD-10-CM

## 2020-08-05 ENCOUNTER — Telehealth: Payer: BC Managed Care – PPO

## 2020-08-05 ENCOUNTER — Telehealth: Payer: Self-pay | Admitting: Pharmacist

## 2020-08-05 NOTE — Telephone Encounter (Signed)
  Chronic Care Management   Note  08/05/2020 Name: Michael Jordan MRN: [id number] DOB: [birth year]   Attempted to contact patient for scheduled appointment for medication management support. Left HIPAA compliant message for patient to return my call at their convenience.    Plan: - If I do not hear back from the patient by end of business today, will collaborate with Care Guide to outreach to schedule follow up with me   Catie Feliz Beam, PharmD, [place], CPP Clinical Pharmacist [employer] [facility] [employer] [phone]

## 2020-08-10 ENCOUNTER — Telehealth: Payer: Self-pay

## 2020-08-10 NOTE — Chronic Care Management (AMB) (Signed)
  Care Management   Note  08/10/2020 Name: CYNTHIA STAINBACK MRN: [id number] DOB: 03/12/1956  Genella Rife is a 65 y.o. year old male who is a primary care patient of [name], MD and is actively engaged with the care management team. I reached out to Genella Rife by phone today to assist with re-scheduling a follow up visit with the Pharmacist  Follow up plan: Unsuccessful telephone outreach attempt made. A HIPAA compliant phone message was left for the patient providing contact information and requesting a return call.  The care management team will reach out to the patient again over the next 7 days.  If patient returns call to provider office, please advise to call Embedded Care Management Care Guide Penne Lash  at [phone]  Penne Lash, RMA Care Guide, Embedded Care Coordination [facility]  [place], [place] [address] Direct Dial: [phone] [email or web address]

## 2020-08-17 ENCOUNTER — Ambulatory Visit: Payer: BC Managed Care – PPO | Admitting: Cardiovascular Disease

## 2020-08-26 ENCOUNTER — Other Ambulatory Visit: Payer: Self-pay | Admitting: Family Medicine

## 2020-08-26 DIAGNOSIS — E1165 Type 2 diabetes mellitus with hyperglycemia: Secondary | ICD-10-CM

## 2020-08-26 NOTE — Chronic Care Management (AMB) (Signed)
  Care Management   Note  08/26/2020 Name: Michael Jordan MRN: [id number] DOB: May 03, 1956  Michael Jordan is a 65 y.o. year old male who is a primary care patient of [name], MD and is actively engaged with the care management team. I reached out to Michael Jordan by phone today to assist with re-scheduling a follow up visit with the Pharmacist  Follow up plan: Patient declines rescheduling a follow up at this time. States that he is doing well and if they need anything he will reach out  Appropriate care team members and provider have been notified via electronic communication.   Penne Lash, RMA Care Guide, Embedded Care Coordination [facility]  [place], [place] [address] Direct Dial: [phone] [email or web address]

## 2020-08-26 NOTE — Telephone Encounter (Signed)
Spoke with patients wife and she declines rescheduling states that patient is doing well

## 2020-09-23 ENCOUNTER — Other Ambulatory Visit: Payer: Self-pay

## 2020-09-23 ENCOUNTER — Encounter: Payer: Self-pay | Admitting: Cardiovascular Disease

## 2020-09-23 ENCOUNTER — Ambulatory Visit (INDEPENDENT_AMBULATORY_CARE_PROVIDER_SITE_OTHER): Payer: BC Managed Care – PPO | Admitting: Cardiovascular Disease

## 2020-09-23 VITALS — BP 130/82 | HR 76 | Ht 71.0 in | Wt 293.0 lb

## 2020-09-23 DIAGNOSIS — I10 Essential (primary) hypertension: Secondary | ICD-10-CM

## 2020-09-23 DIAGNOSIS — I255 Ischemic cardiomyopathy: Secondary | ICD-10-CM | POA: Diagnosis not present

## 2020-09-23 DIAGNOSIS — I2510 Atherosclerotic heart disease of native coronary artery without angina pectoris: Secondary | ICD-10-CM | POA: Diagnosis not present

## 2020-09-23 DIAGNOSIS — E785 Hyperlipidemia, unspecified: Secondary | ICD-10-CM

## 2020-09-23 MED ORDER — LISINOPRIL 10 MG PO TABS: 10.0000 mg | ORAL_TABLET | Freq: Every day | ORAL | 3 refills | Status: DC

## 2020-09-23 NOTE — Progress Notes (Signed)
Cardiology Office Note   Date:  09/23/2020   ID:  Michael Jordan, DOB 12-Jan-1956, MRN [id number]  PCP:  [name], MD  Cardiologist:   Kathlyn Sacramento, MD   Chief Complaint  Patient presents with  . Follow-up    6 months       History of Present Illness:  Michael Jordan is a 65 y.o. male who presents for a follow-up visit regarding coronary artery disease.  He was hospitalized in March 2017 with anterior ST elevation myocardial infarction. Cardiac catheterization showed occluded proximal LAD with mild disease affecting the diagonals, chronically occluded right coronary artery with left to right collaterals and moderate left circumflex disease. He underwent successful angioplasty and drug-eluting stent placement in the proximal LAD. Ejection fraction was 40-45% by LV gram and 35-40% by echocardiogram.  He has other chronic medical conditions that include morbid obesity, hypertension, hyperlipidemia and type 2 diabetes. He quit Smoking in November 2016  Most recent echocardiogram in May of last year showed an EF of 45 to 50% with mild LVH.    He has been doing well with no recent chest pain, shortness of breath or palpitations.    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  Past Medical History:  Diagnosis Date  . Abnormal stress test   . CAD (coronary artery disease)    a. 09/2015 Ant STEMI/PCI: LAD 100p (3.0x18 Xience Alpine DES), RCA 80p/158m(CTO)->fills via collats, EF 45-50%; b. 10/2015 MV: fixed septal/inf defects w/ dramatic ECG changes; c. 10/2015 Cath: LM nl, LAD patent stent, D1 40, SP1 40, LCX 414mRCA 80p/10040mF 45-50%-->Med Rx.  . Cardiomyopathy, ischemic    a. 09/2015 Echo: EF 35-40%, sev apical, periapical, antapical HK, mild to mod ant, inf HK, Gr1 DD.  . HMarland Kitchenstory of tobacco abuse    a. Quit 05/2015.  . HMarland Kitchenperlipidemia   . Morbid obesity ([facility] . Tobacco abuse 10/01/2015  . Type II diabetes  mellitus ([facility]   Past Surgical History:  Procedure Laterality Date  . CARDIAC CATHETERIZATION N/A 09/30/2015   Procedure: Left Heart Cath and Coronary Angiography;  Surgeon: MuhWellington HampshireD;  [facility];  Service: Cardiovascular;  Laterality: N/A;  . CARDIAC CATHETERIZATION N/A 09/30/2015   Procedure: Coronary Stent Intervention;  Surgeon: MuhWellington HampshireD;  [facility];  Service: Cardiovascular;  Laterality: N/A;  . CARDIAC CATHETERIZATION N/A 11/11/2015   Procedure: Left Heart Cath and Coronary Angiography;  Surgeon: MuhWellington HampshireD;  [facility];  Service: Cardiovascular;  Laterality: N/A;  . TONSILLECTOMY       Current Outpatient Medications  Medication Sig Dispense Refill  . aspirin 81 MG chewable tablet Chew 1 tablet (81 mg total) by mouth daily.    . aMarland Kitchenorvastatin (LIPITOR) 80 MG tablet TAKE 1 TABLET (80 MG TOTAL) BY MOUTH DAILY AT 6 PM. 90 tablet 1  . blood glucose meter kit and supplies KIT Dispense based on patient and insurance preference. Use once daily as directed. (FOR ICD-10 E11.9). 1 each 0  . carvedilol (COREG) 12.5 MG tablet TAKE 1 TABLET BY MOUTH 2 TIMES DAILY. 180 tablet 1  . empagliflozin (JARDIANCE) 25 MG TABS tablet Take 1 tablet (25 mg total) by mouth daily. 90 tablet 3  . ezetimibe (ZETIA) 10 MG tablet Take 1 tablet (10 mg total) by mouth daily. 90 tablet 3  .  gentamicin (GARAMYCIN) 0.3 % ophthalmic solution     . glucose blood test strip Use once daily as directed E11.9 100 each 1  . metFORMIN (GLUCOPHAGE XR) 500 MG 24 hr tablet Take 1 tablet (500 mg total) by mouth 2 (two) times daily with a meal. 180 tablet 1  . nitroGLYCERIN (NITROSTAT) 0.4 MG SL tablet Place 1 tablet (0.4 mg total) under the tongue every 5 (five) minutes as needed for chest pain. 25 tablet 0  . Semaglutide (OZEMPIC, 0.25 OR 0.5 MG/DOSE, [place]) Inject 0.5 mg into the skin once a week.    Marland Kitchen lisinopril (ZESTRIL) 10 MG tablet Take 1  tablet (10 mg total) by mouth daily. 90 tablet 3   No current facility-administered medications for this visit.    Allergies:   Patient has no known allergies.    Social History:  The patient  reports that he quit smoking about 5 years ago. His smoking use included cigarettes. He has a 22.00 pack-year smoking history. He has never used smokeless tobacco. He reports current alcohol use. He reports that he does not use drugs.   Family History:  The patient's family history includes Diabetes in his mother; Heart attack in his father, mother, and paternal grandfather.    ROS:  Please see the history of present illness.   Otherwise, review of systems are positive for none.   All other systems are reviewed and negative.    PHYSICAL EXAM: VS:  BP 130/82   Pulse 76   Ht _0  (1.803 m)   Wt 293 lb (132.9 kg)   BMI 40.87 kg/m  , BMI Body mass index is 40.87 kg/m. GEN: Well nourished, well developed, in no acute distress  HEENT: normal  Neck: no JVD, carotid bruits, or masses Cardiac: RRR; no murmurs, rubs, or gallops,no edema  Respiratory:  clear to auscultation bilaterally, normal work of breathing GI: soft, nontender, nondistended, + BS MS: no deformity or atrophy  Skin: warm and dry, no rash Neuro:  Strength and sensation are intact Psych: euthymic mood, full affect   EKG:  EKG is ordered today. EKG showed normal sinus rhythm with no significant ST or T wave changes.  Incomplete right bundle branch block.  Low voltage.   Recent Labs: 12/26/2019: ALT 15; Hemoglobin 14.0; Platelets 391.0 07/05/2020: BUN 20; Creatinine, Ser 0.92; Potassium 4.6; Sodium 133    Lipid Panel    Component Value Date/Time   CHOL 161 12/26/2019 0818   TRIG 146.0 12/26/2019 0818   HDL 45.20 12/26/2019 0818   CHOLHDL 4 12/26/2019 0818   VLDL 29.2 12/26/2019 0818   LDLCALC 86 12/26/2019 0818   LDLDIRECT 75.0 06/09/2020 1105      Wt Readings from Last 3 Encounters:  09/23/20 293 lb (132.9 kg)   06/25/20 290 lb 12.8 oz (131.9 kg)  03/24/20 288 lb (130.6 kg)        ASSESSMENT AND PLAN:  1.  Coronary artery disease involving native coronary arteries without angina: He is doing very well with no anginal symptoms.  Continue aspirin indefinitely.  2. Hyperlipidemia: Continue treatment with atorvastatin and Zetia.  Most recent lipid profile showed an LDL of 75.  I discussed other options of lowering his LDL including PCSK9 inhibitors or bempedoic acid.  He feels that his cholesterol will come down further as he improves his diet and does not want to make additional changes at the present time.  I think that is reasonable given that his LDL was close to target  of less than 70.  3. Ischemic cardiomyopathy: Most recent echo showed improvement in ejection fraction to 45 to 50%.  Continue treatment with carvedilol, lisinopril and spironolactone.   4. Essential hypertension: Blood pressure is controlled on current medications.    Disposition:   FU with me in 12 months  Signed,  Kathlyn Sacramento, MD  09/23/2020 4:55 PM    [facility]

## 2020-09-23 NOTE — Patient Instructions (Addendum)
Medication Instructions:  Your physician recommends that you continue on your current medications as directed. Please refer to the Current Medication list given to you today.  *If you need a refill on your cardiac medications before your next appointment, please call your pharmacy*   Lab Work: None ordered If you have labs (blood work) drawn today and your tests are completely normal, you will receive your results only by: . MyChart Message (if you have MyChart) OR . A paper copy in the mail If you have any lab test that is abnormal or we need to change your treatment, we will call you to review the results.   Testing/Procedures: None ordered   Follow-Up: At [facility], you and your health needs are our priority.  As part of our continuing mission to provide you with exceptional heart care, we have created designated Provider Care Teams.  These Care Teams include your primary Cardiologist (physician) and Advanced Practice Providers (APPs -  Physician Assistants and Nurse Practitioners) who all work together to provide you with the care you need, when you need it.  We recommend signing up for the patient portal called "MyChart".  Sign up information is provided on this After Visit Summary.  MyChart is used to connect with patients for Virtual Visits (Telemedicine).  Patients are able to view lab/test results, encounter notes, upcoming appointments, etc.  Non-urgent messages can be sent to your provider as well.   To learn more about what you can do with MyChart, go to [email or web address].    Your next appointment:   Your physician wants you to follow-up in: 1 year You will receive a reminder letter in the mail two months in advance. If you don't receive a letter, please call our office to schedule the follow-up appointment.   The format for your next appointment:   In Person  Provider:   You may see Muhammad Arida, MD or one of the following Advanced Practice Providers on your  designated Care Team:    Christopher Berge, NP  Ryan Dunn, PA-C  Jacquelyn Visser, PA-C  Cadence Furth, PA-C  Caitlin Olheiser, NP    Other Instructions N/A  

## 2020-09-24 ENCOUNTER — Ambulatory Visit: Payer: BC Managed Care – PPO | Admitting: Family Medicine

## 2020-09-27 ENCOUNTER — Other Ambulatory Visit: Payer: Self-pay | Admitting: Family Medicine

## 2020-09-28 ENCOUNTER — Ambulatory Visit: Payer: BC Managed Care – PPO | Admitting: Family Medicine

## 2020-10-21 ENCOUNTER — Other Ambulatory Visit: Payer: Self-pay

## 2020-10-25 ENCOUNTER — Ambulatory Visit (INDEPENDENT_AMBULATORY_CARE_PROVIDER_SITE_OTHER): Payer: BC Managed Care – PPO | Admitting: Family Medicine

## 2020-10-25 ENCOUNTER — Encounter: Payer: Self-pay | Admitting: Family Medicine

## 2020-10-25 ENCOUNTER — Other Ambulatory Visit: Payer: Self-pay

## 2020-10-25 DIAGNOSIS — H1032 Unspecified acute conjunctivitis, left eye: Secondary | ICD-10-CM

## 2020-10-25 DIAGNOSIS — I10 Essential (primary) hypertension: Secondary | ICD-10-CM

## 2020-10-25 DIAGNOSIS — IMO0002 Reserved for concepts with insufficient information to code with codable children: Secondary | ICD-10-CM

## 2020-10-25 DIAGNOSIS — E1165 Type 2 diabetes mellitus with hyperglycemia: Secondary | ICD-10-CM

## 2020-10-25 DIAGNOSIS — E1159 Type 2 diabetes mellitus with other circulatory complications: Secondary | ICD-10-CM | POA: Diagnosis not present

## 2020-10-25 DIAGNOSIS — H109 Unspecified conjunctivitis: Secondary | ICD-10-CM | POA: Insufficient documentation

## 2020-10-25 LAB — POCT GLYCOSYLATED HEMOGLOBIN (HGB A1C): Hemoglobin A1C: 11.6 % — AB (ref 4.0–5.6)

## 2020-10-25 NOTE — Assessment & Plan Note (Signed)
At goal.  He will continue carvedilol 12.5 mg twice daily and lisinopril 10 mg daily.

## 2020-10-25 NOTE — Patient Instructions (Signed)
Nice to see you. We will get an A1c today. Please decrease your whole milk intake and soda intake.

## 2020-10-25 NOTE — Assessment & Plan Note (Signed)
Check A1c.  He will continue Jardiance 25 mg daily, Metformin 500 mg twice daily, and Ozempic 0.5 mg once weekly.

## 2020-10-25 NOTE — Assessment & Plan Note (Signed)
Appears to have improved.  He has some watery discharge.  Possibly related to allergies.  They will monitor.

## 2020-10-25 NOTE — Progress Notes (Signed)
Michael Rumps, MD Phone: [phone]  ARMOND Jordan is a 65 y.o. male who presents today for f/u.  DIABETES Disease Monitoring: Blood Sugar ranges-not checking Polyuria/phagia/dipsia- no      Optho- UTD Medications: Compliance- taking jardiance, metformin, ozempic Hypoglycemic symptoms- no Patient drinks several sodas per day and drinks lots of whole milk.  HYPERTENSION  Disease Monitoring  Home BP Monitoring not checking Chest pain- no    Dyspnea- no Medications  Compliance-  Taking coreg, lisinopril.   Edema- occasional if he is sitting for a long period of time  Conjunctivitis: Patient saw the eye doctor recently and prescribed gentamicin eyedrops.  They note they followed up with the eye doctor and they advised everything looked okay.  Has had watery discharge from both eyes recently that may be related to allergies.     Social History   Tobacco Use  Smoking Status Former Smoker  . Packs/day: 0.50  . Years: 44.00  . Pack years: 22.00  . Types: Cigarettes  . Quit date: 06/02/2015  . Years since quitting: 5.4  Smokeless Tobacco Never Used    Current Outpatient Medications on File Prior to Visit  Medication Sig Dispense Refill  . aspirin 81 MG chewable tablet Chew 1 tablet (81 mg total) by mouth daily.    Marland Kitchen atorvastatin (LIPITOR) 80 MG tablet TAKE 1 TABLET (80 MG TOTAL) BY MOUTH DAILY AT 6 PM. 90 tablet 1  . blood glucose meter kit and supplies KIT Dispense based on patient and insurance preference. Use once daily as directed. (FOR ICD-10 E11.9). 1 each 0  . carvedilol (COREG) 12.5 MG tablet TAKE 1 TABLET BY MOUTH 2 TIMES DAILY. 180 tablet 1  . empagliflozin (JARDIANCE) 25 MG TABS tablet Take 1 tablet (25 mg total) by mouth daily. 90 tablet 3  . ezetimibe (ZETIA) 10 MG tablet Take 1 tablet (10 mg total) by mouth daily. 90 tablet 3  . glucose blood test strip Use once daily as directed E11.9 100 each 1  . lisinopril (ZESTRIL) 10 MG tablet Take 1 tablet (10 mg total)  by mouth daily. 90 tablet 3  . metFORMIN (GLUCOPHAGE XR) 500 MG 24 hr tablet Take 1 tablet (500 mg total) by mouth 2 (two) times daily with a meal. 180 tablet 1  . nitroGLYCERIN (NITROSTAT) 0.4 MG SL tablet Place 1 tablet (0.4 mg total) under the tongue every 5 (five) minutes as needed for chest pain. 25 tablet 0  . Semaglutide (OZEMPIC, 0.25 OR 0.5 MG/DOSE, [place]) Inject 0.5 mg into the skin once a week.     No current facility-administered medications on file prior to visit.     ROS see history of present illness  Objective  Physical Exam Vitals:   10/25/20 1541  BP: 120/70  Pulse: 78  Temp: 98.2 F (36.8 C)  SpO2: 98%    BP Readings from Last 3 Encounters:  10/25/20 120/70  09/23/20 130/82  06/25/20 130/80   Wt Readings from Last 3 Encounters:  10/25/20 291 lb (132 kg)  09/23/20 293 lb (132.9 kg)  06/25/20 290 lb 12.8 oz (131.9 kg)    Physical Exam Constitutional:      General: He is not in acute distress.    Appearance: He is not diaphoretic.  Eyes:     Conjunctiva/sclera: Conjunctivae normal.     Pupils: Pupils are equal, round, and reactive to light.     Comments: Very mild watery discharge  Cardiovascular:     Rate and Rhythm: Normal rate  and regular rhythm.     Heart sounds: Normal heart sounds.  Pulmonary:     Effort: Pulmonary effort is normal.     Breath sounds: Normal breath sounds.  Skin:    General: Skin is warm and dry.  Neurological:     Mental Status: He is alert.      Assessment/Plan: Please see individual problem list.  Problem List Items Addressed This Visit    Conjunctivitis    Appears to have improved.  He has some watery discharge.  Possibly related to allergies.  They will monitor.      Essential hypertension    At goal.  He will continue carvedilol 12.5 mg twice daily and lisinopril 10 mg daily.      Uncontrolled type 2 diabetes mellitus with circulatory disorder (HCC)    Check A1c.  He will continue Jardiance 25 mg daily,  Metformin 500 mg twice daily, and Ozempic 0.5 mg once weekly.      Relevant Orders   POCT HgB A1C (Completed)      This visit occurred during the SARS-CoV-2 public health emergency.  Safety protocols were in place, including screening questions prior to the visit, additional usage of staff PPE, and extensive cleaning of exam room while observing appropriate contact time as indicated for disinfecting solutions.    Michael Rumps, MD [place]

## 2020-11-01 ENCOUNTER — Other Ambulatory Visit: Payer: Self-pay | Admitting: Family Medicine

## 2020-11-01 MED ORDER — OZEMPIC (1 MG/DOSE) 2 MG/1.5ML ~~LOC~~ SOPN: 1.0000 mg | PEN_INJECTOR | SUBCUTANEOUS | 1 refills | Status: DC

## 2020-12-07 IMAGING — DX DG CHEST 2V
2 series · 2 of 2 positions shown · non-contrast
Comparison: 10/20/2015

CLINICAL DATA: Shortness of breath with exertion

EXAM:
CHEST - 2 VIEW

[chest pa]
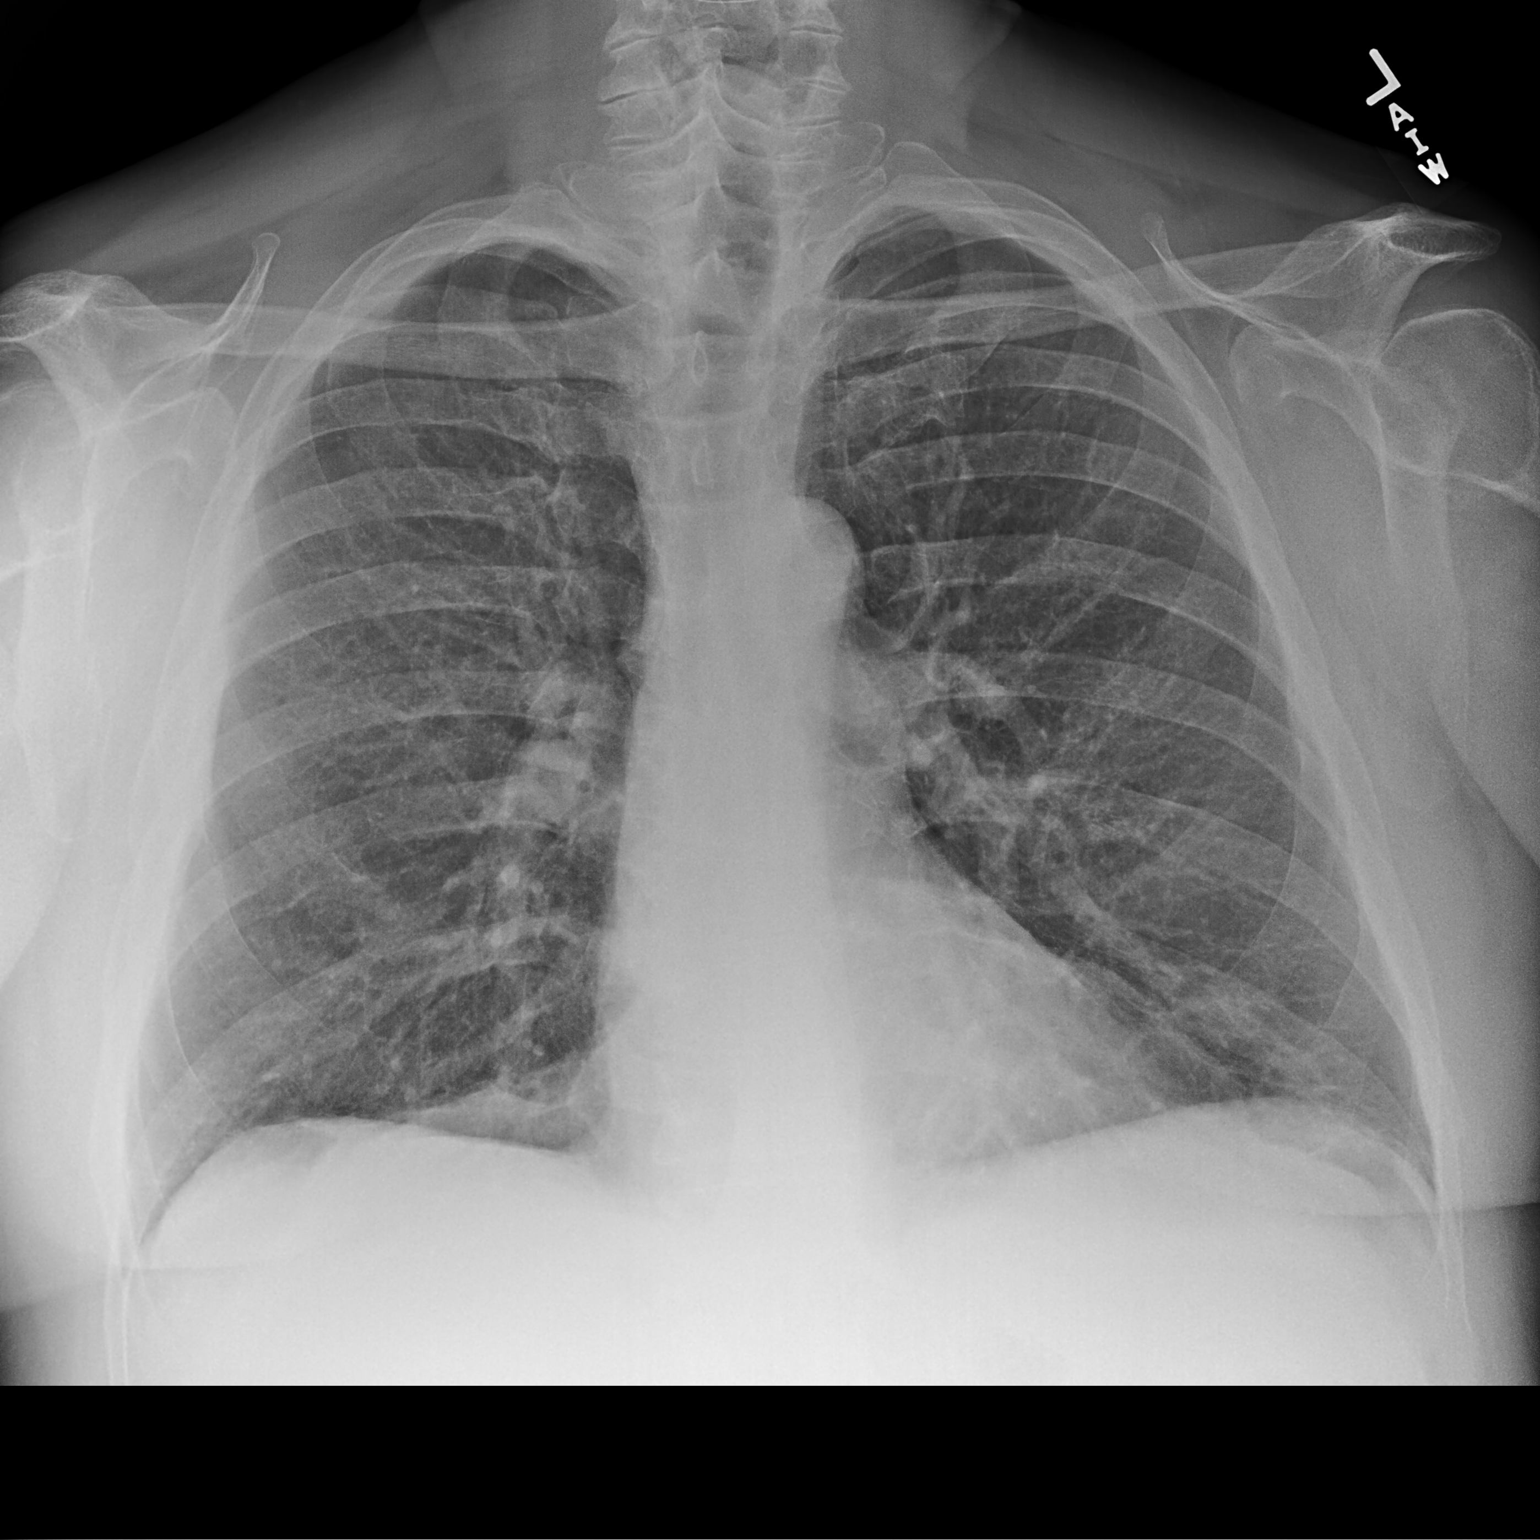

[chest lat]
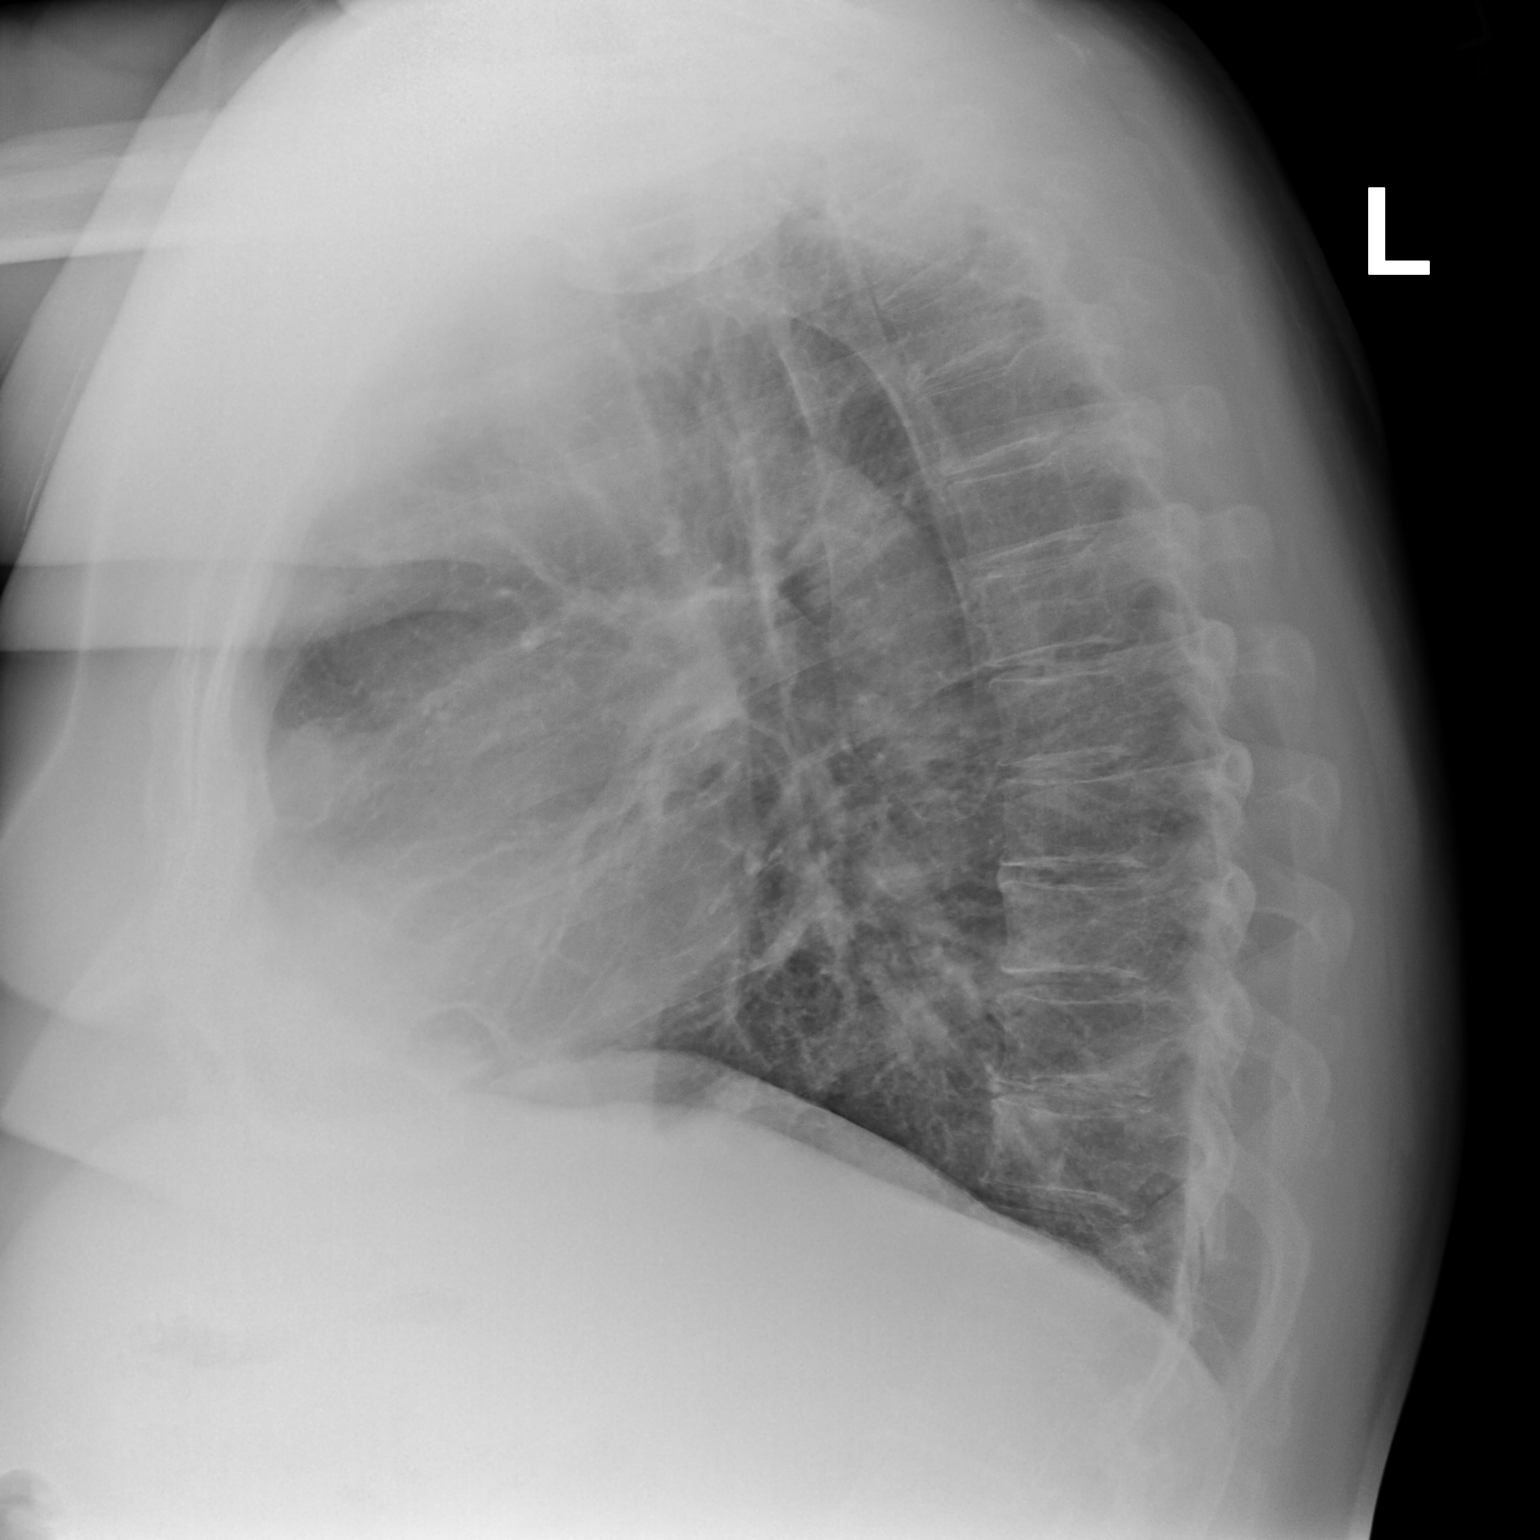

[2 of 2 positions shown; findings below may reference images not displayed]

FINDINGS: Lungs are clear.  No pleural effusion or pneumothorax.

The heart is normal in size.

Mild degenerative changes of the lower thoracic spine.
IMPRESSION: Normal chest radiographs.

## 2021-01-13 ENCOUNTER — Other Ambulatory Visit: Payer: Self-pay | Admitting: Family Medicine

## 2021-01-13 DIAGNOSIS — E1165 Type 2 diabetes mellitus with hyperglycemia: Secondary | ICD-10-CM

## 2021-01-13 DIAGNOSIS — I10 Essential (primary) hypertension: Secondary | ICD-10-CM

## 2021-01-14 ENCOUNTER — Other Ambulatory Visit: Payer: Self-pay | Admitting: Family Medicine

## 2021-01-14 DIAGNOSIS — IMO0002 Reserved for concepts with insufficient information to code with codable children: Secondary | ICD-10-CM

## 2021-01-14 DIAGNOSIS — E1165 Type 2 diabetes mellitus with hyperglycemia: Secondary | ICD-10-CM

## 2021-01-17 MED ORDER — OZEMPIC (1 MG/DOSE) 2 MG/1.5ML ~~LOC~~ SOPN: 1.0000 mg | PEN_INJECTOR | SUBCUTANEOUS | 1 refills | Status: DC

## 2021-01-19 ENCOUNTER — Other Ambulatory Visit: Payer: Self-pay | Admitting: Family Medicine

## 2021-02-02 ENCOUNTER — Ambulatory Visit: Payer: BC Managed Care – PPO | Admitting: Family Medicine

## 2021-02-02 ENCOUNTER — Other Ambulatory Visit: Payer: Self-pay | Admitting: Family Medicine

## 2021-02-02 DIAGNOSIS — I10 Essential (primary) hypertension: Secondary | ICD-10-CM

## 2021-02-10 ENCOUNTER — Encounter: Payer: Self-pay | Admitting: Family Medicine

## 2021-02-10 ENCOUNTER — Ambulatory Visit (INDEPENDENT_AMBULATORY_CARE_PROVIDER_SITE_OTHER): Payer: BC Managed Care – PPO | Admitting: Family Medicine

## 2021-02-10 ENCOUNTER — Other Ambulatory Visit: Payer: Self-pay

## 2021-02-10 VITALS — BP 140/80 | HR 85 | Temp 98.2°F | Ht 71.0 in | Wt 286.2 lb

## 2021-02-10 DIAGNOSIS — E1159 Type 2 diabetes mellitus with other circulatory complications: Secondary | ICD-10-CM

## 2021-02-10 DIAGNOSIS — E785 Hyperlipidemia, unspecified: Secondary | ICD-10-CM

## 2021-02-10 DIAGNOSIS — IMO0002 Reserved for concepts with insufficient information to code with codable children: Secondary | ICD-10-CM

## 2021-02-10 DIAGNOSIS — R3914 Feeling of incomplete bladder emptying: Secondary | ICD-10-CM

## 2021-02-10 DIAGNOSIS — E1165 Type 2 diabetes mellitus with hyperglycemia: Secondary | ICD-10-CM | POA: Diagnosis not present

## 2021-02-10 DIAGNOSIS — I10 Essential (primary) hypertension: Secondary | ICD-10-CM | POA: Diagnosis not present

## 2021-02-10 LAB — COMPREHENSIVE METABOLIC PANEL
ALT: 17 U/L (ref 0–53)
AST: 23 U/L (ref 0–37)
Albumin: 4.1 g/dL (ref 3.5–5.2)
Alkaline Phosphatase: 100 U/L (ref 39–117)
BUN: 19 mg/dL (ref 6–23)
CO2: 24 mEq/L (ref 19–32)
Calcium: 9.2 mg/dL (ref 8.4–10.5)
Chloride: 101 mEq/L (ref 96–112)
Creatinine, Ser: 0.91 mg/dL (ref 0.40–1.50)
GFR: 87.99 mL/min (ref >60.00)
Glucose, Bld: 303 mg/dL — ABNORMAL HIGH (ref 70–99)
Potassium: 4.5 mEq/L (ref 3.5–5.1)
Sodium: 136 mEq/L (ref 135–145)
Total Bilirubin: 0.6 mg/dL (ref 0.2–1.2)
Total Protein: 6.4 g/dL (ref 6.0–8.3)

## 2021-02-10 LAB — LIPID PANEL
Cholesterol: 147 mg/dL (ref 0–200)
HDL: 44 mg/dL (ref >39.00)
NonHDL: 103.09
Total CHOL/HDL Ratio: 3
Triglycerides: 231 mg/dL — ABNORMAL HIGH (ref 0.0–149.0)
VLDL: 46.2 mg/dL — ABNORMAL HIGH (ref 0.0–40.0)

## 2021-02-10 LAB — HEMOGLOBIN A1C: Hgb A1c MFr Bld: 13.2 % — ABNORMAL HIGH (ref 4.6–6.5)

## 2021-02-10 LAB — LDL CHOLESTEROL, DIRECT: Direct LDL: 77 mg/dL

## 2021-02-10 NOTE — Progress Notes (Signed)
Tommi Rumps, MD Phone: [phone]  Michael Jordan is a 65 y.o. male who presents today for f/u.  DIABETES Disease Monitoring: Blood Sugar ranges-not checking his blood glucose. Polyuria/phagia/dipsia-patient drinks a lot of water though notes there has been no change      Optho-up-to-date Medications: Compliance-taking Jardiance and Ozempic hypoglycemic symptoms-no Milk still present issue.  He has about 24 ounces a day.  He is eating more fresh vegetables less meat.  He is eating a lot of tomatoes sandwiches.  He is active throughout the day with his jobs.  He also does yard work.  Incomplete bladder emptying: Patient notes his job needs a form filled out due to him taking a long time in the bathroom to urinate.  He notes if he does not completely empty his bladder out he will have to go right back to the bathroom.  Feels as though it takes quite some time to get it completely empty.  There is no straining.  His flow is mostly good.  No dysuria.  He gets up 3 times per night though he does drink a lot of water.  No postvoid dribbling.  He declines digital rectal exam today.  Hyperlipidemia: Taking Lipitor and Zetia.  No chest pain, claudication, right upper quadrant pain, or myalgias.   Social History   Tobacco Use  Smoking Status Former   Packs/day: 0.50   Years: 44.00   Pack years: 22.00   Types: Cigarettes   Quit date: 06/02/2015   Years since quitting: 5.7  Smokeless Tobacco Never    Current Outpatient Medications on File Prior to Visit  Medication Sig Dispense Refill   aspirin 81 MG chewable tablet Chew 1 tablet (81 mg total) by mouth daily.     atorvastatin (LIPITOR) 80 MG tablet TAKE 1 TABLET BY MOUTH DAILY AT 6 PM. 90 tablet 1   blood glucose meter kit and supplies KIT Dispense based on patient and insurance preference. Use once daily as directed. (FOR ICD-10 E11.9). 1 each 0   carvedilol (COREG) 12.5 MG tablet TAKE 1 TABLET BY MOUTH TWICE A DAY 180 tablet 1    empagliflozin (JARDIANCE) 25 MG TABS tablet Take 1 tablet (25 mg total) by mouth daily. 90 tablet 3   ezetimibe (ZETIA) 10 MG tablet Take 1 tablet (10 mg total) by mouth daily. 90 tablet 3   glucose blood test strip Use once daily as directed E11.9 100 each 1   lisinopril (ZESTRIL) 10 MG tablet Take 1 tablet (10 mg total) by mouth daily. 90 tablet 3   metFORMIN (GLUCOPHAGE XR) 500 MG 24 hr tablet Take 1 tablet (500 mg total) by mouth 2 (two) times daily with a meal. 180 tablet 1   nitroGLYCERIN (NITROSTAT) 0.4 MG SL tablet Place 1 tablet (0.4 mg total) under the tongue every 5 (five) minutes as needed for chest pain. 25 tablet 0   Semaglutide, 1 MG/DOSE, (OZEMPIC, 1 MG/DOSE,) 2 MG/1.5ML SOPN Inject 1 mg into the skin once a week. 9 mL 1   No current facility-administered medications on file prior to visit.     ROS see history of present illness  Objective  Physical Exam Vitals:   02/10/21 1236  BP: 140/80  Pulse: 85  Temp: 98.2 F (36.8 C)  SpO2: 96%    BP Readings from Last 3 Encounters:  02/10/21 140/80  10/25/20 120/70  09/23/20 130/82   Wt Readings from Last 3 Encounters:  02/10/21 286 lb 3.2 oz (129.8 kg)  10/25/20 291  lb (132 kg)  09/23/20 293 lb (132.9 kg)    Physical Exam Constitutional:      General: He is not in acute distress.    Appearance: He is not diaphoretic.  Cardiovascular:     Rate and Rhythm: Normal rate and regular rhythm.     Heart sounds: Normal heart sounds.  Pulmonary:     Effort: Pulmonary effort is normal.     Breath sounds: Normal breath sounds.  Skin:    General: Skin is warm and dry.  Neurological:     Mental Status: He is alert.     Assessment/Plan: Please see individual problem list.  Problem List Items Addressed This Visit     Essential hypertension   Relevant Orders   Comp Met (CMET) (Completed)   Feeling of incomplete bladder emptying    Likely related to BPH.  The patient declined a digital rectal exam today.  He  declines medication for this.  If he changes his mind regarding that he will let us know.  I did provide accommodations for him to have a 10 to 15-minute bathroom break every 2 hours while at work.       Hyperlipidemia    Check lipid panel.  He will continue Lipitor 80 mg once daily and Zetia 10 mg once daily.       Relevant Orders   Lipid panel (Completed)   Uncontrolled type 2 diabetes mellitus with circulatory disorder (HCC) - Primary    Check A1c.  Discussed the potential for increasing his Ozempic to 2 mg once weekly.  He will continue Jardiance 25 mg daily.  I did encourage continued dietary changes and cutting down on carbohydrate intake.       Relevant Orders   HgB A1c (Completed)     Return in about 3 months (around 05/13/2021).  This visit occurred during the SARS-CoV-2 public health emergency.  Safety protocols were in place, including screening questions prior to the visit, additional usage of staff PPE, and extensive cleaning of exam room while observing appropriate contact time as indicated for disinfecting solutions.    Tommi Rumps, MD [place]

## 2021-02-10 NOTE — Patient Instructions (Signed)
Nice to see you. We will get lab work today. If you change your mind regarding medication for your prostate please let me know. Please continue to work on cutting down on her carbohydrate intake.

## 2021-02-11 DIAGNOSIS — R3914 Feeling of incomplete bladder emptying: Secondary | ICD-10-CM | POA: Insufficient documentation

## 2021-02-11 NOTE — Assessment & Plan Note (Signed)
Check A1c.  Discussed the potential for increasing his Ozempic to 2 mg once weekly.  He will continue Jardiance 25 mg daily.  I did encourage continued dietary changes and cutting down on carbohydrate intake.

## 2021-02-11 NOTE — Assessment & Plan Note (Signed)
Likely related to BPH.  The patient declined a digital rectal exam today.  He declines medication for this.  If he changes his mind regarding that he will let us know.  I did provide accommodations for him to have a 10 to 15-minute bathroom break every 2 hours while at work.

## 2021-02-11 NOTE — Assessment & Plan Note (Signed)
Check lipid panel.  He will continue Lipitor 80 mg once daily and Zetia 10 mg once daily. °

## 2021-02-25 ENCOUNTER — Other Ambulatory Visit: Payer: Self-pay | Admitting: Family Medicine

## 2021-02-25 MED ORDER — BASAGLAR KWIKPEN 100 UNIT/ML ~~LOC~~ SOPN: 10.0000 [IU] | PEN_INJECTOR | Freq: Every day | SUBCUTANEOUS | 1 refills | Status: DC

## 2021-02-25 MED ORDER — SEMAGLUTIDE (2 MG/DOSE) 8 MG/3ML ~~LOC~~ SOPN: 2.0000 mg | PEN_INJECTOR | SUBCUTANEOUS | 1 refills | Status: DC

## 2021-04-09 ENCOUNTER — Other Ambulatory Visit: Payer: Self-pay | Admitting: Family Medicine

## 2021-04-09 DIAGNOSIS — E785 Hyperlipidemia, unspecified: Secondary | ICD-10-CM

## 2021-04-15 ENCOUNTER — Other Ambulatory Visit: Payer: Self-pay | Admitting: Family Medicine

## 2021-04-15 DIAGNOSIS — I10 Essential (primary) hypertension: Secondary | ICD-10-CM

## 2021-04-15 DIAGNOSIS — IMO0002 Reserved for concepts with insufficient information to code with codable children: Secondary | ICD-10-CM

## 2021-04-15 DIAGNOSIS — E1165 Type 2 diabetes mellitus with hyperglycemia: Secondary | ICD-10-CM

## 2021-04-15 DIAGNOSIS — E1159 Type 2 diabetes mellitus with other circulatory complications: Secondary | ICD-10-CM

## 2021-05-20 ENCOUNTER — Other Ambulatory Visit: Payer: Self-pay

## 2021-05-20 ENCOUNTER — Encounter: Payer: Self-pay | Admitting: Family Medicine

## 2021-05-20 ENCOUNTER — Ambulatory Visit (INDEPENDENT_AMBULATORY_CARE_PROVIDER_SITE_OTHER): Payer: BC Managed Care – PPO | Admitting: Family Medicine

## 2021-05-20 DIAGNOSIS — E1165 Type 2 diabetes mellitus with hyperglycemia: Secondary | ICD-10-CM | POA: Diagnosis not present

## 2021-05-20 DIAGNOSIS — I10 Essential (primary) hypertension: Secondary | ICD-10-CM

## 2021-05-20 LAB — POCT GLYCOSYLATED HEMOGLOBIN (HGB A1C): Hemoglobin A1C: 9.2 % — AB (ref 4.0–5.6)

## 2021-05-20 MED ORDER — ACCU-CHEK SOFTCLIX LANCETS MISC: 3 refills | Status: DC

## 2021-05-20 MED ORDER — GLUCOSE BLOOD VI STRP: ORAL_STRIP | 1 refills | Status: DC

## 2021-05-20 MED ORDER — BASAGLAR KWIKPEN 100 UNIT/ML ~~LOC~~ SOPN: 12.0000 [IU] | PEN_INJECTOR | Freq: Every day | SUBCUTANEOUS | 1 refills | Status: DC

## 2021-05-20 NOTE — Assessment & Plan Note (Signed)
Adequately controlled.  He will continue carvedilol 12.5 mg twice daily and lisinopril 10 mg once daily.

## 2021-05-20 NOTE — Patient Instructions (Signed)
Nice to see you. I am going to have you increase the Basaglar insulin to 12 units once daily.  You will check your sugar every day.  After increasing to 12 units I would like for you to increase it by 2 units every 4 days until your glucose is less than 140 consistently.  If you start to have low sugars less than 80 with these increases please let us know and decrease the dose back to the prior dosage.

## 2021-05-20 NOTE — Assessment & Plan Note (Signed)
Uncontrolled though control seems to be improving.  We will have him increase the Basaglar to 12 units once daily.  He will continue to check his glucose.  He will increase the Basaglar by 2 units every 4 days until his glucose is less than 140.  If he starts having glucoses less than 80 with this he will decrease the dose and let us know.  He will continue his Jardiance 25 mg once daily, Ozempic 2 mg once weekly, and metformin XR 500 mg twice daily.

## 2021-05-20 NOTE — Progress Notes (Signed)
Michael Rumps, MD Phone: [phone]  Michael Jordan is a 65 y.o. male who presents today for f/u.  DIABETES Disease Monitoring: Blood Sugar ranges-150s-234 Polyuria/phagia/dipsia- no      Optho- UTD Medications: Compliance- taking basaglar 10 u daily, jardiance, metformin, ozempic Hypoglycemic symptoms- no Appetite has decreased some and he is eating less. He has cut out sodas.   HYPERTENSION Disease Monitoring Home BP Monitoring similar to today  Chest pain- no    Dyspnea- no Medications Compliance-  taking coreg, lisinopril.   Edema- no BMET    Component Value Date/Time   NA 136 02/10/2021 1304   K 4.5 02/10/2021 1304   CL 101 02/10/2021 1304   CO2 24 02/10/2021 1304   GLUCOSE 303 (H) 02/10/2021 1304   BUN 19 02/10/2021 1304   CREATININE 0.91 02/10/2021 1304   CALCIUM 9.2 02/10/2021 1304   GFRNONAA >60 11/09/2015 0912   GFRAA >60 11/09/2015 0912      Social History   Tobacco Use  Smoking Status Former   Packs/day: 0.50   Years: 44.00   Pack years: 22.00   Types: Cigarettes   Quit date: 06/02/2015   Years since quitting: 5.9  Smokeless Tobacco Never    Current Outpatient Medications on File Prior to Visit  Medication Sig Dispense Refill   aspirin 81 MG chewable tablet Chew 1 tablet (81 mg total) by mouth daily.     atorvastatin (LIPITOR) 80 MG tablet TAKE 1 TABLET BY MOUTH DAILY AT 6 PM. 90 tablet 1   blood glucose meter kit and supplies KIT Dispense based on patient and insurance preference. Use once daily as directed. (FOR ICD-10 E11.9). 1 each 0   carvedilol (COREG) 12.5 MG tablet TAKE 1 TABLET BY MOUTH TWICE A DAY 180 tablet 1   ezetimibe (ZETIA) 10 MG tablet TAKE 1 TABLET BY MOUTH EVERY DAY 90 tablet 3   JARDIANCE 25 MG TABS tablet TAKE 1 TABLET BY MOUTH EVERY DAY 90 tablet 3   lisinopril (ZESTRIL) 10 MG tablet Take 1 tablet (10 mg total) by mouth daily. 90 tablet 3   metFORMIN (GLUCOPHAGE-XR) 500 MG 24 hr tablet TAKE 1 TABLET BY MOUTH 2 TIMES DAILY  WITH A MEAL. 180 tablet 1   nitroGLYCERIN (NITROSTAT) 0.4 MG SL tablet Place 1 tablet (0.4 mg total) under the tongue every 5 (five) minutes as needed for chest pain. 25 tablet 0   Semaglutide, 2 MG/DOSE, 8 MG/3ML SOPN Inject 2 mg as directed once a week. 9 mL 1   No current facility-administered medications on file prior to visit.     ROS see history of present illness  Objective  Physical Exam Vitals:   05/20/21 1534  BP: 130/70  Pulse: 81  Temp: 97.9 F (36.6 C)  SpO2: 95%    BP Readings from Last 3 Encounters:  05/20/21 130/70  02/10/21 140/80  10/25/20 120/70   Wt Readings from Last 3 Encounters:  05/20/21 284 lb 12.8 oz (129.2 kg)  02/10/21 286 lb 3.2 oz (129.8 kg)  10/25/20 291 lb (132 kg)    Physical Exam Constitutional:      General: He is not in acute distress.    Appearance: He is not diaphoretic.  Cardiovascular:     Rate and Rhythm: Normal rate and regular rhythm.     Heart sounds: Normal heart sounds.  Pulmonary:     Effort: Pulmonary effort is normal.     Breath sounds: Normal breath sounds.  Skin:    General: Skin  is warm and dry.  Neurological:     Mental Status: He is alert.   Diabetic Foot Exam - Simple   Simple Foot Form Diabetic Foot exam was performed with the following findings: Yes 05/20/2021  3:47 PM  Visual Inspection No deformities, no ulcerations, no other skin breakdown bilaterally: Yes Sensation Testing Intact to touch and monofilament testing bilaterally: Yes Pulse Check Posterior Tibialis and Dorsalis pulse intact bilaterally: Yes Comments      Assessment/Plan: Please see individual problem list.  Problem List Items Addressed This Visit     Essential hypertension    Adequately controlled.  He will continue carvedilol 12.5 mg twice daily and lisinopril 10 mg once daily.      Uncontrolled type 2 diabetes mellitus with hyperglycemia (HCC)    Uncontrolled though control seems to be improving.  We will have him increase  the Basaglar to 12 units once daily.  He will continue to check his glucose.  He will increase the Basaglar by 2 units every 4 days until his glucose is less than 140.  If he starts having glucoses less than 80 with this he will decrease the dose and let us know.  He will continue his Jardiance 25 mg once daily, Ozempic 2 mg once weekly, and metformin XR 500 mg twice daily.      Relevant Medications   Insulin Glargine (BASAGLAR KWIKPEN) 100 UNIT/ML   Accu-Chek Softclix Lancets lancets   Other Relevant Orders   POCT HgB A1C   Other Visit Diagnoses     Type 2 diabetes mellitus with hyperglycemia, without long-term current use of insulin (HCC)       Relevant Medications   Insulin Glargine (BASAGLAR KWIKPEN) 100 UNIT/ML   glucose blood test strip      Return in about 3 months (around 08/20/2021) for Diabetes.  This visit occurred during the SARS-CoV-2 public health emergency.  Safety protocols were in place, including screening questions prior to the visit, additional usage of staff PPE, and extensive cleaning of exam room while observing appropriate contact time as indicated for disinfecting solutions.    Michael Rumps, MD [place]

## 2021-09-02 ENCOUNTER — Ambulatory Visit: Payer: BC Managed Care – PPO | Admitting: Family Medicine

## 2021-11-21 ENCOUNTER — Telehealth: Payer: Self-pay | Admitting: Family Medicine

## 2021-11-21 NOTE — Telephone Encounter (Signed)
Pt requesting lab orders... No lab orders in systems... Pt requesting callback...  ?

## 2021-11-24 NOTE — Telephone Encounter (Signed)
I called the patient and LVM for the patient to cal back to see what labs he needs. Coralee North, cma  ?

## 2021-11-28 NOTE — Telephone Encounter (Signed)
I called and spoke with the patient and he stated he wants labs done on the same visit so he can only come back once, he would not schedule lab before appointment.  He is scheduled.  Kollins Fenter,cma  ?

## 2021-12-14 ENCOUNTER — Ambulatory Visit: Payer: Self-pay | Admitting: Family Medicine

## 2021-12-21 ENCOUNTER — Ambulatory Visit: Payer: Medicare HMO | Admitting: Cardiovascular Disease

## 2021-12-21 ENCOUNTER — Encounter: Payer: Self-pay | Admitting: Cardiovascular Disease

## 2021-12-21 VITALS — BP 120/80 | HR 100 | Ht 71.0 in | Wt 285.1 lb

## 2021-12-21 DIAGNOSIS — I10 Essential (primary) hypertension: Secondary | ICD-10-CM | POA: Diagnosis not present

## 2021-12-21 DIAGNOSIS — I2510 Atherosclerotic heart disease of native coronary artery without angina pectoris: Secondary | ICD-10-CM | POA: Diagnosis not present

## 2021-12-21 DIAGNOSIS — E1165 Type 2 diabetes mellitus with hyperglycemia: Secondary | ICD-10-CM

## 2021-12-21 DIAGNOSIS — E785 Hyperlipidemia, unspecified: Secondary | ICD-10-CM | POA: Diagnosis not present

## 2021-12-21 MED ORDER — LISINOPRIL 5 MG PO TABS: 5.0000 mg | ORAL_TABLET | Freq: Every day | ORAL | 1 refills | Status: DC

## 2021-12-21 MED ORDER — ATORVASTATIN CALCIUM 80 MG PO TABS: ORAL_TABLET | ORAL | 1 refills | Status: DC

## 2021-12-21 MED ORDER — CARVEDILOL 3.125 MG PO TABS: 3.1250 mg | ORAL_TABLET | Freq: Two times a day (BID) | ORAL | 1 refills | Status: DC

## 2021-12-21 NOTE — Progress Notes (Signed)
Cardiology Office Note   Date:  12/21/2021   ID:  Rochester, Serpe 02-Jun-1956, MRN [id number]  PCP:  [name], MD  Cardiologist:   Kathlyn Sacramento, MD   Chief Complaint  Patient presents with   Other    12 Month f/u pt hasn't taken any medications since 03/2021 due to loss of insurance and feels better without it. Meds reviewed verbally with pt.      History of Present Illness:  Michael Jordan is a 66 y.o. male who presents for a follow-up visit regarding coronary artery disease.  He was hospitalized in March 2017 with anterior ST elevation myocardial infarction. Cardiac catheterization showed occluded proximal LAD with mild disease affecting the diagonals, chronically occluded right coronary artery with left to right collaterals and moderate left circumflex disease. He underwent successful angioplasty and drug-eluting stent placement in the proximal LAD. Ejection fraction was 40-45% by LV gram and 35-40% by echocardiogram.  He has other chronic medical conditions that include morbid obesity, hypertension, hyperlipidemia and type 2 diabetes. He quit Smoking in November 2016  Most recent echocardiogram in May of 2021 showed an EF of 45 to 50% with mild LVH.    He lost his medical insurance last year through his wife and thus stopped taking all his medications in September.  He has been feeling fine with no chest pain, shortness of breath or palpitations.  He now has Medicare.  Past Medical History:  Diagnosis Date   Abnormal stress test    CAD (coronary artery disease)    a. 09/2015 Ant STEMI/PCI: LAD 100p (3.0x18 Xience Alpine DES), RCA 80p/14m(CTO)->fills via collats, EF 45-50%; b. 10/2015 MV: fixed septal/inf defects w/ dramatic ECG changes; c. 10/2015 Cath: LM nl, LAD patent stent, D1 40, SP1 40, LCX 479mRCA 80p/1003mF 45-50%-->Med Rx.   Cardiomyopathy, ischemic    a. 09/2015 Echo: EF 35-40%, sev apical, periapical, antapical HK, mild to mod ant, inf HK, Gr1 DD.    History of tobacco abuse    a. Quit 05/2015.   Hyperlipidemia    Morbid obesity ([facility] [facility]  Tobacco abuse 10/01/2015   Type II diabetes mellitus ([facility]   Past Surgical History:  Procedure Laterality Date   CARDIAC CATHETERIZATION N/A 09/30/2015   Procedure: Left Heart Cath and Coronary Angiography;  Surgeon: MuhWellington HampshireD;  [facility];  Service: Cardiovascular;  Laterality: N/A;   CARDIAC CATHETERIZATION N/A 09/30/2015   Procedure: Coronary Stent Intervention;  Surgeon: MuhWellington HampshireD;  [facility];  Service: Cardiovascular;  Laterality: N/A;   CARDIAC CATHETERIZATION N/A 11/11/2015   Procedure: Left Heart Cath and Coronary Angiography;  Surgeon: MuhWellington HampshireD;  [facility];  Service: Cardiovascular;  Laterality: N/A;   TONSILLECTOMY       Current Outpatient Medications  Medication Sig Dispense Refill   Accu-Chek Softclix Lancets lancets Use to check glucose 1x/day. Dx code E11.9. (Patient not taking: Reported on 12/21/2021) 204 each 3   aspirin 81 MG chewable tablet Chew 1 tablet (81 mg total) by mouth daily. (Patient not taking: Reported on 12/21/2021)     atorvastatin (LIPITOR) 80 MG tablet TAKE 1 TABLET BY MOUTH DAILY AT 6 PM. (Patient not taking: Reported on 12/21/2021) 90 tablet 1   blood glucose meter kit and supplies KIT Dispense based on patient and insurance preference. Use once daily as directed. (FOR ICD-10 E11.9). (Patient not taking: Reported  on 12/21/2021) 1 each 0   carvedilol (COREG) 12.5 MG tablet TAKE 1 TABLET BY MOUTH TWICE A DAY (Patient not taking: Reported on 12/21/2021) 180 tablet 1   ezetimibe (ZETIA) 10 MG tablet TAKE 1 TABLET BY MOUTH EVERY DAY (Patient not taking: Reported on 12/21/2021) 90 tablet 3   glucose blood test strip Use once daily as directed E11.9 (Patient not taking: Reported on 12/21/2021) 100 each 1   Insulin Glargine (BASAGLAR KWIKPEN) 100 UNIT/ML Inject 12 Units into the skin daily.  Every 4 days increase by 2 units of basaglar until CBG is consistently less than 140. (Patient not taking: Reported on 12/21/2021) 12 mL 1   JARDIANCE 25 MG TABS tablet TAKE 1 TABLET BY MOUTH EVERY DAY (Patient not taking: Reported on 12/21/2021) 90 tablet 3   lisinopril (ZESTRIL) 10 MG tablet Take 1 tablet (10 mg total) by mouth daily. (Patient not taking: Reported on 12/21/2021) 90 tablet 3   metFORMIN (GLUCOPHAGE-XR) 500 MG 24 hr tablet TAKE 1 TABLET BY MOUTH 2 TIMES DAILY WITH A MEAL. (Patient not taking: Reported on 12/21/2021) 180 tablet 1   nitroGLYCERIN (NITROSTAT) 0.4 MG SL tablet Place 1 tablet (0.4 mg total) under the tongue every 5 (five) minutes as needed for chest pain. (Patient not taking: Reported on 12/21/2021) 25 tablet 0   Semaglutide, 2 MG/DOSE, 8 MG/3ML SOPN Inject 2 mg as directed once a week. (Patient not taking: Reported on 12/21/2021) 9 mL 1   No current facility-administered medications for this visit.    Allergies:   Patient has no known allergies.    Social History:  The patient  reports that he quit smoking about 6 years ago. His smoking use included cigarettes. He has a 22.00 pack-year smoking history. He has never used smokeless tobacco. He reports current alcohol use. He reports that he does not use drugs.   Family History:  The patient's family history includes Diabetes in his mother; Heart attack in his father, mother, and paternal grandfather.    ROS:  Please see the history of present illness.   Otherwise, review of systems are positive for none.   All other systems are reviewed and negative.    PHYSICAL EXAM: VS:  BP 120/80 (BP Location: Left Arm, Patient Position: Sitting, Cuff Size: Normal)   Pulse 100   Ht _0  (1.803 m)   Wt 285 lb 2 oz (129.3 kg)   SpO2 98%   BMI 39.77 kg/m  , BMI Body mass index is 39.77 kg/m. GEN: Well nourished, well developed, in no acute distress  HEENT: normal  Neck: no JVD, carotid bruits, or masses Cardiac: RRR; no  murmurs, rubs, or gallops,no edema  Respiratory:  clear to auscultation bilaterally, normal work of breathing GI: soft, nontender, nondistended, + BS MS: no deformity or atrophy  Skin: warm and dry, no rash Neuro:  Strength and sensation are intact Psych: euthymic mood, full affect   EKG:  EKG is ordered today. EKG showed normal sinus rhythm with low voltage and right bundle branch block   Recent Labs: 02/10/2021: ALT 17; BUN 19; Creatinine, Ser 0.91; Potassium 4.5; Sodium 136    Lipid Panel    Component Value Date/Time   CHOL 147 02/10/2021 1304   TRIG 231.0 (H) 02/10/2021 1304   HDL 44.00 02/10/2021 1304   CHOLHDL 3 02/10/2021 1304   VLDL 46.2 (H) 02/10/2021 1304   LDLCALC 86 12/26/2019 0818   LDLDIRECT 77.0 02/10/2021 1304      Wt Readings  from Last 3 Encounters:  12/21/21 285 lb 2 oz (129.3 kg)  05/20/21 284 lb 12.8 oz (129.2 kg)  02/10/21 286 lb 3.2 oz (129.8 kg)        ASSESSMENT AND PLAN:  1.  Coronary artery disease involving native coronary arteries without angina: He is doing very well with no anginal symptoms.  Continue aspirin indefinitely.  2. Hyperlipidemia: I refilled atorvastatin 80 mg daily.  We will have to get subsequent labs and if needed resume ezetimibe as well.  3. Ischemic cardiomyopathy: Most recent echo showed an ejection fraction to 45 to 50%.  The patient stopped taking all his cardiac medications.  I elected to resume carvedilol at a lower dose and lisinopril also at a lower dose given that his blood pressure is on the low side.  The patient is going to see Dr. Caryl Bis in the near future and will get routine labs.   4. Essential hypertension: Blood pressure is controlled without medications and thus I resumed his heart failure medications at lower doses than before.  I discussed with the patient the importance of compliance with medications.   Disposition:   FU with me in 6 months  Signed,  Kathlyn Sacramento, MD  12/21/2021 3:53 PM     [place]

## 2021-12-21 NOTE — Patient Instructions (Signed)
Medication Instructions:   Your physician has recommended you make the following change in your medication:     Resume taking your Atorvastatin 80 MG once a day.  2.     Resume taking Carvedilol (Coreg) at 3.125 MG twice a day.  3.     Resume taking Lisinopril at 5 MG once a day.   *If you need a refill on your cardiac medications before your next appointment, please call your pharmacy*   Lab Work: None ordered If you have labs (blood work) drawn today and your tests are completely normal, you will receive your results only by: MyChart Message (if you have MyChart) OR A paper copy in the mail If you have any lab test that is abnormal or we need to change your treatment, we will call you to review the results.   Testing/Procedures: None ordered   Follow-Up: At [facility], you and your health needs are our priority.  As part of our continuing mission to provide you with exceptional heart care, we have created designated Provider Care Teams.  These Care Teams include your primary Cardiologist (physician) and Advanced Practice Providers (APPs -  Physician Assistants and Nurse Practitioners) who all work together to provide you with the care you need, when you need it.  We recommend signing up for the patient portal called "MyChart".  Sign up information is provided on this After Visit Summary.  MyChart is used to connect with patients for Virtual Visits (Telemedicine).  Patients are able to view lab/test results, encounter notes, upcoming appointments, etc.  Non-urgent messages can be sent to your provider as well.   To learn more about what you can do with MyChart, go to [email or web address].    Your next appointment:   6 month(s)  The format for your next appointment:   In Person  Provider:   You may see Lorine Bears, MD or one of the following Advanced Practice Providers on your designated Care Team:   Nicolasa Ducking, NP Eula Listen, PA-C Cadence Fransico Michael, [place]     Other Instructions   Important Information About Sugar

## 2022-01-03 ENCOUNTER — Telehealth: Payer: Self-pay | Admitting: *Deleted

## 2022-01-03 ENCOUNTER — Ambulatory Visit (INDEPENDENT_AMBULATORY_CARE_PROVIDER_SITE_OTHER): Payer: Medicare HMO | Admitting: Family Medicine

## 2022-01-03 ENCOUNTER — Encounter: Payer: Self-pay | Admitting: Family Medicine

## 2022-01-03 DIAGNOSIS — E785 Hyperlipidemia, unspecified: Secondary | ICD-10-CM

## 2022-01-03 DIAGNOSIS — E1165 Type 2 diabetes mellitus with hyperglycemia: Secondary | ICD-10-CM

## 2022-01-03 DIAGNOSIS — I10 Essential (primary) hypertension: Secondary | ICD-10-CM

## 2022-01-03 LAB — LIPID PANEL
Cholesterol: 267 mg/dL — ABNORMAL HIGH (ref 0–200)
HDL: 55.5 mg/dL (ref >39.00)
NonHDL: 211.96
Total CHOL/HDL Ratio: 5
Triglycerides: 275 mg/dL — ABNORMAL HIGH (ref 0.0–149.0)
VLDL: 55 mg/dL — ABNORMAL HIGH (ref 0.0–40.0)

## 2022-01-03 LAB — COMPREHENSIVE METABOLIC PANEL
ALT: 15 U/L (ref 0–53)
AST: 23 U/L (ref 0–37)
Albumin: 4 g/dL (ref 3.5–5.2)
Alkaline Phosphatase: 95 U/L (ref 39–117)
BUN: 14 mg/dL (ref 6–23)
CO2: 22 mEq/L (ref 19–32)
Calcium: 9.3 mg/dL (ref 8.4–10.5)
Chloride: 96 mEq/L (ref 96–112)
Creatinine, Ser: 1.11 mg/dL (ref 0.40–1.50)
GFR: 68.89 mL/min (ref >60.00)
Glucose, Bld: 636 mg/dL (ref 70–99)
Potassium: 4 mEq/L (ref 3.5–5.1)
Sodium: 131 mEq/L — ABNORMAL LOW (ref 135–145)
Total Bilirubin: 0.6 mg/dL (ref 0.2–1.2)
Total Protein: 6.4 g/dL (ref 6.0–8.3)

## 2022-01-03 LAB — HEMOGLOBIN A1C: Hgb A1c MFr Bld: 15.8 % — ABNORMAL HIGH (ref 4.6–6.5)

## 2022-01-03 LAB — LDL CHOLESTEROL, DIRECT: Direct LDL: 186 mg/dL

## 2022-01-03 NOTE — Progress Notes (Signed)
Tommi Rumps, MD Phone: [phone]  Michael Jordan is a 66 y.o. male who presents today for f/u. Patient notes he has been off all his meds for quite a while as he did not have insurance. He is now on medicare.   DIABETES Disease Monitoring: Blood Sugar ranges-not checking Polyuria/phagia/dipsia- no      Optho- due Medications: Compliance- not taking medication Hypoglycemic symptoms- no  HYPERTENSION Disease Monitoring Home BP Monitoring not checking Chest pain- no    Dyspnea- no Medications Compliance-  taking no medication, notes cardiology just started him back on lisinopril and coreg BMET    Component Value Date/Time   NA 136 02/10/2021 1304   K 4.5 02/10/2021 1304   CL 101 02/10/2021 1304   CO2 24 02/10/2021 1304   GLUCOSE 303 (H) 02/10/2021 1304   BUN 19 02/10/2021 1304   CREATININE 0.91 02/10/2021 1304   CALCIUM 9.2 02/10/2021 1304   GFRNONAA >60 11/09/2015 0912   GFRAA >60 11/09/2015 0912      Social History   Tobacco Use  Smoking Status Former   Packs/day: 0.50   Years: 44.00   Total pack years: 22.00   Types: Cigarettes   Quit date: 06/02/2015   Years since quitting: 6.5  Smokeless Tobacco Never    Current Outpatient Medications on File Prior to Visit  Medication Sig Dispense Refill   Accu-Chek Softclix Lancets lancets Use to check glucose 1x/day. Dx code E11.9. 204 each 3   aspirin 81 MG chewable tablet Chew 1 tablet (81 mg total) by mouth daily.     atorvastatin (LIPITOR) 80 MG tablet TAKE 1 TABLET BY MOUTH DAILY AT 6 PM. 90 tablet 1   blood glucose meter kit and supplies KIT Dispense based on patient and insurance preference. Use once daily as directed. (FOR ICD-10 E11.9). 1 each 0   carvedilol (COREG) 3.125 MG tablet Take 1 tablet (3.125 mg total) by mouth 2 (two) times daily. 180 tablet 1   glucose blood test strip Use once daily as directed E11.9 100 each 1   lisinopril (ZESTRIL) 5 MG tablet Take 1 tablet (5 mg total) by mouth daily. 90  tablet 1   nitroGLYCERIN (NITROSTAT) 0.4 MG SL tablet Place 1 tablet (0.4 mg total) under the tongue every 5 (five) minutes as needed for chest pain. 25 tablet 0   No current facility-administered medications on file prior to visit.     ROS see history of present illness  Objective  Physical Exam Vitals:   01/03/22 1257  BP: 140/70  Pulse: 99  Temp: 98.5 F (36.9 C)  SpO2: 96%    BP Readings from Last 3 Encounters:  01/03/22 140/70  12/21/21 120/80  05/20/21 130/70   Wt Readings from Last 3 Encounters:  01/03/22 282 lb 12.8 oz (128.3 kg)  12/21/21 285 lb 2 oz (129.3 kg)  05/20/21 284 lb 12.8 oz (129.2 kg)    Physical Exam Constitutional:      General: He is not in acute distress.    Appearance: He is not diaphoretic.  Cardiovascular:     Rate and Rhythm: Normal rate and regular rhythm.     Heart sounds: Normal heart sounds.  Pulmonary:     Effort: Pulmonary effort is normal.     Breath sounds: Normal breath sounds.  Skin:    General: Skin is warm and dry.  Neurological:     Mental Status: He is alert.      Assessment/Plan: Please see individual problem list.  Problem List Items Addressed This Visit     Essential hypertension (Chronic)    Blood pressure is above goal though he has not been on his medication.  He will resume carvedilol 3.125 mg twice daily and lisinopril 5 mg daily.  He will need lab work 1 week after resuming the lisinopril.      Relevant Orders   Comp Met (CMET)   Basic Metabolic Panel (BMET)   Hyperlipidemia (Chronic)    The patient will resume his atorvastatin 80 mg daily as advised by cardiology.      Relevant Orders   Comp Met (CMET)   Lipid panel   Uncontrolled type 2 diabetes mellitus with hyperglycemia (HCC) (Chronic)    Undetermined control.  He has been off of all of this medication.  We will check an A1c today.  Discussed the likelihood of going back on medication for his diabetes.      Relevant Orders   HgB A1c    Comp Met (CMET)     Return in about 1 week (around 01/10/2022) for Labs, 3 months PCP.   Tommi Rumps, MD [place]

## 2022-01-03 NOTE — Assessment & Plan Note (Signed)
The patient will resume his atorvastatin 80 mg daily as advised by cardiology.

## 2022-01-03 NOTE — Assessment & Plan Note (Signed)
Undetermined control.  He has been off of all of this medication.  We will check an A1c today.  Discussed the likelihood of going back on medication for his diabetes.

## 2022-01-03 NOTE — Assessment & Plan Note (Signed)
Blood pressure is above goal though he has not been on his medication.  He will resume carvedilol 3.125 mg twice daily and lisinopril 5 mg daily.  He will need lab work 1 week after resuming the lisinopril.

## 2022-01-03 NOTE — Telephone Encounter (Signed)
Advised patient to start 12 units of basaglar and that PCP would discuss long term plans tomorrow concerning the regulation of patients blood sugar .Advised patient any new symptoms he would need to go to the ED patient wife stated she is a Engineer, civil (consulting) and will monitor patient during the night.

## 2022-01-03 NOTE — Telephone Encounter (Signed)
Notify pt - blood sugar 636.  (A1c over 15).  Given level of blood sugar, I do recommend for him to be evaluated.  Recommend ER for IVFs, initiation of insulin, etc.

## 2022-01-03 NOTE — Patient Instructions (Signed)
Nice to see you. Please make sure you see an eye doctor once yearly with your diabetes. We will get lab work today and contact you with the results.

## 2022-01-03 NOTE — Telephone Encounter (Signed)
Patient refuses ED he says he has been off the insulin for awhile but has Basaglar at home he can take if he knows what dosage. But adamantly refuses the ED.

## 2022-01-03 NOTE — Telephone Encounter (Signed)
CRITICAL VALUE STICKER  CRITICAL VALUE: Glucose-636 (H)  RECEIVER (on-site recipient of call): Jari Favre, CMA  DATE & TIME NOTIFIED: 01/03/22 @ 4:20pm  MESSENGER (representative from lab): Flonnie Overman  MD NOTIFIED: Tommi Rumps  TIME OF NOTIFICATION: 4:24pm  RESPONSE:  see results

## 2022-01-03 NOTE — Telephone Encounter (Signed)
Given refuses ER evaluation and given has basaglar, then recommend restarting - basaglar 12 units q day.  Will forward to Dr Birdie Sons for longer term plan. Any change or worsening symptoms, he needs to be evaluated.

## 2022-01-04 MED ORDER — GLUCOSE BLOOD VI STRP: ORAL_STRIP | 1 refills | Status: DC

## 2022-01-04 MED ORDER — ACCU-CHEK SOFTCLIX LANCETS MISC: 3 refills | Status: AC

## 2022-01-04 MED ORDER — SEMAGLUTIDE(0.25 OR 0.5MG/DOS) 2 MG/3ML ~~LOC~~ SOPN: PEN_INJECTOR | SUBCUTANEOUS | 2 refills | Status: DC

## 2022-01-04 NOTE — Telephone Encounter (Signed)
See notes

## 2022-01-04 NOTE — Telephone Encounter (Signed)
Patient has meter will not let nurse order new, Patient has Basaglar insulin 6 pens left he took CBG last night was 540 and stated that our reading was off I explained that venous reading is different wife stated it should not be that far off. Patient did agree to [employer] and eventually Jardiance he stated anything but metformin. Patient stated this morning fasting CBG was 350 so he only took 5 units of basaglar, Patient had large bowl of Chili and large roll two slices of peanut butter toast at lunch CBG is now 450 so he is taking the other 7 units of basaglar, advised patient to continue basaglar 12 units daily as advised until further notice not divided doses. Nurse ordering test strips and lancets.Onetouch ultra 2.

## 2022-01-04 NOTE — Telephone Encounter (Signed)
Please send in a glucometer for the patient. Would he be willing to restart ozempic and eventually restart jardiance as well? If he is ok with this then I can send the ozempic in after you speak with him.

## 2022-01-04 NOTE — Telephone Encounter (Signed)
Thanks.  Ozempic sent to the pharmacy.  He needs follow-up with me in 2-4 weeks if possible for his diabetes.

## 2022-01-05 NOTE — Telephone Encounter (Signed)
Patient aware follow up scheduled.

## 2022-02-08 ENCOUNTER — Encounter: Payer: Self-pay | Admitting: Family Medicine

## 2022-02-08 ENCOUNTER — Other Ambulatory Visit: Payer: Self-pay | Admitting: Family Medicine

## 2022-02-08 ENCOUNTER — Ambulatory Visit (INDEPENDENT_AMBULATORY_CARE_PROVIDER_SITE_OTHER): Payer: Medicare HMO | Admitting: Family Medicine

## 2022-02-08 DIAGNOSIS — E1165 Type 2 diabetes mellitus with hyperglycemia: Secondary | ICD-10-CM | POA: Diagnosis not present

## 2022-02-08 MED ORDER — BASAGLAR KWIKPEN 100 UNIT/ML ~~LOC~~ SOPN: 14.0000 [IU] | PEN_INJECTOR | Freq: Every day | SUBCUTANEOUS | 1 refills | Status: DC

## 2022-02-08 NOTE — Assessment & Plan Note (Signed)
I encouraged adding in some walking for exercise.  Discussed continuing with reduction in carbohydrates and sugars.

## 2022-02-08 NOTE — Progress Notes (Signed)
Michael Rumps, MD Phone: [phone]  Michael Jordan is a 66 y.o. male who presents today for follow-up.  Obesity: Patient notes he cheats a lot with his diet.  He has cut down on bread intake and cut sodas out.  He is drinking less milk.  He is eating lots of fruits and vegetables.  He stays active through work though does not do any specific exercise.  Diabetes: Patient notes his sugars have been in the 200s up to 400s.  He just increased the Ozempic dose to 0.5 mg weekly.  He is taking Basaglar 12 units daily.  He does note polyuria.  No hypoglycemia.  He reports his ophthalmology appointment is up-to-date.  Patient notes he feels sluggish and washed out at times when he is on medication for his diabetes.  Social History   Tobacco Use  Smoking Status Former   Packs/day: 0.50   Years: 44.00   Total pack years: 22.00   Types: Cigarettes   Quit date: 06/02/2015   Years since quitting: 6.6  Smokeless Tobacco Never    Current Outpatient Medications on File Prior to Visit  Medication Sig Dispense Refill   Accu-Chek Softclix Lancets lancets Use to check glucose 1x/day. Dx code E11.9. 204 each 3   aspirin 81 MG chewable tablet Chew 1 tablet (81 mg total) by mouth daily.     atorvastatin (LIPITOR) 80 MG tablet TAKE 1 TABLET BY MOUTH DAILY AT 6 PM. 90 tablet 1   blood glucose meter kit and supplies KIT Dispense based on patient and insurance preference. Use once daily as directed. (FOR ICD-10 E11.9). 1 each 0   carvedilol (COREG) 3.125 MG tablet Take 1 tablet (3.125 mg total) by mouth 2 (two) times daily. 180 tablet 1   glucose blood test strip Use once daily as directed E11.9 100 each 1   lisinopril (ZESTRIL) 5 MG tablet Take 1 tablet (5 mg total) by mouth daily. 90 tablet 1   nitroGLYCERIN (NITROSTAT) 0.4 MG SL tablet Place 1 tablet (0.4 mg total) under the tongue every 5 (five) minutes as needed for chest pain. 25 tablet 0   Semaglutide,0.25 or 0.5MG/DOS, 2 MG/3ML SOPN Inject 0.25 mg  into the skin once a week for 28 days, THEN 0.5 mg once a week. 3 mL 2   No current facility-administered medications on file prior to visit.     ROS see history of present illness  Objective  Physical Exam Vitals:   02/08/22 1338  BP: 110/70  Pulse: 93  Temp: 98.1 F (36.7 C)  SpO2: 97%    BP Readings from Last 3 Encounters:  02/08/22 110/70  01/03/22 140/70  12/21/21 120/80   Wt Readings from Last 3 Encounters:  02/08/22 280 lb 3.2 oz (127.1 kg)  01/03/22 282 lb 12.8 oz (128.3 kg)  12/21/21 285 lb 2 oz (129.3 kg)    Physical Exam Constitutional:      General: He is not in acute distress.    Appearance: He is not diaphoretic.  Cardiovascular:     Rate and Rhythm: Normal rate and regular rhythm.     Heart sounds: Normal heart sounds.  Pulmonary:     Effort: Pulmonary effort is normal.     Breath sounds: Normal breath sounds.  Skin:    General: Skin is warm and dry.  Neurological:     Mental Status: He is alert.      Assessment/Plan: Please see individual problem list.  Problem List Items Addressed This Visit  Morbid obesity (HCC) (Chronic)    I encouraged adding in some walking for exercise.  Discussed continuing with reduction in carbohydrates and sugars.      Relevant Medications   Insulin Glargine (BASAGLAR KWIKPEN) 100 UNIT/ML   Uncontrolled type 2 diabetes mellitus with hyperglycemia (HCC) (Chronic)    Uncontrolled though somewhat improved.  We will have him increase his Basaglar to 14 units daily.  Every 3 days he can increase by another 2 units until his sugars are less than 130 or until he reaches 24 units of Basaglar.  Once he reaches 24 units they will let us know.  We will have him continue Ozempic 0.5 mg weekly.  I will have him follow-up with me in 4 weeks and we can consider increasing the Ozempic further at that time.      Relevant Medications   Insulin Glargine (BASAGLAR KWIKPEN) 100 UNIT/ML    Return in about 4 weeks (around  03/08/2022) for Diabetes.   Michael Rumps, MD [place]

## 2022-02-08 NOTE — Patient Instructions (Signed)
Nice to see you. I am going to have you increase your Basaglar to 14 units daily.  Every 3 days you can go up by another 2 units until your fasting glucose is less than 130.  If you get the 24 units of Basaglar daily and you have not gotten to less than 130 please let me know.

## 2022-02-08 NOTE — Assessment & Plan Note (Signed)
Uncontrolled though somewhat improved.  We will have him increase his Basaglar to 14 units daily.  Every 3 days he can increase by another 2 units until his sugars are less than 130 or until he reaches 24 units of Basaglar.  Once he reaches 24 units they will let us know.  We will have him continue Ozempic 0.5 mg weekly.  I will have him follow-up with me in 4 weeks and we can consider increasing the Ozempic further at that time.

## 2022-02-09 ENCOUNTER — Other Ambulatory Visit: Payer: Self-pay | Admitting: Family

## 2022-02-09 MED ORDER — TRESIBA FLEXTOUCH 100 UNIT/ML ~~LOC~~ SOPN
24.0000 [IU] | PEN_INJECTOR | Freq: Every day | SUBCUTANEOUS | 3 refills | Status: DC
Start: 2022-02-09 — End: 2022-04-10

## 2022-02-23 ENCOUNTER — Ambulatory Visit: Payer: Self-pay | Admitting: Cardiovascular Disease

## 2022-03-08 ENCOUNTER — Encounter: Payer: Self-pay | Admitting: Family Medicine

## 2022-03-08 ENCOUNTER — Ambulatory Visit (INDEPENDENT_AMBULATORY_CARE_PROVIDER_SITE_OTHER): Payer: Medicare HMO | Admitting: Family Medicine

## 2022-03-08 VITALS — BP 130/70 | HR 105 | Temp 98.2°F | Ht 71.0 in | Wt 279.8 lb

## 2022-03-08 DIAGNOSIS — E1165 Type 2 diabetes mellitus with hyperglycemia: Secondary | ICD-10-CM

## 2022-03-08 DIAGNOSIS — I10 Essential (primary) hypertension: Secondary | ICD-10-CM | POA: Diagnosis not present

## 2022-03-08 MED ORDER — SEMAGLUTIDE (1 MG/DOSE) 4 MG/3ML ~~LOC~~ SOPN: 1.0000 mg | PEN_INJECTOR | SUBCUTANEOUS | 2 refills | Status: DC

## 2022-03-08 NOTE — Assessment & Plan Note (Signed)
Adequate control.  He will continue lisinopril 5 mg daily and carvedilol 3.125 mg twice daily.

## 2022-03-08 NOTE — Progress Notes (Signed)
Michael Rumps, MD Phone: [phone]  Michael Jordan is a 66 y.o. male who presents today for f/u.  DIABETES Disease Monitoring: Blood Sugar ranges-255 this morning Polyuria/phagia/dipsia- no      Optho- UTD Medications: Compliance- taking ozempic 0.5 mg weekly, basaglar 22 u daily Hypoglycemic symptoms- no Has had some mild indigestion since going back on the ozempic.   HYPERTENSION Disease Monitoring Home BP Monitoring similar Chest pain- no    Dyspnea- no Medications Compliance-  taking lisinopril, coreg.  BMET    Component Value Date/Time   NA 131 (L) 01/03/2022 1326   K 4.0 01/03/2022 1326   CL 96 01/03/2022 1326   CO2 22 01/03/2022 1326   GLUCOSE 636 (HH) 01/03/2022 1326   BUN 14 01/03/2022 1326   CREATININE 1.11 01/03/2022 1326   CALCIUM 9.3 01/03/2022 1326   GFRNONAA >60 11/09/2015 0912   GFRAA >60 11/09/2015 0912      Social History   Tobacco Use  Smoking Status Former   Packs/day: 0.50   Years: 44.00   Total pack years: 22.00   Types: Cigarettes   Quit date: 06/02/2015   Years since quitting: 6.7  Smokeless Tobacco Never    Current Outpatient Medications on File Prior to Visit  Medication Sig Dispense Refill   Accu-Chek Softclix Lancets lancets Use to check glucose 1x/day. Dx code E11.9. 204 each 3   aspirin 81 MG chewable tablet Chew 1 tablet (81 mg total) by mouth daily.     atorvastatin (LIPITOR) 80 MG tablet TAKE 1 TABLET BY MOUTH DAILY AT 6 PM. 90 tablet 1   blood glucose meter kit and supplies KIT Dispense based on patient and insurance preference. Use once daily as directed. (FOR ICD-10 E11.9). 1 each 0   carvedilol (COREG) 3.125 MG tablet Take 1 tablet (3.125 mg total) by mouth 2 (two) times daily. 180 tablet 1   glucose blood test strip Use once daily as directed E11.9 100 each 1   insulin degludec (TRESIBA FLEXTOUCH) 100 UNIT/ML FlexTouch Pen Inject 24 Units into the skin daily. Increase by 2 units every 3 days up to a max of 24 units 100  mL 3   Insulin Glargine (BASAGLAR KWIKPEN) 100 UNIT/ML  450 mL 3   lisinopril (ZESTRIL) 5 MG tablet Take 1 tablet (5 mg total) by mouth daily. 90 tablet 1   nitroGLYCERIN (NITROSTAT) 0.4 MG SL tablet Place 1 tablet (0.4 mg total) under the tongue every 5 (five) minutes as needed for chest pain. 25 tablet 0   No current facility-administered medications on file prior to visit.     ROS see history of present illness  Objective  Physical Exam Vitals:   03/08/22 1449  BP: 130/70  Pulse: (!) 105  Temp: 98.2 F (36.8 C)  SpO2: 95%    BP Readings from Last 3 Encounters:  03/08/22 130/70  02/08/22 110/70  01/03/22 140/70   Wt Readings from Last 3 Encounters:  03/08/22 279 lb 12.8 oz (126.9 kg)  02/08/22 280 lb 3.2 oz (127.1 kg)  01/03/22 282 lb 12.8 oz (128.3 kg)    Physical Exam Constitutional:      General: He is not in acute distress.    Appearance: He is not diaphoretic.  Cardiovascular:     Rate and Rhythm: Normal rate and regular rhythm.     Heart sounds: Normal heart sounds.  Pulmonary:     Effort: Pulmonary effort is normal.     Breath sounds: Normal breath sounds.  Skin:  General: Skin is warm and dry.  Neurological:     Mental Status: He is alert.      Assessment/Plan: Please see individual problem list.  Problem List Items Addressed This Visit     Essential hypertension - Primary (Chronic)    Adequate control.  He will continue lisinopril 5 mg daily and carvedilol 3.125 mg twice daily.      Uncontrolled type 2 diabetes mellitus with hyperglycemia (HCC) (Chronic)    Uncontrolled though continues to improve.  He will continue Basaglar 22 units daily.  We are going to increase his Ozempic to 1 mg weekly.  He will monitor his indigestion and if it does not resolve or if it worsens he will let us know and we will have to consider an alternative medication.      Relevant Medications   Semaglutide, 1 MG/DOSE, 4 MG/3ML SOPN    Return in about 1 month  (around 04/08/2022).  Form filled out for accommodations to have a bathroom break for 10-15 minutes every 2 hours while are work for his chronic urine urge/frequency.   Michael Rumps, MD [place]

## 2022-03-08 NOTE — Assessment & Plan Note (Signed)
Uncontrolled though continues to improve.  He will continue Basaglar 22 units daily.  We are going to increase his Ozempic to 1 mg weekly.  He will monitor his indigestion and if it does not resolve or if it worsens he will let us know and we will have to consider an alternative medication.

## 2022-03-08 NOTE — Patient Instructions (Signed)
Nice to see you. We will increase your Ozempic to 1 mg weekly.  Please continue Basaglar 22 units daily.   If your indigestion worsens or if it does not improve please let us know so we can consider an alternative to the Ozempic.

## 2022-03-28 ENCOUNTER — Telehealth: Payer: Self-pay | Admitting: Family Medicine

## 2022-03-28 NOTE — Telephone Encounter (Signed)
Copied from CRM [id number]. Topic: Medicare AWV >> Mar 28, 2022 11:07 AM Payton Doughty wrote: Reason for CRM: Left message for patient to schedule Annual Wellness Visit.  Please schedule with Nurse Health Advisor Denisa O'Brien-Blaney, LPN at [facility]. This appt can be telephone or office visit.  Please call [phone] ask for [facility]

## 2022-04-10 ENCOUNTER — Encounter: Payer: Self-pay | Admitting: Family Medicine

## 2022-04-10 ENCOUNTER — Ambulatory Visit (INDEPENDENT_AMBULATORY_CARE_PROVIDER_SITE_OTHER): Payer: Medicare HMO | Admitting: Family Medicine

## 2022-04-10 VITALS — BP 140/80 | HR 79 | Temp 98.2°F | Ht 71.0 in | Wt 284.8 lb

## 2022-04-10 DIAGNOSIS — E1165 Type 2 diabetes mellitus with hyperglycemia: Secondary | ICD-10-CM

## 2022-04-10 DIAGNOSIS — I10 Essential (primary) hypertension: Secondary | ICD-10-CM

## 2022-04-10 LAB — POCT GLYCOSYLATED HEMOGLOBIN (HGB A1C): Hemoglobin A1C: 10.8 % — AB (ref 4.0–5.6)

## 2022-04-10 MED ORDER — SEMAGLUTIDE (2 MG/DOSE) 8 MG/3ML ~~LOC~~ SOPN: 2.0000 mg | PEN_INJECTOR | SUBCUTANEOUS | 3 refills | Status: DC

## 2022-04-10 MED ORDER — BASAGLAR KWIKPEN 100 UNIT/ML ~~LOC~~ SOPN: 25.0000 [IU] | PEN_INJECTOR | Freq: Every day | SUBCUTANEOUS | 3 refills | Status: DC

## 2022-04-10 NOTE — Assessment & Plan Note (Signed)
Uncontrolled today. Patient will start checking daily at home after resting for 10-15 minutes. He will send me his readings in 2 weeks. Discussed a goal of less than 130/80. He will continue coreg 3.125 mg BID and lisinopril 5 mg daily.

## 2022-04-10 NOTE — Progress Notes (Signed)
Tommi Rumps, MD Phone: [phone]  Michael Jordan is a 66 y.o. male who presents today for f/u.  DIABETES Disease Monitoring: Blood Sugar ranges-175-215 Polyuria/phagia/dipsia- no      Optho- due towards the end of the year Medications: Compliance- taking basaglar, ozempic Hypoglycemic symptoms- no  HYPERTENSION Disease Monitoring Home BP Monitoring not checking Chest pain- no    Dyspnea- no Medications Compliance-  taking coreg, lisinopril. Notes he missed taking his medication for a couple of days.  Lightheadedness- no BMET    Component Value Date/Time   NA 131 (L) 01/03/2022 1326   K 4.0 01/03/2022 1326   CL 96 01/03/2022 1326   CO2 22 01/03/2022 1326   GLUCOSE 636 (HH) 01/03/2022 1326   BUN 14 01/03/2022 1326   CREATININE 1.11 01/03/2022 1326   CALCIUM 9.3 01/03/2022 1326   GFRNONAA >60 11/09/2015 0912   GFRAA >60 11/09/2015 0912      Social History   Tobacco Use  Smoking Status Former   Packs/day: 0.50   Years: 44.00   Total pack years: 22.00   Types: Cigarettes   Quit date: 06/02/2015   Years since quitting: 6.8  Smokeless Tobacco Never    Current Outpatient Medications on File Prior to Visit  Medication Sig Dispense Refill   Accu-Chek Softclix Lancets lancets Use to check glucose 1x/day. Dx code E11.9. 204 each 3   aspirin 81 MG chewable tablet Chew 1 tablet (81 mg total) by mouth daily.     atorvastatin (LIPITOR) 80 MG tablet TAKE 1 TABLET BY MOUTH DAILY AT 6 PM. 90 tablet 1   blood glucose meter kit and supplies KIT Dispense based on patient and insurance preference. Use once daily as directed. (FOR ICD-10 E11.9). 1 each 0   carvedilol (COREG) 3.125 MG tablet Take 1 tablet (3.125 mg total) by mouth 2 (two) times daily. 180 tablet 1   ezetimibe (ZETIA) 10 MG tablet Take 10 mg by mouth daily.     glucose blood test strip Use once daily as directed E11.9 100 each 1   JARDIANCE 25 MG TABS tablet Take 25 mg by mouth daily.     lisinopril (ZESTRIL) 5  MG tablet Take 1 tablet (5 mg total) by mouth daily. 90 tablet 1   nitroGLYCERIN (NITROSTAT) 0.4 MG SL tablet Place 1 tablet (0.4 mg total) under the tongue every 5 (five) minutes as needed for chest pain. 25 tablet 0   No current facility-administered medications on file prior to visit.     ROS see history of present illness  Objective  Physical Exam Vitals:   04/10/22 1428  BP: (!) 140/80  Pulse: 79  Temp: 98.2 F (36.8 C)  SpO2: 95%    BP Readings from Last 3 Encounters:  04/10/22 (!) 140/80  03/08/22 130/70  02/08/22 110/70   Wt Readings from Last 3 Encounters:  04/10/22 284 lb 12.8 oz (129.2 kg)  03/08/22 279 lb 12.8 oz (126.9 kg)  02/08/22 280 lb 3.2 oz (127.1 kg)    Physical Exam Constitutional:      General: He is not in acute distress.    Appearance: He is not diaphoretic.  Cardiovascular:     Rate and Rhythm: Normal rate and regular rhythm.     Heart sounds: Normal heart sounds.  Pulmonary:     Effort: Pulmonary effort is normal.     Breath sounds: Normal breath sounds.  Skin:    General: Skin is warm and dry.  Neurological:  Mental Status: He is alert.      Assessment/Plan: Please see individual problem list.  Problem List Items Addressed This Visit     Essential hypertension (Chronic)    Uncontrolled today. Patient will start checking daily at home after resting for 10-15 minutes. He will send me his readings in 2 weeks. Discussed a goal of less than 130/80. He will continue coreg 3.125 mg BID and lisinopril 5 mg daily.       Relevant Medications   ezetimibe (ZETIA) 10 MG tablet   Uncontrolled type 2 diabetes mellitus with hyperglycemia (HCC) - Primary (Chronic)    Improved control. Though still uncontrolled. I will increase his ozempic to 2 mg weekly. He will increase his basaglar to 25 u daily. He will follow-up in 4 weeks for his diabetes.       Relevant Medications   JARDIANCE 25 MG TABS tablet   Insulin Glargine (BASAGLAR KWIKPEN)  100 UNIT/ML   Semaglutide, 2 MG/DOSE, 8 MG/3ML SOPN   Other Relevant Orders   POCT HgB A1C (Completed)   Urine Microalbumin w/creat. ratio     Return in about 1 month (around 05/10/2022) for Follow-up diabetes and hypertension.   Tommi Rumps, MD [place]

## 2022-04-10 NOTE — Assessment & Plan Note (Signed)
Improved control. Though still uncontrolled. I will increase his ozempic to 2 mg weekly. He will increase his basaglar to 25 u daily. He will follow-up in 4 weeks for his diabetes.

## 2022-04-10 NOTE — Patient Instructions (Signed)
Nice to see you. We will increase your Basaglar to 25 units daily.  We will also increase your Ozempic to 2 mg weekly.  If you start having sugars less than 80 with this increase please let us know. Please start checking your blood pressure on a daily basis.  Please write your readings down and send them to me in 2 weeks.

## 2022-04-11 LAB — MICROALBUMIN / CREATININE URINE RATIO
Creatinine,U: 59.7 mg/dL
Microalb Creat Ratio: 1.2 mg/g (ref 0.0–30.0)
Microalb, Ur: <0.7 mg/dL (ref 0.0–1.9)

## 2022-05-15 ENCOUNTER — Telehealth: Payer: Self-pay | Admitting: Family Medicine

## 2022-05-15 NOTE — Telephone Encounter (Signed)
Patient spouse state that pcp said it now not necessary for him to do AWV at his last visit due he is seeing patient often.

## 2022-05-19 ENCOUNTER — Other Ambulatory Visit: Payer: Self-pay | Admitting: Family Medicine

## 2022-05-19 ENCOUNTER — Telehealth: Payer: Self-pay | Admitting: Family Medicine

## 2022-05-19 ENCOUNTER — Other Ambulatory Visit: Payer: Self-pay

## 2022-05-19 DIAGNOSIS — E1165 Type 2 diabetes mellitus with hyperglycemia: Secondary | ICD-10-CM

## 2022-05-19 MED ORDER — BASAGLAR KWIKPEN 100 UNIT/ML ~~LOC~~ SOPN: 25.0000 [IU] | PEN_INJECTOR | Freq: Every day | SUBCUTANEOUS | 0 refills | Status: DC

## 2022-05-19 MED ORDER — TIRZEPATIDE 2.5 MG/0.5ML ~~LOC~~ SOAJ: 2.5000 mg | SUBCUTANEOUS | 0 refills | Status: DC

## 2022-05-19 MED ORDER — TIRZEPATIDE 5 MG/0.5ML ~~LOC~~ SOAJ: 5.0000 mg | SUBCUTANEOUS | 1 refills | Status: DC

## 2022-05-19 NOTE — Telephone Encounter (Signed)
I called and spoke with the patient and he is okay with trying the mounjaro.  Crosby Bevan,cma

## 2022-05-19 NOTE — Telephone Encounter (Signed)
Pt wife would like to be called in regards to pt insulin

## 2022-05-19 NOTE — Telephone Encounter (Signed)
Patient called in for the [place] and I sent it electronically because it was sent in September but there was a error.  Patient stated that the Ozempic is on backorder until February for all pharmacies according to the pharmacies drug rep and they want to know what to do next.  Michael Jordan,cma

## 2022-05-19 NOTE — Telephone Encounter (Signed)
NotedDarcel Bayley sent to pharmacy.  He will do 2.5 mg once weekly for 4 weeks and then after that he will increase to the 5 mg dose of Mounjaro.  I sent both doses in.  Please make sure he understands these instructions.

## 2022-05-19 NOTE — Telephone Encounter (Signed)
We could try switching him over to [facility]. It is a weekly injection though works on 2 receptors instead of one like the ozempic. It may work better for his diabetes. We would need to see if his insurance would approve it. It has a similar side effect and risk profile as the ozempic.

## 2022-05-19 NOTE — Addendum Note (Signed)
Addended by: Caryl Bis, Roza Creamer G on: 05/19/2022 01:30 PM   Modules accepted: Orders

## 2022-05-22 NOTE — Telephone Encounter (Signed)
Can we do a PA for the mounjaro? He is on ozempic. It is not available at this time due to national back order. Patients a1c was not at goal on ozempic.

## 2022-05-23 ENCOUNTER — Ambulatory Visit (INDEPENDENT_AMBULATORY_CARE_PROVIDER_SITE_OTHER): Payer: Medicare HMO | Admitting: Family Medicine

## 2022-05-23 ENCOUNTER — Encounter: Payer: Self-pay | Admitting: Family Medicine

## 2022-05-23 VITALS — BP 130/70 | HR 93 | Temp 99.4°F | Ht 71.0 in | Wt 285.8 lb

## 2022-05-23 DIAGNOSIS — Z87891 Personal history of nicotine dependence: Secondary | ICD-10-CM

## 2022-05-23 DIAGNOSIS — I10 Essential (primary) hypertension: Secondary | ICD-10-CM | POA: Diagnosis not present

## 2022-05-23 DIAGNOSIS — E1165 Type 2 diabetes mellitus with hyperglycemia: Secondary | ICD-10-CM | POA: Diagnosis not present

## 2022-05-23 NOTE — Assessment & Plan Note (Signed)
Refer for lung cancer screening 

## 2022-05-23 NOTE — Progress Notes (Signed)
Tommi Rumps, MD Phone: [phone]  Michael Jordan is a 66 y.o. male who presents today for f/u.  HYPERTENSION Disease Monitoring Home BP Monitoring not checking Chest pain- no    Dyspnea- no Medications Compliance-  taking lisinopril, coreg.  Edema- no BMET    Component Value Date/Time   NA 131 (L) 01/03/2022 1326   K 4.0 01/03/2022 1326   CL 96 01/03/2022 1326   CO2 22 01/03/2022 1326   GLUCOSE 636 (HH) 01/03/2022 1326   BUN 14 01/03/2022 1326   CREATININE 1.11 01/03/2022 1326   CALCIUM 9.3 01/03/2022 1326   GFRNONAA >60 11/09/2015 0912   GFRAA >60 11/09/2015 0912   DIABETES Disease Monitoring: Blood Sugar ranges-200-310 Polyuria/phagia/dipsia- no      Optho- due soon Medications: Compliance- taking basaglar 25 u daily, ran out of ozempic last week and they pharmacies can not get anymore of it, he did have GI issues with indigestion and stomach upset while on the 2 mg dose Hypoglycemic symptoms- no   Social History   Tobacco Use  Smoking Status Former   Packs/day: 0.50   Years: 44.00   Total pack years: 22.00   Types: Cigarettes   Quit date: 06/02/2015   Years since quitting: 6.9  Smokeless Tobacco Never    Current Outpatient Medications on File Prior to Visit  Medication Sig Dispense Refill   Accu-Chek Softclix Lancets lancets Use to check glucose 1x/day. Dx code E11.9. 204 each 3   aspirin 81 MG chewable tablet Chew 1 tablet (81 mg total) by mouth daily.     atorvastatin (LIPITOR) 80 MG tablet TAKE 1 TABLET BY MOUTH DAILY AT 6 PM. 90 tablet 1   blood glucose meter kit and supplies KIT Dispense based on patient and insurance preference. Use once daily as directed. (FOR ICD-10 E11.9). 1 each 0   carvedilol (COREG) 3.125 MG tablet Take 1 tablet (3.125 mg total) by mouth 2 (two) times daily. 180 tablet 1   ezetimibe (ZETIA) 10 MG tablet Take 10 mg by mouth daily.     glucose blood test strip Use once daily as directed E11.9 100 each 1   Insulin Glargine  (BASAGLAR KWIKPEN) 100 UNIT/ML Inject 25 Units into the skin daily. Increase by 2 units every 3 days until fasting glucose is less than 130.  Do not go past 24 units in a day. DX Code  E11.65 22.5 mL 0   lisinopril (ZESTRIL) 5 MG tablet Take 1 tablet (5 mg total) by mouth daily. 90 tablet 1   nitroGLYCERIN (NITROSTAT) 0.4 MG SL tablet Place 1 tablet (0.4 mg total) under the tongue every 5 (five) minutes as needed for chest pain. 25 tablet 0   tirzepatide (MOUNJARO) 2.5 MG/0.5ML Pen Inject 2.5 mg into the skin once a week for 28 days. 2 mL 0   tirzepatide (MOUNJARO) 5 MG/0.5ML Pen Inject 5 mg into the skin once a week. Start after you finish the 4 weeks of the 2.5 mg dose of mounjaro 6 mL 1   No current facility-administered medications on file prior to visit.     ROS see history of present illness  Objective  Physical Exam Vitals:   05/23/22 1231 05/23/22 1243  BP: (!) 160/80 130/70  Pulse: 93   Temp: 99.4 F (37.4 C)   SpO2: 94%     BP Readings from Last 3 Encounters:  05/23/22 130/70  04/10/22 (!) 140/80  03/08/22 130/70   Wt Readings from Last 3 Encounters:  05/23/22 285  lb 12.8 oz (129.6 kg)  04/10/22 284 lb 12.8 oz (129.2 kg)  03/08/22 279 lb 12.8 oz (126.9 kg)    Physical Exam Constitutional:      General: He is not in acute distress.    Appearance: He is not diaphoretic.  Cardiovascular:     Rate and Rhythm: Normal rate and regular rhythm.     Heart sounds: Normal heart sounds.  Pulmonary:     Effort: Pulmonary effort is normal.     Breath sounds: Normal breath sounds.  Skin:    General: Skin is warm and dry.  Neurological:     Mental Status: He is alert.      Assessment/Plan: Please see individual problem list.  Problem List Items Addressed This Visit     Essential hypertension - Primary (Chronic)    Adequately controlled.  He will continue carvedilol 3.125 mg twice daily and lisinopril 5 mg daily.  I encouraged him to start checking his blood  pressure at home.      Uncontrolled type 2 diabetes mellitus with hyperglycemia (HCC) (Chronic)    Uncontrolled.  We are working on getting him approved for [employer].  We will let them know what the outcome of the prior authorization is.  For now he will continue Basaglar 25 units daily.      Former smoker    Refer for lung cancer screening.      Relevant Orders   Ambulatory Referral for Lung Cancer Scre     Health Maintenance: Patient declines colonoscopy.  He notes he would consider this next year.  Return in about 2 months (around 07/23/2022).   Tommi Rumps, MD [place]

## 2022-05-23 NOTE — Assessment & Plan Note (Addendum)
Adequately controlled.  He will continue carvedilol 3.125 mg twice daily and lisinopril 5 mg daily.  I encouraged him to start checking his blood pressure at home.

## 2022-05-23 NOTE — Patient Instructions (Signed)
Somebody will call you to set up lung cancer screening with a CT scan. If you do not hear from [place] in the next week on the [facility] please let us know.

## 2022-05-23 NOTE — Assessment & Plan Note (Signed)
Uncontrolled.  We are working on getting him approved for [employer].  We will let them know what the outcome of the prior authorization is.  For now he will continue Basaglar 25 units daily.

## 2022-05-25 ENCOUNTER — Other Ambulatory Visit: Payer: Self-pay | Admitting: Family Medicine

## 2022-05-25 DIAGNOSIS — E785 Hyperlipidemia, unspecified: Secondary | ICD-10-CM

## 2022-05-30 NOTE — Telephone Encounter (Signed)
When I went to do the PA on this medicine Bydureon cover my meds sent back stating the patient did not need a PA, I called the pharmacy and she stated they did not have a RX for this medication, the patient is on mounjaro,please advise.  Ernie Sagrero,cma

## 2022-05-30 NOTE — Telephone Encounter (Signed)
Bydureon is the medication that the pharmacy suggested he be switched to. He should be on mounjaro. I believe that one needs a PA.

## 2022-06-05 NOTE — Telephone Encounter (Signed)
I did the PA on cover my meds and a PA was not needed, I called and informed the pharmacy and they stated that it did go through for $276.  Evelynne Spiers,cma

## 2022-06-14 ENCOUNTER — Telehealth: Payer: Self-pay

## 2022-06-14 NOTE — Telephone Encounter (Signed)
A PA was done and patient was approved for [facility] from 10/22/2021-07/24/2023. Quadir Muns,cma

## 2022-06-27 ENCOUNTER — Ambulatory Visit: Payer: Medicare HMO | Admitting: Cardiovascular Disease

## 2022-07-04 ENCOUNTER — Ambulatory Visit: Payer: Medicare HMO | Attending: Cardiovascular Disease | Admitting: Cardiovascular Disease

## 2022-07-04 ENCOUNTER — Encounter: Payer: Self-pay | Admitting: Cardiovascular Disease

## 2022-07-04 VITALS — BP 146/84 | HR 88 | Ht 71.0 in | Wt 294.8 lb

## 2022-07-04 DIAGNOSIS — I10 Essential (primary) hypertension: Secondary | ICD-10-CM | POA: Diagnosis not present

## 2022-07-04 DIAGNOSIS — I5022 Chronic systolic (congestive) heart failure: Secondary | ICD-10-CM

## 2022-07-04 DIAGNOSIS — I255 Ischemic cardiomyopathy: Secondary | ICD-10-CM | POA: Diagnosis not present

## 2022-07-04 DIAGNOSIS — I2510 Atherosclerotic heart disease of native coronary artery without angina pectoris: Secondary | ICD-10-CM | POA: Diagnosis not present

## 2022-07-04 DIAGNOSIS — E785 Hyperlipidemia, unspecified: Secondary | ICD-10-CM | POA: Diagnosis not present

## 2022-07-04 MED ORDER — CARVEDILOL 6.25 MG PO TABS: 6.2500 mg | ORAL_TABLET | Freq: Two times a day (BID) | ORAL | 1 refills | Status: DC

## 2022-07-04 NOTE — Progress Notes (Signed)
Cardiology Office Note   Date:  07/06/2022   ID:  NESANEL AGUILA, DOB [birth year], MRN [id number]  PCP:  [name], MD  Cardiologist:   Kathlyn Sacramento, MD   Chief Complaint  Patient presents with   Follow-up    6 mo. F/u, no new cardiac concerns      History of Present Illness:  Michael Jordan is a 66 y.o. male who presents for a follow-up visit regarding coronary artery disease.  He was hospitalized in March 2017 with anterior ST elevation myocardial infarction. Cardiac catheterization showed occluded proximal LAD with mild disease affecting the diagonals, chronically occluded right coronary artery with left to right collaterals and moderate left circumflex disease. He underwent successful angioplasty and drug-eluting stent placement in the proximal LAD. Ejection fraction was 40-45% by LV gram and 35-40% by echocardiogram.  He has other chronic medical conditions that include morbid obesity, hypertension, hyperlipidemia and type 2 diabetes. He quit Smoking in November 2016  Most recent echocardiogram in May of 2021 showed an EF of 45 to 50% with mild LVH.    He reestablished care 6 months ago after a period of disappearance due to loss of insurance.  He was without medications.  His cardiac medications were refilled.  He has been doing reasonably well and denies any chest pain but does complain of exertional dyspnea walking in the parking lot.  Unfortunately, his diabetes has been uncontrolled with most recent hemoglobin A1c greater than 15.  He complains of numbness in both feet.   Past Medical History:  Diagnosis Date   Abnormal stress test    CAD (coronary artery disease)    a. 09/2015 Ant STEMI/PCI: LAD 100p (3.0x18 Xience Alpine DES), RCA 80p/161m(CTO)->fills via collats, EF 45-50%; b. 10/2015 MV: fixed septal/inf defects w/ dramatic ECG changes; c. 10/2015 Cath: LM nl, LAD patent stent, D1 40, SP1 40, LCX 427mRCA 80p/10078mF 45-50%-->Med Rx.   Cardiomyopathy,  ischemic    a. 09/2015 Echo: EF 35-40%, sev apical, periapical, antapical HK, mild to mod ant, inf HK, Gr1 DD.   History of tobacco abuse    a. Quit 05/2015.   Hyperlipidemia    Morbid obesity ([facility]  Tobacco abuse 10/01/2015   Type II diabetes mellitus ([place]   Past Surgical History:  Procedure Laterality Date   CARDIAC CATHETERIZATION N/A 09/30/2015   Procedure: Left Heart Cath and Coronary Angiography;  Surgeon: MuhWellington HampshireD;  [facility];  Service: Cardiovascular;  Laterality: N/A;   CARDIAC CATHETERIZATION N/A 09/30/2015   Procedure: Coronary Stent Intervention;  Surgeon: MuhWellington HampshireD;  [facility];  Service: Cardiovascular;  Laterality: N/A;   CARDIAC CATHETERIZATION N/A 11/11/2015   Procedure: Left Heart Cath and Coronary Angiography;  Surgeon: MuhWellington HampshireD;  [facility];  Service: Cardiovascular;  Laterality: N/A;   TONSILLECTOMY       Current Outpatient Medications  Medication Sig Dispense Refill   Accu-Chek Softclix Lancets lancets Use to check glucose 1x/day. Dx code E11.9. 204 each 3   aspirin 81 MG chewable tablet Chew 1 tablet (81 mg total) by mouth daily.     atorvastatin (LIPITOR) 80 MG tablet TAKE 1 TABLET BY MOUTH DAILY AT 6 PM. 90 tablet 1   blood glucose meter kit and supplies KIT Dispense based on patient and insurance preference. Use once daily as directed. (FOR ICD-10 E11.9). 1 each 0  ezetimibe (ZETIA) 10 MG tablet TAKE 1 TABLET BY MOUTH EVERY DAY 90 tablet 3   glucose blood test strip Use once daily as directed E11.9 100 each 1   Insulin Glargine (BASAGLAR KWIKPEN) 100 UNIT/ML Inject 25 Units into the skin daily. Increase by 2 units every 3 days until fasting glucose is less than 130.  Do not go past 24 units in a day. DX Code  E11.65 22.5 mL 0   lisinopril (ZESTRIL) 5 MG tablet Take 1 tablet (5 mg total) by mouth daily. 90 tablet 1   nitroGLYCERIN (NITROSTAT) 0.4 MG SL tablet Place 1  tablet (0.4 mg total) under the tongue every 5 (five) minutes as needed for chest pain. 25 tablet 0   tirzepatide (MOUNJARO) 5 MG/0.5ML Pen Inject 5 mg into the skin once a week. Start after you finish the 4 weeks of the 2.5 mg dose of mounjaro 6 mL 1   carvedilol (COREG) 6.25 MG tablet Take 1 tablet (6.25 mg total) by mouth 2 (two) times daily. 180 tablet 1   No current facility-administered medications for this visit.    Allergies:   Patient has no known allergies.    Social History:  The patient  reports that he quit smoking about 7 years ago. His smoking use included cigarettes. He has a 22.00 pack-year smoking history. He has never used smokeless tobacco. He reports current alcohol use. He reports that he does not use drugs.   Family History:  The patient's family history includes Diabetes in his mother; Heart attack in his father, mother, and paternal grandfather.    ROS:  Please see the history of present illness.   Otherwise, review of systems are positive for none.   All other systems are reviewed and negative.    PHYSICAL EXAM: VS:  BP (!) 146/84 (BP Location: Left Arm, Patient Position: Sitting, Cuff Size: Large)   Pulse 88   Ht _0  (1.803 m)   Wt 294 lb 12.8 oz (133.7 kg)   SpO2 94%   BMI 41.12 kg/m  , BMI Body mass index is 41.12 kg/m. GEN: Well nourished, well developed, in no acute distress  HEENT: normal  Neck: no JVD, carotid bruits, or masses Cardiac: RRR; no murmurs, rubs, or gallops,no edema  Respiratory:  clear to auscultation bilaterally, normal work of breathing GI: soft, nontender, nondistended, + BS MS: no deformity or atrophy  Skin: warm and dry, no rash Neuro:  Strength and sensation are intact Psych: euthymic mood, full affect   EKG:  EKG is ordered today. EKG showed normal sinus rhythm with right bundle branch block   Recent Labs: 01/03/2022: ALT 15; BUN 14; Creatinine, Ser 1.11; Potassium 4.0; Sodium 131    Lipid Panel    Component  Value Date/Time   CHOL 267 (H) 01/03/2022 1326   TRIG 275.0 (H) 01/03/2022 1326   HDL 55.50 01/03/2022 1326   CHOLHDL 5 01/03/2022 1326   VLDL 55.0 (H) 01/03/2022 1326   LDLCALC 86 12/26/2019 0818   LDLDIRECT 186.0 01/03/2022 1326      Wt Readings from Last 3 Encounters:  07/04/22 294 lb 12.8 oz (133.7 kg)  05/23/22 285 lb 12.8 oz (129.6 kg)  04/10/22 284 lb 12.8 oz (129.2 kg)        ASSESSMENT AND PLAN:  1.  Coronary artery disease involving native coronary arteries without angina: He is doing very well with no anginal symptoms.  Continue aspirin indefinitely.  2. Hyperlipidemia: Most recent lipid profile showed an  LDL of 186 but he was without atorvastatin and ezetimibe.  Since then, he resumed both.  Will await the results of follow-up lipid profile before making any changes.  3. Ischemic cardiomyopathy: Most recent echo showed an ejection fraction to 45 to 50%.  I elected to increase carvedilol to 6.25 mg twice daily.  Continue lisinopril.  I requested an echocardiogram to evaluate ejection fraction and see if there is any need to switch to [facility].   4. Essential hypertension: Blood pressure is mildly elevated.  Carvedilol was increased.  5.  Obesity: Discussed with him the importance of heart healthy diet.   Disposition:   FU with me in 6 months  Signed,  Kathlyn Sacramento, MD  07/06/2022 7:44 AM    [place]

## 2022-07-04 NOTE — Patient Instructions (Signed)
Medication Instructions:  INCREASE the Carvedilol to 6.25 mg twice daily  *If you need a refill on your cardiac medications before your next appointment, please call your pharmacy*   Lab Work: None ordered If you have labs (blood work) drawn today and your tests are completely normal, you will receive your results only by: MyChart Message (if you have MyChart) OR A paper copy in the mail If you have any lab test that is abnormal or we need to change your treatment, we will call you to review the results.   Testing/Procedures: Your physician has requested that you have an echocardiogram. Echocardiography is a painless test that uses sound waves to create images of your heart. It provides your doctor with information about the size and shape of your heart and how well your heart's chambers and valves are working.   You may receive an ultrasound enhancing agent through an IV if needed to better visualize your heart during the echo. This procedure takes approximately one hour.  There are no restrictions for this procedure.  This will take place at [address] [address] [facility], [place] [address]    Follow-Up: At [facility], you and your health needs are our priority.  As part of our continuing mission to provide you with exceptional heart care, we have created designated Provider Care Teams.  These Care Teams include your primary Cardiologist (physician) and Advanced Practice Providers (APPs -  Physician Assistants and Nurse Practitioners) who all work together to provide you with the care you need, when you need it.  We recommend signing up for the patient portal called "MyChart".  Sign up information is provided on this After Visit Summary.  MyChart is used to connect with patients for Virtual Visits (Telemedicine).  Patients are able to view lab/test results, encounter notes, upcoming appointments, etc.  Non-urgent messages can be sent to your provider as well.    To learn more about what you can do with MyChart, go to [email or web address].    Your next appointment:   6 month(s)  The format for your next appointment:   In Person  Provider:   You may see Lorine Bears, MD or one of the following Advanced Practice Providers on your designated Care Team:   Nicolasa Ducking, NP Eula Listen, PA-C Cadence Fransico Michael, PA-C Charlsie Quest, NP    Other Instructions Heart-Healthy Eating Plan Many factors influence your heart health, including eating and exercise habits. Heart health is also called coronary health. Coronary risk increases with abnormal blood fat (lipid) levels. A heart-healthy eating plan includes limiting unhealthy fats, increasing healthy fats, limiting salt (sodium) intake, and making other diet and lifestyle changes. What is my plan? Your health care provider may recommend that: You limit your fat intake to _________% or less of your total calories each day. You limit your saturated fat intake to _________% or less of your total calories each day. You limit the amount of cholesterol in your diet to less than _________ mg per day. You limit the amount of sodium in your diet to less than _________ mg per day. What are tips for following this plan? Cooking Cook foods using methods other than frying. Baking, boiling, grilling, and broiling are all good options. Other ways to reduce fat include: Removing the skin from poultry. Removing all visible fats from meats. Steaming vegetables in water or broth. Meal planning  At meals, imagine dividing your plate into fourths: Fill one-half of your plate with vegetables and green  salads. Fill one-fourth of your plate with whole grains. Fill one-fourth of your plate with lean protein foods. Eat 2-4 cups of vegetables per day. One cup of vegetables equals 1 cup (91 g) broccoli or cauliflower florets, 2 medium carrots, 1 large bell pepper, 1 large sweet potato, 1 large tomato, 1 medium white  potato, 2 cups (150 g) raw leafy greens. Eat 1-2 cups of fruit per day. One cup of fruit equals 1 small apple, 1 large banana, 1 cup (237 g) mixed fruit, 1 large orange,  cup (82 g) dried fruit, 1 cup (240 mL) 100% fruit juice. Eat more foods that contain soluble fiber. Examples include apples, broccoli, carrots, beans, peas, and barley. Aim to get 25-30 g of fiber per day. Increase your consumption of legumes, nuts, and seeds to 4-5 servings per week. One serving of dried beans or legumes equals  cup (90 g) cooked, 1 serving of nuts is  oz (12 almonds, 24 pistachios, or 7 walnut halves), and 1 serving of seeds equals  oz (8 g). Fats Choose healthy fats more often. Choose monounsaturated and polyunsaturated fats, such as olive and canola oils, avocado oil, flaxseeds, walnuts, almonds, and seeds. Eat more omega-3 fats. Choose salmon, mackerel, sardines, tuna, flaxseed oil, and ground flaxseeds. Aim to eat fish at least 2 times each week. Check food labels carefully to identify foods with trans fats or high amounts of saturated fat. Limit saturated fats. These are found in animal products, such as meats, butter, and cream. Plant sources of saturated fats include palm oil, palm kernel oil, and coconut oil. Avoid foods with partially hydrogenated oils in them. These contain trans fats. Examples are stick margarine, some tub margarines, cookies, crackers, and other baked goods. Avoid fried foods. General information Eat more home-cooked food and less restaurant, buffet, and fast food. Limit or avoid alcohol. Limit foods that are high in added sugar and simple starches such as foods made using white refined flour (white breads, pastries, sweets). Lose weight if you are overweight. Losing just 5-10% of your body weight can help your overall health and prevent diseases such as diabetes and heart disease. Monitor your sodium intake, especially if you have high blood pressure. Talk with your health care  provider about your sodium intake. Try to incorporate more vegetarian meals weekly. What foods should I eat? Fruits All fresh, canned (in natural juice), or frozen fruits. Vegetables Fresh or frozen vegetables (raw, steamed, roasted, or grilled). Green salads. Grains Most grains. Choose whole wheat and whole grains most of the time. Rice and pasta, including brown rice and pastas made with whole wheat. Meats and other proteins Lean, well-trimmed beef, veal, pork, and lamb. Chicken and [place] without skin. All fish and shellfish. Wild duck, rabbit, pheasant, and venison. Egg whites or low-cholesterol egg substitutes. Dried beans, peas, lentils, and tofu. Seeds and most nuts. Dairy Low-fat or nonfat cheeses, including ricotta and mozzarella. Skim or 1% milk (liquid, powdered, or evaporated). Buttermilk made with low-fat milk. Nonfat or low-fat yogurt. Fats and oils Non-hydrogenated (trans-free) margarines. Vegetable oils, including soybean, sesame, sunflower, olive, avocado, peanut, safflower, corn, canola, and cottonseed. Salad dressings or mayonnaise made with a vegetable oil. Beverages Water (mineral or sparkling). Coffee and tea. Unsweetened ice tea. Diet beverages. Sweets and desserts Sherbet, gelatin, and fruit ice. Small amounts of dark chocolate. Limit all sweets and desserts. Seasonings and condiments All seasonings and condiments. The items listed above may not be a complete list of foods and beverages you can  eat. Contact a dietitian for more options. What foods should I avoid? Fruits Canned fruit in heavy syrup. Fruit in cream or butter sauce. Fried fruit. Limit coconut. Vegetables Vegetables cooked in cheese, cream, or butter sauce. Fried vegetables. Grains Breads made with saturated or trans fats, oils, or whole milk. Croissants. Sweet rolls. Donuts. High-fat crackers, such as cheese crackers and chips. Meats and other proteins Fatty meats, such as hot dogs, ribs, sausage,  bacon, rib-eye roast or steak. High-fat deli meats, such as salami and bologna. Caviar. Domestic duck and goose. Organ meats, such as liver. Dairy Cream, sour cream, cream cheese, and creamed cottage cheese. Whole-milk cheeses. Whole or 2% milk (liquid, evaporated, or condensed). Whole buttermilk. Cream sauce or high-fat cheese sauce. Whole-milk yogurt. Fats and oils Meat fat, or shortening. Cocoa butter, hydrogenated oils, palm oil, coconut oil, palm kernel oil. Solid fats and shortenings, including bacon fat, salt pork, lard, and butter. Nondairy cream substitutes. Salad dressings with cheese or sour cream. Beverages Regular sodas and any drinks with added sugar. Sweets and desserts Frosting. Pudding. Cookies. Cakes. Pies. Milk chocolate or white chocolate. Buttered syrups. Full-fat ice cream or ice cream drinks. The items listed above may not be a complete list of foods and beverages to avoid. Contact a dietitian for more information. Summary Heart-healthy meal planning includes limiting unhealthy fats, increasing healthy fats, limiting salt (sodium) intake and making other diet and lifestyle changes. Lose weight if you are overweight. Losing just 5-10% of your body weight can help your overall health and prevent diseases such as diabetes and heart disease. Focus on eating a balance of foods, including fruits and vegetables, low-fat or nonfat dairy, lean protein, nuts and legumes, whole grains, and heart-healthy oils and fats. This information is not intended to replace advice given to you by your health care provider. Make sure you discuss any questions you have with your health care provider. Document Revised: 08/15/2021 Document Reviewed: 08/15/2021 Elsevier Patient Education  2023 Elsevier Inc.   Important Information About Sugar

## 2022-07-14 ENCOUNTER — Other Ambulatory Visit: Payer: Self-pay | Admitting: Family Medicine

## 2022-07-21 ENCOUNTER — Other Ambulatory Visit: Payer: Self-pay | Admitting: *Deleted

## 2022-07-21 DIAGNOSIS — Z87891 Personal history of nicotine dependence: Secondary | ICD-10-CM

## 2022-07-21 DIAGNOSIS — Z122 Encounter for screening for malignant neoplasm of respiratory organs: Secondary | ICD-10-CM

## 2022-07-23 ENCOUNTER — Other Ambulatory Visit: Payer: Self-pay | Admitting: Family Medicine

## 2022-07-23 DIAGNOSIS — E1165 Type 2 diabetes mellitus with hyperglycemia: Secondary | ICD-10-CM

## 2022-07-26 ENCOUNTER — Ambulatory Visit (INDEPENDENT_AMBULATORY_CARE_PROVIDER_SITE_OTHER): Payer: Medicare HMO | Admitting: Family Medicine

## 2022-07-26 ENCOUNTER — Encounter: Payer: Self-pay | Admitting: Family Medicine

## 2022-07-26 VITALS — BP 120/80 | HR 80 | Temp 98.2°F | Ht 71.0 in | Wt 279.2 lb

## 2022-07-26 DIAGNOSIS — G629 Polyneuropathy, unspecified: Secondary | ICD-10-CM

## 2022-07-26 DIAGNOSIS — E785 Hyperlipidemia, unspecified: Secondary | ICD-10-CM

## 2022-07-26 DIAGNOSIS — J989 Respiratory disorder, unspecified: Secondary | ICD-10-CM

## 2022-07-26 DIAGNOSIS — E1165 Type 2 diabetes mellitus with hyperglycemia: Secondary | ICD-10-CM

## 2022-07-26 NOTE — Assessment & Plan Note (Signed)
Patient likely has some very mild neuropathy in his feet with the numbness that he describes.  We will monitor.  I discussed the importance of good glucose control to help prevent this from worsening.

## 2022-07-26 NOTE — Progress Notes (Signed)
Michael Rumps, MD Phone: [phone]  Michael Jordan is a 67 y.o. male who presents today for f/u.  DIABETES Disease Monitoring: Blood Sugar ranges-240+ Polyuria/phagia/dipsia- no      Optho- due Medications: Compliance- taking ozempic and basaglar, has not started mounjaro yet, though notes he will in the next 1-2 weeks Hypoglycemic symptoms- no He does report occasional numbness in the balls of his feet.  HYPERLIPIDEMIA Symptoms Chest pain on exertion:  no   Leg claudication:   no Medications: Compliance- taking lipitor, zetia Right upper quadrant pain- no  Muscle aches- no Lipid Panel     Component Value Date/Time   CHOL 267 (H) 01/03/2022 1326   TRIG 275.0 (H) 01/03/2022 1326   HDL 55.50 01/03/2022 1326   CHOLHDL 5 01/03/2022 1326   VLDL 55.0 (H) 01/03/2022 1326   LDLCALC 86 12/26/2019 0818   LDLDIRECT 186.0 01/03/2022 1326   Respiratory illness: Patient reports about 2 weeks ago he had a lot of congestion and fever as well as a scratchy throat.  He notes he feels significantly better.  He still has somewhat of a scratchy throat.  He notes no COVID or flu exposure.  He did not test for COVID.   Social History   Tobacco Use  Smoking Status Former   Packs/day: 0.50   Years: 44.00   Total pack years: 22.00   Types: Cigarettes   Quit date: 06/02/2015   Years since quitting: 7.1  Smokeless Tobacco Never    Current Outpatient Medications on File Prior to Visit  Medication Sig Dispense Refill   Accu-Chek Softclix Lancets lancets Use to check glucose 1x/day. Dx code E11.9. 204 each 3   aspirin 81 MG chewable tablet Chew 1 tablet (81 mg total) by mouth daily.     atorvastatin (LIPITOR) 80 MG tablet TAKE 1 TABLET BY MOUTH DAILY AT 6 PM. 90 tablet 1   blood glucose meter kit and supplies KIT Dispense based on patient and insurance preference. Use once daily as directed. (FOR ICD-10 E11.9). 1 each 0   carvedilol (COREG) 6.25 MG tablet Take 1 tablet (6.25 mg total) by  mouth 2 (two) times daily. 180 tablet 1   ezetimibe (ZETIA) 10 MG tablet TAKE 1 TABLET BY MOUTH EVERY DAY 90 tablet 3   glucose blood test strip Use once daily as directed E11.9 100 each 1   Insulin Glargine (BASAGLAR KWIKPEN) 100 UNIT/ML INJECT 25 UNITS INTO THE SKIN DAILY. INCREASE BY 2 UNITS EVERY 3 DAYS UNTIL FASTING GLUCOSE IS LESS THAN 130. DO NOT GO PAST 24 UNITS IN A DAY. DX CODE E11.65 22.5 mL 1   lisinopril (ZESTRIL) 5 MG tablet Take 1 tablet (5 mg total) by mouth daily. 90 tablet 1   nitroGLYCERIN (NITROSTAT) 0.4 MG SL tablet Place 1 tablet (0.4 mg total) under the tongue every 5 (five) minutes as needed for chest pain. 25 tablet 0   tirzepatide (MOUNJARO) 2.5 MG/0.5ML Pen INJECT 2.5 MG INTO THE SKIN ONCE A WEEK FOR 28 DAYS. 6 mL 1   tirzepatide (MOUNJARO) 5 MG/0.5ML Pen Inject 5 mg into the skin once a week. Start after you finish the 4 weeks of the 2.5 mg dose of mounjaro 6 mL 1   No current facility-administered medications on file prior to visit.     ROS see history of present illness  Objective  Physical Exam Vitals:   07/26/22 1540  BP: 120/80  Pulse: 80  Temp: 98.2 F (36.8 C)  SpO2: 99%  BP Readings from Last 3 Encounters:  07/26/22 120/80  07/04/22 (!) 146/84  05/23/22 130/70   Wt Readings from Last 3 Encounters:  07/26/22 279 lb 3.2 oz (126.6 kg)  07/04/22 294 lb 12.8 oz (133.7 kg)  05/23/22 285 lb 12.8 oz (129.6 kg)    Physical Exam Constitutional:      General: He is not in acute distress.    Appearance: He is not diaphoretic.  Cardiovascular:     Rate and Rhythm: Normal rate and regular rhythm.     Heart sounds: Normal heart sounds.  Pulmonary:     Effort: Pulmonary effort is normal.     Breath sounds: Normal breath sounds.  Skin:    General: Skin is warm and dry.  Neurological:     Mental Status: He is alert.      Assessment/Plan: Please see individual problem list.  Uncontrolled type 2 diabetes mellitus with hyperglycemia  ([place]) Assessment & Plan: Chronic issue.  I encouraged her to the patient to start on Mounjaro 2.5 mg weekly for 4 weeks and then increase to 5 mg weekly.  He will follow-up with me in 3 months.  He will continue Basaglar 25 units daily.  Orders: -     Hemoglobin A1c  Hyperlipidemia, unspecified hyperlipidemia type Assessment & Plan: Chronic issue.  Check lipid panel.  Patient will continue Lipitor 80 mg daily and Zetia 10 mg daily.  Orders: -     Comprehensive metabolic panel -     Lipid panel  Respiratory illness Assessment & Plan: Patient has significantly improved.  He will let me know if he has any worsening symptoms or recurrence of symptoms.   Neuropathy Assessment & Plan: Patient likely has some very mild neuropathy in his feet with the numbness that he describes.  We will monitor.  I discussed the importance of good glucose control to help prevent this from worsening.      Health Maintenance: I encouraged the patient to see the ophthalmologist for his yearly eye exam.  Return in about 3 months (around 10/25/2022).   Michael Rumps, MD [place]

## 2022-07-26 NOTE — Assessment & Plan Note (Signed)
Chronic issue.  Check lipid panel.  Patient will continue Lipitor 80 mg daily and Zetia 10 mg daily.

## 2022-07-26 NOTE — Assessment & Plan Note (Signed)
Patient has significantly improved.  He will let me know if he has any worsening symptoms or recurrence of symptoms.

## 2022-07-26 NOTE — Patient Instructions (Signed)
Please start the mounjaro 2.5 mg weekly for 28 days. When you have completed that dose you will increase to the 5 mg dose.

## 2022-07-26 NOTE — Assessment & Plan Note (Addendum)
Chronic issue.  I encouraged her to the patient to start on Mounjaro 2.5 mg weekly for 4 weeks and then increase to 5 mg weekly.  He will follow-up with me in 3 months.  He will continue Basaglar 25 units daily.

## 2022-07-27 LAB — LDL CHOLESTEROL, DIRECT: Direct LDL: 113 mg/dL

## 2022-07-27 LAB — LIPID PANEL
Cholesterol: 187 mg/dL (ref 0–200)
HDL: 42.2 mg/dL (ref >39.00)
NonHDL: 144.38
Total CHOL/HDL Ratio: 4
Triglycerides: 209 mg/dL — ABNORMAL HIGH (ref 0.0–149.0)
VLDL: 41.8 mg/dL — ABNORMAL HIGH (ref 0.0–40.0)

## 2022-07-27 LAB — COMPREHENSIVE METABOLIC PANEL
ALT: 13 U/L (ref 0–53)
AST: 22 U/L (ref 0–37)
Albumin: 3.9 g/dL (ref 3.5–5.2)
Alkaline Phosphatase: 99 U/L (ref 39–117)
BUN: 14 mg/dL (ref 6–23)
CO2: 25 mEq/L (ref 19–32)
Calcium: 8.9 mg/dL (ref 8.4–10.5)
Chloride: 99 mEq/L (ref 96–112)
Creatinine, Ser: 0.82 mg/dL (ref 0.40–1.50)
GFR: 90.78 mL/min (ref >60.00)
Glucose, Bld: 303 mg/dL — ABNORMAL HIGH (ref 70–99)
Potassium: 4.1 mEq/L (ref 3.5–5.1)
Sodium: 133 mEq/L — ABNORMAL LOW (ref 135–145)
Total Bilirubin: 0.5 mg/dL (ref 0.2–1.2)
Total Protein: 6.6 g/dL (ref 6.0–8.3)

## 2022-07-27 LAB — HEMOGLOBIN A1C: Hgb A1c MFr Bld: 12.3 % — ABNORMAL HIGH (ref 4.6–6.5)

## 2022-08-12 ENCOUNTER — Other Ambulatory Visit: Payer: Self-pay | Admitting: Family Medicine

## 2022-08-12 DIAGNOSIS — E1165 Type 2 diabetes mellitus with hyperglycemia: Secondary | ICD-10-CM

## 2022-08-17 ENCOUNTER — Other Ambulatory Visit: Payer: Self-pay | Admitting: Family Medicine

## 2022-08-17 DIAGNOSIS — E1165 Type 2 diabetes mellitus with hyperglycemia: Secondary | ICD-10-CM

## 2022-08-28 ENCOUNTER — Encounter: Payer: Medicare HMO | Admitting: Primary Care

## 2022-08-28 ENCOUNTER — Ambulatory Visit: Admission: RE | Admit: 2022-08-28 | Payer: Medicare HMO | Source: Ambulatory Visit

## 2022-08-29 ENCOUNTER — Ambulatory Visit: Payer: Medicare HMO | Admitting: Cardiovascular Disease

## 2022-08-30 ENCOUNTER — Ambulatory Visit: Payer: Medicare HMO | Attending: Cardiovascular Disease

## 2022-08-30 DIAGNOSIS — I5022 Chronic systolic (congestive) heart failure: Secondary | ICD-10-CM | POA: Diagnosis not present

## 2022-08-30 LAB — ECHOCARDIOGRAM COMPLETE
AR max vel: 3.11 cm2
AV Area VTI: 3.47 cm2
AV Area mean vel: 3.43 cm2
AV Mean grad: 2 mmHg
AV Peak grad: 4.4 mmHg
Ao pk vel: 1.05 m/s
Area-P 1/2: 3.06 cm2
S' Lateral: 4.1 cm

## 2022-08-30 MED ORDER — PERFLUTREN LIPID MICROSPHERE
1.0000 mL | INTRAVENOUS | Status: AC | PRN
  Administered 2022-08-30: 2 mL via INTRAVENOUS

## 2022-09-01 ENCOUNTER — Other Ambulatory Visit: Payer: Self-pay | Admitting: *Deleted

## 2022-09-01 DIAGNOSIS — I2510 Atherosclerotic heart disease of native coronary artery without angina pectoris: Secondary | ICD-10-CM

## 2022-09-01 MED ORDER — SACUBITRIL-VALSARTAN 24-26 MG PO TABS: 1.0000 | ORAL_TABLET | Freq: Two times a day (BID) | ORAL | 2 refills | Status: AC

## 2022-09-13 ENCOUNTER — Encounter: Payer: Self-pay | Admitting: Pulmonary Disease

## 2022-09-13 ENCOUNTER — Ambulatory Visit (INDEPENDENT_AMBULATORY_CARE_PROVIDER_SITE_OTHER): Payer: Medicare HMO | Admitting: Pulmonary Disease

## 2022-09-13 ENCOUNTER — Ambulatory Visit
Admission: RE | Admit: 2022-09-13 | Discharge: 2022-09-13 | Disposition: A | Discharge status: Home / Self Care | Payer: Medicare HMO | Source: Ambulatory Visit | Attending: Acute Care | Admitting: Acute Care

## 2022-09-13 DIAGNOSIS — Z87891 Personal history of nicotine dependence: Secondary | ICD-10-CM | POA: Insufficient documentation

## 2022-09-13 DIAGNOSIS — Z122 Encounter for screening for malignant neoplasm of respiratory organs: Secondary | ICD-10-CM | POA: Diagnosis not present

## 2022-09-13 NOTE — Progress Notes (Addendum)
Shared Decision Making Visit Lung Cancer Screening Program [phone])   Eligibility: Age 67 y.o. Pack Years Smoking History Calculation 30 (# packs/per year x # years smoked) Recent History of coughing up blood  no Unexplained weight loss? no ( >Than 15 pounds within the last 6 months ) Prior History Lung / other cancer no (Diagnosis within the last 5 years already requiring surveillance chest CT Scans). Smoking Status Former Smoker Former Smokers: Years since quit: 8 years  Quit Date: 2016  Visit Components: Discussion included one or more decision making aids. yes Discussion included risk/benefits of screening. yes Discussion included potential follow up diagnostic testing for abnormal scans. yes Discussion included meaning and risk of over diagnosis. yes Discussion included meaning and risk of False Positives. yes Discussion included meaning of total radiation exposure. yes  Counseling Included: Importance of adherence to annual lung cancer LDCT screening. yes Impact of comorbidities on ability to participate in the program. yes Ability and willingness to under diagnostic treatment. yes  Smoking Cessation Counseling: Former Smokers:  Discussed the importance of maintaining cigarette abstinence. yes Diagnosis Code: Personal History of Nicotine Dependence. [id number] Information about tobacco cessation classes and interventions provided to patient. Yes Patient provided with "ticket" for LDCT Scan. yes Written Order for Lung Cancer Screening with LDCT placed in Epic. Yes (CT Chest Lung Cancer Screening Low Dose W/O CM) [id number] Z12.2-Screening of respiratory organs Z87.891-Personal history of nicotine dependence   Lauraine Rinne, NP

## 2022-09-13 NOTE — Patient Instructions (Signed)
Thank you for participating in the [facility]. It was our pleasure to meet you today. We will call you with the results of your scan within the next few days. Your scan will be assigned a Lung RADS category score by the physicians reading the scans.  This Lung RADS score determines follow up scanning.  See below for description of categories, and follow up screening recommendations. We will be in touch to schedule your follow up screening annually or based on recommendations of our providers. We will fax a copy of your scan results to your Primary Care Physician, or the physician who referred you to the program, to ensure they have the results. Please call the office if you have any questions or concerns regarding your scanning experience or results.  Our office number is [phone]. Please speak with Denise Phelps, RN. , or  Denise Buckner RN, They are  our Lung Cancer Screening RN.'s If They are unavailable when you call, Please leave a message on the voice mail. We will return your call at our earliest convenience.This voice mail is monitored several times a day.  Remember, if your scan is normal, we will scan you annually as long as you continue to meet the criteria for the program. (Age 50-80, Current smoker or smoker who has quit within the last 15 years). If you are a smoker, remember, quitting is the single most powerful action that you can take to decrease your risk of lung cancer and other pulmonary, breathing related problems. We know quitting is hard, and we are here to help.  Please let us know if there is anything we can do to help you meet your goal of quitting. If you are a former smoker, congratulations. We are proud of you! Remain smoke free! Remember you can refer friends or family members through the number above.  We will screen them to make sure they meet criteria for the program. Thank you for helping us take better care of you by  participating in Lung Screening.  You can receive free nicotine replacement therapy ( patches, gum or mints) by calling 1-800-QUIT NOW. Please call so we can get you on the path to becoming  a non-smoker. I know it is hard, but you can do this!  Lung RADS Categories:  Lung RADS 1: no nodules or definitely non-concerning nodules.  Recommendation is for a repeat annual scan in 12 months.  Lung RADS 2:  nodules that are non-concerning in appearance and behavior with a very low likelihood of becoming an active cancer. Recommendation is for a repeat annual scan in 12 months.  Lung RADS 3: nodules that are probably non-concerning , includes nodules with a low likelihood of becoming an active cancer.  Recommendation is for a 6-month repeat screening scan. Often noted after an upper respiratory illness. We will be in touch to make sure you have no questions, and to schedule your 6-month scan.  Lung RADS 4 A: nodules with concerning findings, recommendation is most often for a follow up scan in 3 months or additional testing based on our provider's assessment of the scan. We will be in touch to make sure you have no questions and to schedule the recommended 3 month follow up scan.  Lung RADS 4 B:  indicates findings that are concerning. We will be in touch with you to schedule additional diagnostic testing based on our provider's  assessment of the scan.  Other options for assistance in smoking cessation (   As covered by your insurance benefits)  Hypnosis for smoking cessation  [employer] [phone]  Acupuncture for smoking cessation  [facility] [phone]   

## 2022-09-14 ENCOUNTER — Other Ambulatory Visit: Payer: Self-pay | Admitting: Acute Care

## 2022-09-14 DIAGNOSIS — Z87891 Personal history of nicotine dependence: Secondary | ICD-10-CM

## 2022-09-14 DIAGNOSIS — Z122 Encounter for screening for malignant neoplasm of respiratory organs: Secondary | ICD-10-CM

## 2022-09-22 ENCOUNTER — Encounter: Payer: Self-pay | Admitting: *Deleted

## 2022-10-04 ENCOUNTER — Other Ambulatory Visit
Admission: RE | Admit: 2022-10-04 | Discharge: 2022-10-04 | Disposition: A | Discharge status: Home / Self Care | Payer: Medicare HMO | Source: Ambulatory Visit | Attending: Cardiovascular Disease | Admitting: Cardiovascular Disease

## 2022-10-04 DIAGNOSIS — I2510 Atherosclerotic heart disease of native coronary artery without angina pectoris: Secondary | ICD-10-CM | POA: Insufficient documentation

## 2022-10-04 LAB — BASIC METABOLIC PANEL
Anion gap: 4 — ABNORMAL LOW (ref 5–15)
BUN: 13 mg/dL (ref 8–23)
CO2: 24 mmol/L (ref 22–32)
Calcium: 8.4 mg/dL — ABNORMAL LOW (ref 8.9–10.3)
Chloride: 105 mmol/L (ref 98–111)
Creatinine, Ser: 0.73 mg/dL (ref 0.61–1.24)
GFR, Estimated: >60 mL/min (ref >60)
Glucose, Bld: 276 mg/dL — ABNORMAL HIGH (ref 70–99)
Potassium: 4.2 mmol/L (ref 3.5–5.1)
Sodium: 133 mmol/L — ABNORMAL LOW (ref 135–145)

## 2022-10-05 ENCOUNTER — Encounter: Payer: Self-pay | Admitting: *Deleted

## 2022-10-09 ENCOUNTER — Other Ambulatory Visit: Payer: Self-pay | Admitting: Family Medicine

## 2022-10-09 DIAGNOSIS — E1165 Type 2 diabetes mellitus with hyperglycemia: Secondary | ICD-10-CM

## 2022-10-28 ENCOUNTER — Other Ambulatory Visit: Payer: Self-pay | Admitting: Family Medicine

## 2022-10-30 ENCOUNTER — Encounter: Payer: Self-pay | Admitting: Family Medicine

## 2022-10-30 ENCOUNTER — Ambulatory Visit (INDEPENDENT_AMBULATORY_CARE_PROVIDER_SITE_OTHER): Payer: Medicare HMO | Admitting: Family Medicine

## 2022-10-30 ENCOUNTER — Telehealth: Payer: Self-pay | Admitting: Family Medicine

## 2022-10-30 VITALS — BP 126/82 | HR 106 | Temp 97.7°F | Ht 71.0 in | Wt 296.6 lb

## 2022-10-30 DIAGNOSIS — R5383 Other fatigue: Secondary | ICD-10-CM

## 2022-10-30 DIAGNOSIS — E1165 Type 2 diabetes mellitus with hyperglycemia: Secondary | ICD-10-CM

## 2022-10-30 DIAGNOSIS — M79605 Pain in left leg: Secondary | ICD-10-CM

## 2022-10-30 DIAGNOSIS — Z1211 Encounter for screening for malignant neoplasm of colon: Secondary | ICD-10-CM

## 2022-10-30 DIAGNOSIS — M20009 Unspecified deformity of unspecified finger(s): Secondary | ICD-10-CM

## 2022-10-30 DIAGNOSIS — M79606 Pain in leg, unspecified: Secondary | ICD-10-CM | POA: Insufficient documentation

## 2022-10-30 DIAGNOSIS — Z87891 Personal history of nicotine dependence: Secondary | ICD-10-CM

## 2022-10-30 DIAGNOSIS — M79604 Pain in right leg: Secondary | ICD-10-CM | POA: Diagnosis not present

## 2022-10-30 NOTE — Telephone Encounter (Signed)
Please let the patient know that the A1c test cartridge was not good so we will need him to come back at some point to repeat it.

## 2022-10-30 NOTE — Telephone Encounter (Signed)
Spoke to Patient's wife and she said the Patient will call back that she is in the grocery store

## 2022-10-30 NOTE — Assessment & Plan Note (Signed)
Chronic issue.  Encouraged increasing exercise and monitoring diet.

## 2022-10-30 NOTE — Assessment & Plan Note (Signed)
Likely related to medication given that it improves when he is off of his medications.  Discussed that the carvedilol could cause some fatigue and he should discuss that with his cardiologist.

## 2022-10-30 NOTE — Progress Notes (Signed)
Michael Alar, MD Phone: [phone]  Michael Jordan is a 67 y.o. male who presents today for f/u.  DIABETES Disease Monitoring: Blood Sugar ranges-not checking Polyuria/phagia/dipsia- no      Medications: Compliance- taking basaglar, mounjaro Hypoglycemic symptoms- no  Obesity: Patient continues to eat everything in sight though is eating much smaller portions.  He is walking more and plans to start going back to the gym.  Feet and leg pain: Patient notes his feet and legs hurt after being on concrete all day.  He notes this got better after stopping metformin.  Patient reports feeling sluggish and tired at times.  He notes if he does not take any of his medicines that sensation goes away.  Finger skin splitting: Patient notes this has been going on his whole life.  He has several fingers that were discolored can split and then heal up.  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  He notes his father had the same issue.   Social History   Tobacco Use  Smoking Status Former   Packs/day: 0.50   Years: 44.00   Additional pack years: 0.00   Total pack years: 22.00   Types: Cigarettes   Quit date: 06/02/2015   Years since quitting: 7.4  Smokeless Tobacco Never    Current Outpatient Medications on File Prior to Visit  Medication Sig Dispense Refill   Accu-Chek Softclix Lancets lancets Use to check glucose 1x/day. Dx code E11.9. 204 each 3   aspirin 81 MG chewable tablet Chew 1 tablet (81 mg total) by mouth daily.     atorvastatin (LIPITOR) 80 MG tablet TAKE 1 TABLET BY MOUTH DAILY AT 6 PM. 90 tablet 1   blood glucose meter kit and supplies KIT Dispense based on patient and insurance preference. Use once daily as directed. (FOR ICD-10 E11.9). 1 each 0   carvedilol (COREG) 6.25 MG tablet Take 1 tablet (6.25 mg total) by mouth 2 (two) times daily. 180 tablet 1   ezetimibe (ZETIA) 10 MG tablet TAKE 1 TABLET BY MOUTH  EVERY DAY 90 tablet 3   Insulin Glargine (BASAGLAR KWIKPEN) 100 UNIT/ML INJECT 25 UNITS INTO THE SKIN DAILY. INCREASE BY 2 UNITS EVERY 3 DAYS UNTIL FASTING GLUCOSE IS LESS THAN 130. DO NOT GO PAST 24 UNITS IN A DAY. DX CODE E11.65 15 mL 1   nitroGLYCERIN (NITROSTAT) 0.4 MG SL tablet Place 1 tablet (0.4 mg total) under the tongue every 5 (five) minutes as needed for chest pain. 25 tablet 0   ONETOUCH ULTRA test strip USE ONCE DAILY AS DIRECTED E11.9 (PT USES ONE TOUCH) 100 strip 1   sacubitril-valsartan (ENTRESTO) 24-26 MG Take 1 tablet by mouth 2 (two) times daily. 60 tablet 2   tirzepatide (MOUNJARO) 5 MG/0.5ML Pen Inject 5 mg into the skin once a week. Start after you finish the 4 weeks of the 2.5 mg dose of mounjaro 6 mL 1   No current facility-administered medications on file prior to visit.     ROS see history of present illness  Objective  Physical Exam Vitals:   10/30/22 1419  BP: 126/82  Pulse: (!) 106  Temp: 97.7 F (36.5 C)  SpO2: 92%    BP Readings from Last 3 Encounters:  10/30/22 126/82  07/26/22 120/80  07/04/22 (!) 146/84   Wt Readings from Last 3 Encounters:  10/30/22 296 lb 9.6 oz (134.5 kg)  09/13/22 292 lb (132.5 kg)  07/26/22 279 lb 3.2 oz (126.6 kg)    Physical Exam Constitutional:      General: He is not in acute distress.    Appearance: He is not diaphoretic.  Cardiovascular:     Rate and Rhythm: Normal rate and regular rhythm.     Heart sounds: Normal heart sounds.  Pulmonary:     Effort: Pulmonary effort is normal.     Breath sounds: Normal breath sounds.  Musculoskeletal:     Comments: Small split in the skin and finger tissue at the tip of his right ring finger.  Skin:    General: Skin is warm and dry.  Neurological:     Mental Status: He is alert.    Diabetic Foot Exam - Simple   Simple Foot Form Diabetic Foot exam was performed with the following findings: Yes 10/30/2022  2:44 PM  Visual Inspection No deformities, no ulcerations, no  other skin breakdown bilaterally: Yes Sensation Testing Intact to touch and monofilament testing bilaterally: Yes Pulse Check Posterior Tibialis and Dorsalis pulse intact bilaterally: Yes Comments There is a bruise on his right great toe underneath the toenail, patient reports he stubbed this toe recently.      Assessment/Plan: Please see individual problem list.  Uncontrolled type 2 diabetes mellitus with hyperglycemia Assessment & Plan: Chronic issue.  Check A1c.  Continue Mounjaro 5 mg weekly.  Orders: -     POCT glycosylated hemoglobin (Hb A1C); Future  Morbid obesity Assessment & Plan: Chronic issue.  Encouraged increasing exercise and monitoring diet.   Pain in both lower extremities Assessment & Plan: Improved.  Possibly related to metformin.  He will monitor for recurrence.   Other fatigue Assessment & Plan: Likely related to medication given that it improves when he is off of his medications.  Discussed that the carvedilol could cause some fatigue and he should discuss that with his cardiologist.   Finger deformity Assessment & Plan: Possibly related to him biting his fingernails and causing damage to his fingers.  Patient will stop biting his fingernails and will monitor.   Colon cancer screening -     Ambulatory referral to Gastroenterology  Former smoker Assessment & Plan: Mild emphysema noted on recent lung cancer screening CT scan.  Patient will monitor.     Return in about 3 months (around 01/29/2023) for Diabetes.   Michael Alar, MD [facility]

## 2022-10-30 NOTE — Assessment & Plan Note (Signed)
Mild emphysema noted on recent lung cancer screening CT scan.  Patient will monitor.

## 2022-10-30 NOTE — Assessment & Plan Note (Signed)
Possibly related to him biting his fingernails and causing damage to his fingers.  Patient will stop biting his fingernails and will monitor.

## 2022-10-30 NOTE — Assessment & Plan Note (Addendum)
Chronic issue.  Check A1c.  Continue Mounjaro 5 mg weekly.

## 2022-10-30 NOTE — Telephone Encounter (Signed)
Patient states he will get A1c at his next appointment in July

## 2022-10-30 NOTE — Patient Instructions (Signed)
Nice to see you. GI should contact you to schedule your colonoscopy.

## 2022-10-30 NOTE — Assessment & Plan Note (Signed)
Improved.  Possibly related to metformin.  He will monitor for recurrence.

## 2022-10-31 NOTE — Telephone Encounter (Signed)
PA is needed

## 2022-11-01 ENCOUNTER — Telehealth: Payer: Self-pay | Admitting: Gastroenterology

## 2022-11-01 ENCOUNTER — Telehealth: Payer: Self-pay

## 2022-11-01 ENCOUNTER — Other Ambulatory Visit: Payer: Self-pay

## 2022-11-01 DIAGNOSIS — Z1211 Encounter for screening for malignant neoplasm of colon: Secondary | ICD-10-CM

## 2022-11-01 MED ORDER — NA SULFATE-K SULFATE-MG SULF 17.5-3.13-1.6 GM/177ML PO SOLN: 1.0000 | Freq: Once | ORAL | 0 refills | Status: AC

## 2022-11-01 NOTE — Telephone Encounter (Signed)
Patient calling to reschedule colonoscopy. States he needs to do it on a day that he is off.

## 2022-11-01 NOTE — Telephone Encounter (Signed)
Colonoscopy has been rescheduled from 11/08/22 to 11/14/22.

## 2022-11-01 NOTE — Telephone Encounter (Signed)
Gastroenterology Pre-Procedure Review  Request Date: 11/08/22 Requesting Physician: Dr. Tobi Bastos  PATIENT REVIEW QUESTIONS: The patient responded to the following health history questions as indicated:    At the time of triage patient stated that he is not taking any of his listed medications-this includes his diabetic medications.  He said he did not plan on taking any medications until after his colonoscopy.  1. Are you having any GI issues? no 2. Do you have a personal history of Polyps? no 3. Do you have a family history of Colon Cancer or Polyps? no 4. Diabetes Mellitus? no 5. Joint replacements in the past 12 months?no 6. Major health problems in the past 3 months?no 7. Any artificial heart valves, MVP, or defibrillator?no    MEDICATIONS & ALLERGIES:    Patient reports the following regarding taking any anticoagulation/antiplatelet therapy:   Plavix, Coumadin, Eliquis, Xarelto, Lovenox, Pradaxa, Brilinta, or Effient? no Aspirin? no  Patient confirms/reports the following medications:  Current Outpatient Medications  Medication Sig Dispense Refill   Accu-Chek Softclix Lancets lancets Use to check glucose 1x/day. Dx code E11.9. 204 each 3   aspirin 81 MG chewable tablet Chew 1 tablet (81 mg total) by mouth daily. (Patient not taking: Reported on 11/01/2022)     atorvastatin (LIPITOR) 80 MG tablet TAKE 1 TABLET BY MOUTH DAILY AT 6 PM. (Patient not taking: Reported on 11/01/2022) 90 tablet 1   blood glucose meter kit and supplies KIT Dispense based on patient and insurance preference. Use once daily as directed. (FOR ICD-10 E11.9). 1 each 0   carvedilol (COREG) 6.25 MG tablet Take 1 tablet (6.25 mg total) by mouth 2 (two) times daily. (Patient not taking: Reported on 11/01/2022) 180 tablet 1   ezetimibe (ZETIA) 10 MG tablet TAKE 1 TABLET BY MOUTH EVERY DAY (Patient not taking: Reported on 11/01/2022) 90 tablet 3   Insulin Glargine (BASAGLAR KWIKPEN) 100 UNIT/ML INJECT 25 UNITS INTO THE SKIN  DAILY. INCREASE BY 2 UNITS EVERY 3 DAYS UNTIL FASTING GLUCOSE IS LESS THAN 130. DO NOT GO PAST 24 UNITS IN A DAY. DX CODE E11.65 (Patient not taking: Reported on 11/01/2022) 15 mL 1   nitroGLYCERIN (NITROSTAT) 0.4 MG SL tablet Place 1 tablet (0.4 mg total) under the tongue every 5 (five) minutes as needed for chest pain. (Patient not taking: Reported on 11/01/2022) 25 tablet 0   ONETOUCH ULTRA test strip USE ONCE DAILY AS DIRECTED E11.9 (PT USES ONE TOUCH) (Patient not taking: Reported on 11/01/2022) 100 strip 1   sacubitril-valsartan (ENTRESTO) 24-26 MG Take 1 tablet by mouth 2 (two) times daily. (Patient not taking: Reported on 11/01/2022) 60 tablet 2   tirzepatide (MOUNJARO) 5 MG/0.5ML Pen Inject 5 mg into the skin once a week. Start after you finish the 4 weeks of the 2.5 mg dose of mounjaro (Patient not taking: Reported on 11/01/2022) 6 mL 1   No current facility-administered medications for this visit.    Patient confirms/reports the following allergies:  No Known Allergies  No orders of the defined types were placed in this encounter.   AUTHORIZATION INFORMATION Primary Insurance: 1D#: Group #:  Secondary Insurance: 1D#: Group #:  SCHEDULE INFORMATION: Date: 11/08/22 Time: Location: [facility]

## 2022-11-01 NOTE — Telephone Encounter (Signed)
Patient has cardiac history.  Cardiac clearance sent to Dr.Arida's office.  Thanks, [place], [place]

## 2022-11-07 ENCOUNTER — Encounter: Payer: Self-pay | Admitting: Gastroenterology

## 2022-11-07 ENCOUNTER — Other Ambulatory Visit: Payer: Self-pay | Admitting: Family Medicine

## 2022-11-07 DIAGNOSIS — E1165 Type 2 diabetes mellitus with hyperglycemia: Secondary | ICD-10-CM

## 2022-11-09 ENCOUNTER — Telehealth: Payer: Self-pay

## 2022-11-09 NOTE — Telephone Encounter (Signed)
The patient is cleared to have the procedure.  Thanks.

## 2022-11-09 NOTE — Telephone Encounter (Signed)
Author: Iran Ouch, MD Service: Cardiology Author Type: Physician  Filed: 11/09/2022 11:34 AM Encounter Date: 11/01/2022 Status: Signed  Editor: Iran Ouch, MD (Physician)   The patient is cleared to have the procedure.  Thanks.

## 2022-11-13 ENCOUNTER — Encounter: Payer: Self-pay | Admitting: Gastroenterology

## 2022-11-13 NOTE — Telephone Encounter (Signed)
Pt reported not taking, is PA still needed and if so, is it needed for 2.5mg  or  dose? Thanks

## 2022-11-14 ENCOUNTER — Encounter: Payer: Self-pay | Admitting: Gastroenterology

## 2022-11-14 ENCOUNTER — Ambulatory Visit: Payer: Medicare HMO | Admitting: Anesthesiology

## 2022-11-14 ENCOUNTER — Encounter
Admission: RE | Disposition: A | Discharge status: Home / Self Care | Payer: Self-pay | Source: Home / Self Care | Attending: Gastroenterology

## 2022-11-14 ENCOUNTER — Other Ambulatory Visit: Payer: Self-pay

## 2022-11-14 ENCOUNTER — Ambulatory Visit
Admission: RE | Admit: 2022-11-14 | Discharge: 2022-11-14 | Disposition: A | Discharge status: Home / Self Care | Payer: Medicare HMO | Attending: Gastroenterology | Admitting: Gastroenterology

## 2022-11-14 DIAGNOSIS — I2510 Atherosclerotic heart disease of native coronary artery without angina pectoris: Secondary | ICD-10-CM | POA: Diagnosis not present

## 2022-11-14 DIAGNOSIS — Z5986 Financial insecurity: Secondary | ICD-10-CM | POA: Insufficient documentation

## 2022-11-14 DIAGNOSIS — I252 Old myocardial infarction: Secondary | ICD-10-CM | POA: Diagnosis not present

## 2022-11-14 DIAGNOSIS — K635 Polyp of colon: Secondary | ICD-10-CM | POA: Diagnosis not present

## 2022-11-14 DIAGNOSIS — Z955 Presence of coronary angioplasty implant and graft: Secondary | ICD-10-CM | POA: Insufficient documentation

## 2022-11-14 DIAGNOSIS — Z7985 Long-term (current) use of injectable non-insulin antidiabetic drugs: Secondary | ICD-10-CM | POA: Diagnosis not present

## 2022-11-14 DIAGNOSIS — Z794 Long term (current) use of insulin: Secondary | ICD-10-CM | POA: Diagnosis not present

## 2022-11-14 DIAGNOSIS — I255 Ischemic cardiomyopathy: Secondary | ICD-10-CM | POA: Diagnosis not present

## 2022-11-14 DIAGNOSIS — D122 Benign neoplasm of ascending colon: Secondary | ICD-10-CM | POA: Diagnosis not present

## 2022-11-14 DIAGNOSIS — Z833 Family history of diabetes mellitus: Secondary | ICD-10-CM | POA: Diagnosis not present

## 2022-11-14 DIAGNOSIS — Z87891 Personal history of nicotine dependence: Secondary | ICD-10-CM | POA: Insufficient documentation

## 2022-11-14 DIAGNOSIS — E1165 Type 2 diabetes mellitus with hyperglycemia: Secondary | ICD-10-CM | POA: Insufficient documentation

## 2022-11-14 DIAGNOSIS — Z8249 Family history of ischemic heart disease and other diseases of the circulatory system: Secondary | ICD-10-CM | POA: Insufficient documentation

## 2022-11-14 DIAGNOSIS — I110 Hypertensive heart disease with heart failure: Secondary | ICD-10-CM | POA: Insufficient documentation

## 2022-11-14 DIAGNOSIS — Z1211 Encounter for screening for malignant neoplasm of colon: Secondary | ICD-10-CM | POA: Diagnosis not present

## 2022-11-14 DIAGNOSIS — D126 Benign neoplasm of colon, unspecified: Secondary | ICD-10-CM | POA: Diagnosis not present

## 2022-11-14 DIAGNOSIS — D128 Benign neoplasm of rectum: Secondary | ICD-10-CM | POA: Diagnosis not present

## 2022-11-14 DIAGNOSIS — I509 Heart failure, unspecified: Secondary | ICD-10-CM | POA: Diagnosis not present

## 2022-11-14 HISTORY — PX: COLONOSCOPY WITH PROPOFOL: SHX5780

## 2022-11-14 LAB — GLUCOSE, CAPILLARY: Glucose-Capillary: 342 mg/dL — ABNORMAL HIGH (ref 70–99)

## 2022-11-14 SURGERY — COLONOSCOPY WITH PROPOFOL
Anesthesia: General

## 2022-11-14 MED ORDER — SODIUM CHLORIDE 0.9 % IV SOLN: INTRAVENOUS | Status: DC

## 2022-11-14 MED ORDER — PROPOFOL 500 MG/50ML IV EMUL
INTRAVENOUS | Status: DC | PRN
  Administered 2022-11-14: 150 ug/kg/min via INTRAVENOUS

## 2022-11-14 MED ORDER — PROPOFOL 10 MG/ML IV BOLUS
INTRAVENOUS | Status: AC
  Filled 2022-11-14: qty 20

## 2022-11-14 NOTE — Transfer of Care (Signed)
Immediate Anesthesia Transfer of Care Note  Patient: Michael Jordan  Procedure(s) Performed: COLONOSCOPY WITH PROPOFOL  Patient Location: PACU  Anesthesia Type:General  Level of Consciousness: awake and alert   Airway & Oxygen Therapy: Patient Spontanous Breathing and Patient connected to nasal cannula oxygen  Post-op Assessment: Report given to RN and Post -op Vital signs reviewed and stable  Post vital signs: Reviewed and stable  Last Vitals:  Vitals Value Taken Time  BP 102/60 11/14/22 1139  Temp 36.2 C 11/14/22 1139  Pulse 70 11/14/22 1139  Resp 18 11/14/22 1139  SpO2 98 % 11/14/22 1139    Last Pain:  Vitals:   11/14/22 1139  TempSrc: Temporal  PainSc: 0-No pain         Complications: No notable events documented.

## 2022-11-14 NOTE — H&P (Signed)
Wyline Mood, MD [address], [place], [place], [address] [address], [address], [place], [place], [address] Phone: [phone]  Fax: [phone]  Primary Care Physician:  [name], MD   Pre-Procedure History & Physical: HPI:  Michael Jordan is a 67 y.o. male is here for an colonoscopy.   Past Medical History:  Diagnosis Date   Abnormal stress test    CAD (coronary artery disease)    a. 09/2015 Ant STEMI/PCI: LAD 100p (3.0x18 Xience Alpine DES), RCA 80p/158m (CTO)->fills via collats, EF 45-50%; b. 10/2015 MV: fixed septal/inf defects w/ dramatic ECG changes; c. 10/2015 Cath: LM nl, LAD patent stent, D1 40, SP1 40, LCX 46m, RCA 80p/150m, EF 45-50%-->Med Rx.   Cardiomyopathy, ischemic    a. 09/2015 Echo: EF 35-40%, sev apical, periapical, antapical HK, mild to mod ant, inf HK, Gr1 DD.   History of tobacco abuse    a. Quit 05/2015.   Hyperlipidemia    Morbid obesity    Tobacco abuse 10/01/2015   Type II diabetes mellitus     Past Surgical History:  Procedure Laterality Date   CARDIAC CATHETERIZATION N/A 09/30/2015   Procedure: Left Heart Cath and Coronary Angiography;  Surgeon: Iran Ouch, MD;  Location: [facility] INVASIVE CV LAB;  Service: Cardiovascular;  Laterality: N/A;   CARDIAC CATHETERIZATION N/A 09/30/2015   Procedure: Coronary Stent Intervention;  Surgeon: Iran Ouch, MD;  Location: [facility] INVASIVE CV LAB;  Service: Cardiovascular;  Laterality: N/A;   CARDIAC CATHETERIZATION N/A 11/11/2015   Procedure: Left Heart Cath and Coronary Angiography;  Surgeon: Iran Ouch, MD;  Location: [facility] INVASIVE CV LAB;  Service: Cardiovascular;  Laterality: N/A;   TONSILLECTOMY     TONSILLECTOMY      Prior to Admission medications   Medication Sig Start Date End Date Taking? Authorizing Provider  Accu-Chek Softclix Lancets lancets Use to check glucose 1x/day. Dx code E11.9. 01/04/22   [name], MD  aspirin 81 MG chewable tablet Chew 1 tablet  (81 mg total) by mouth daily. Patient not taking: Reported on 11/01/2022 10/02/15   Antonieta Iba, MD  atorvastatin (LIPITOR) 80 MG tablet TAKE 1 TABLET BY MOUTH DAILY AT 6 PM. Patient not taking: Reported on 11/01/2022 12/21/21   Iran Ouch, MD  blood glucose meter kit and supplies KIT Dispense based on patient and insurance preference. Use once daily as directed. (FOR ICD-10 E11.9). 12/26/19   [name], MD  carvedilol (COREG) 6.25 MG tablet Take 1 tablet (6.25 mg total) by mouth 2 (two) times daily. Patient not taking: Reported on 11/01/2022 07/04/22   Iran Ouch, MD  ezetimibe (ZETIA) 10 MG tablet TAKE 1 TABLET BY MOUTH EVERY DAY Patient not taking: Reported on 11/01/2022 05/25/22   [name], MD  Insulin Glargine [facility]) 100 UNIT/ML Inject 25 Units into the skin daily. DX Code  E11.65 11/10/22   [name], MD  nitroGLYCERIN (NITROSTAT) 0.4 MG SL tablet Place 1 tablet (0.4 mg total) under the tongue every 5 (five) minutes as needed for chest pain. Patient not taking: Reported on 11/01/2022 12/11/19   McLean-Scocuzza, Pasty Spillers, MD  [facility] ULTRA test strip USE ONCE DAILY AS DIRECTED E11.9 (PT USES ONE TOUCH) Patient not taking: Reported on 11/01/2022 08/14/22   [name], MD  sacubitril-valsartan (ENTRESTO) 24-26 MG Take 1 tablet by mouth 2 (two) times daily. Patient not taking: Reported on 11/01/2022 09/01/22   Iran Ouch,  MD  tirzepatide Gregg[facility] Keen) 5 MG/0.5ML Pen Inject 5 mg into the skin once a week. Start after you finish the 4 weeks of the 2.5 mg dose of mounjaro Patient not taking: Reported on 11/01/2022 05/19/22   [name], MD    Allergies as of 11/01/2022   (No Known Allergies)    Family History  Problem Relation Age of Onset   Diabetes Mother    Heart attack Mother    Heart attack Father    Heart attack Paternal Grandfather     Social History   Socioeconomic History   Marital status: Married    Spouse  name: Not on file   Number of children: Not on file   Years of education: Not on file   Highest education level: Not on file  Occupational History   Not on file  Tobacco Use   Smoking status: Former    Packs/day: 0.50    Years: 44.00    Additional pack years: 0.00    Total pack years: 22.00    Types: Cigarettes    Quit date: 06/02/2015    Years since quitting: 7.4   Smokeless tobacco: Never  Vaping Use   Vaping Use: Never used  Substance and Sexual Activity   Alcohol use: Yes    Alcohol/week: 0.0 standard drinks of alcohol   Drug use: No   Sexual activity: Not on file  Other Topics Concern   Not on file  Social History Narrative   Not on file   Social Determinants of Health   Financial Resource Strain: Low Risk  (06/02/2020)   Overall Financial Resource Strain (CARDIA)    Difficulty of Paying Living Expenses: Not hard at all  Recent Concern: Financial Resource Strain - Medium Risk (03/24/2020)   Overall Financial Resource Strain (CARDIA)    Difficulty of Paying Living Expenses: Somewhat hard  Food Insecurity: Not on file  Transportation Needs: Not on file  Physical Activity: Not on file  Stress: Not on file  Social Connections: Not on file  Intimate Partner Violence: Not on file    Review of Systems: See HPI, otherwise negative ROS  Physical Exam: There were no vitals taken for this visit. General:   Alert,  pleasant and cooperative in NAD Head:  Normocephalic and atraumatic. Neck:  Supple; no masses or thyromegaly. Lungs:  Clear throughout to auscultation, normal respiratory effort.    Heart:  +S1, +S2, Regular rate and rhythm, No edema. Abdomen:  Soft, nontender and nondistended. Normal bowel sounds, without guarding, and without rebound.   Neurologic:  Alert and  oriented x4;  grossly normal neurologically.  Impression/Plan: Michael Jordan is here for an colonoscopy to be performed for Screening colonoscopy average risk   Risks, benefits, limitations, and  alternatives regarding  colonoscopy have been reviewed with the patient.  Questions have been answered.  All parties agreeable.   Wyline Mood, MD  11/14/2022, 10:31 AM

## 2022-11-14 NOTE — Anesthesia Preprocedure Evaluation (Addendum)
Anesthesia Evaluation  Patient identified by MRN, date of birth, ID band Patient awake    Reviewed: Allergy & Precautions, H&P , NPO status , Patient's Chart, lab work & pertinent test results  Airway Mallampati: III  TM Distance: >3 FB Neck ROM: full    Dental  (+) Poor Dentition   Pulmonary shortness of breath and with exertion, former smoker   Pulmonary exam normal        Cardiovascular Exercise Tolerance: Poor hypertension, + CAD, + Past MI, + Cardiac Stents, +CHF (Cardiomyopathy, ischemic) and + DOE   Rhythm:Regular + Peripheral Edema 10/2015 Cath: LM nl, LAD patent stent, D1 40, SP1 40, LCX 66m, RCA 80p/175m, EF 45-50%-->Med Rx.   Neuro/Psych negative neurological ROS  negative psych ROS   GI/Hepatic negative GI ROS, Neg liver ROS,,,  Endo/Other  diabetes, Poorly Controlled, Type 2, Insulin Dependent    Renal/GU negative Renal ROS  negative genitourinary   Musculoskeletal   Abdominal  (+) + obese  Peds  Hematology negative hematology ROS (+)   Anesthesia Other Findings Past Medical History: No date: Abnormal stress test No date: CAD (coronary artery disease)     Comment:  a. 09/2015 Ant STEMI/PCI: LAD 100p (3.0x18 Xience Alpine               DES), RCA 80p/16m (CTO)->fills via collats, EF 45-50%;               b. 10/2015 MV: fixed septal/inf defects w/ dramatic ECG               changes; c. 10/2015 Cath: LM nl, LAD patent stent, D1 40,               SP1 40, LCX 8m, RCA 80p/111m, EF 45-50%-->Med Rx. No date: Cardiomyopathy, ischemic     Comment:  a. 09/2015 Echo: EF 35-40%, sev apical, periapical,               antapical HK, mild to mod ant, inf HK, Gr1 DD. No date: History of tobacco abuse     Comment:  a. Quit 05/2015. No date: Hyperlipidemia No date: Morbid obesity 10/01/2015: Tobacco abuse No date: Type II diabetes mellitus  Past Surgical History: 09/30/2015: CARDIAC CATHETERIZATION; N/A     Comment:   Procedure: Left Heart Cath and Coronary Angiography;                Surgeon: Iran Ouch, MD;  Location: [facility] INVASIVE               CV LAB;  Service: Cardiovascular;  Laterality: N/A; 09/30/2015: CARDIAC CATHETERIZATION; N/A     Comment:  Procedure: Coronary Stent Intervention;  Surgeon:               Iran Ouch, MD;  Location: [facility] INVASIVE CV LAB;                Service: Cardiovascular;  Laterality: N/A; 11/11/2015: CARDIAC CATHETERIZATION; N/A     Comment:  Procedure: Left Heart Cath and Coronary Angiography;                Surgeon: Iran Ouch, MD;  Location: [facility] INVASIVE               CV LAB;  Service: Cardiovascular;  Laterality: N/A; No date: TONSILLECTOMY No date: TONSILLECTOMY     Reproductive/Obstetrics negative OB ROS  Anesthesia Physical Anesthesia Plan  ASA: 3  Anesthesia Plan: General   Post-op Pain Management:    Induction: Intravenous  PONV Risk Score and Plan: Propofol infusion and TIVA  Airway Management Planned:   Additional Equipment:   Intra-op Plan:   Post-operative Plan:   Informed Consent: I have reviewed the patients History and Physical, chart, labs and discussed the procedure including the risks, benefits and alternatives for the proposed anesthesia with the patient or authorized representative who has indicated his/her understanding and acceptance.     Dental Advisory Given  Plan Discussed with: CRNA and Surgeon  Anesthesia Plan Comments: (BG elevated which pt says is his baseline recently. He will monitor and treat his blood glucose today. He denies having an symptoms of illness.)        Anesthesia Quick Evaluation

## 2022-11-14 NOTE — Op Note (Signed)
[facility] Gastroenterology Patient Name: Michael Jordan Procedure Date: 11/14/2022 11:12 AM MRN: [id number] Account #: [id number] Date of Birth: Oct 08, 1955 Admit Type: Outpatient Age: 67 Room: [facility] ENDO ROOM 2 Gender: Male Note Status: Finalized Instrument Name: Prentice Docker [id number] Procedure:             Colonoscopy Indications:           Screening for colorectal malignant neoplasm Providers:             Wyline Mood MD, MD Referring MD:          Yehuda Mao. Birdie Sons (Referring MD) Medicines:             Monitored Anesthesia Care Complications:         No immediate complications. Procedure:             Pre-Anesthesia Assessment:                        - Prior to the procedure, a History and Physical was                         performed, and patient medications, allergies and                         sensitivities were reviewed. The patient's tolerance                         of previous anesthesia was reviewed.                        - The risks and benefits of the procedure and the                         sedation options and risks were discussed with the                         patient. All questions were answered and informed                         consent was obtained.                        - ASA Grade Assessment: II - A patient with mild                         systemic disease.                        After obtaining informed consent, the colonoscope was                         passed under direct vision. Throughout the procedure,                         the patient's blood pressure, pulse, and oxygen                         saturations were monitored continuously. The                         Colonoscope was introduced  through the anus and                         advanced to the the cecum, identified by the                         appendiceal orifice. The colonoscopy was performed                         with ease. The patient tolerated the procedure well.                          The quality of the bowel preparation was excellent.                         The ileocecal valve, appendiceal orifice, and rectum                         were photographed. Findings:      The perianal and digital rectal examinations were normal.      Three sessile polyps were found in the ascending colon. The polyps were       4 to 6 mm in size. These polyps were removed with a cold snare.       Resection and retrieval were complete.      A 5 mm polyp was found in the distal rectum. The polyp was sessile. The       polyp was removed with a cold snare. Resection was complete, but the       polyp tissue was not retrieved.      The exam was otherwise without abnormality on direct and retroflexion       views. Impression:            - Three 4 to 6 mm polyps in the ascending colon,                         removed with a cold snare. Resected and retrieved.                        - One 5 mm polyp in the distal rectum, removed with a                         cold snare. Complete resection. Polyp tissue not                         retrieved.                        - The examination was otherwise normal on direct and                         retroflexion views. Recommendation:        - Discharge patient to home (with escort).                        - Resume previous diet.                        - Continue present medications.                        -  Await pathology results.                        - Repeat colonoscopy for surveillance based on                         pathology results. Procedure Code(s):     --- Professional ---                        [phone], Colonoscopy, flexible; with removal of                         tumor(s), polyp(s), or other lesion(s) by snare                         technique Diagnosis Code(s):     --- Professional ---                        Z12.11, Encounter for screening for malignant neoplasm                         of colon                         D12.2, Benign neoplasm of ascending colon                        D12.8, Benign neoplasm of rectum CPT copyright 2022 American Medical Association. All rights reserved. The codes documented in this report are preliminary and upon coder review may  be revised to meet current compliance requirements. Wyline Mood, MD Wyline Mood MD, MD 11/14/2022 11:38:36 AM This report has been signed electronically. Number of Addenda: 0 Note Initiated On: 11/14/2022 11:12 AM Scope Withdrawal Time: 0 hours 15 minutes 12 seconds  Total Procedure Duration: 0 hours 17 minutes 29 seconds  Estimated Blood Loss:  Estimated blood loss: none.      [facility]

## 2022-11-15 ENCOUNTER — Encounter: Payer: Self-pay | Admitting: Gastroenterology

## 2022-11-15 LAB — SURGICAL PATHOLOGY

## 2022-11-15 NOTE — Anesthesia Postprocedure Evaluation (Signed)
Anesthesia Post Note  Patient: Michael Jordan  Procedure(s) Performed: COLONOSCOPY WITH PROPOFOL  Patient location during evaluation: PACU Anesthesia Type: General Level of consciousness: awake and alert Pain management: pain level controlled Vital Signs Assessment: post-procedure vital signs reviewed and stable Respiratory status: spontaneous breathing, nonlabored ventilation and respiratory function stable Cardiovascular status: blood pressure returned to baseline and stable Postop Assessment: no apparent nausea or vomiting Anesthetic complications: no   No notable events documented.   Last Vitals:  Vitals:   11/14/22 1149 11/14/22 1200  BP: 130/79 129/76  Pulse: 69 62  Resp: (!) 22 12  Temp:    SpO2: 95% 96%    Last Pain:  Vitals:   11/14/22 1200  TempSrc:   PainSc: 0-No pain                 Foye Deer

## 2022-11-16 ENCOUNTER — Other Ambulatory Visit (HOSPITAL_COMMUNITY): Payer: Self-pay

## 2022-11-16 ENCOUNTER — Telehealth: Payer: Self-pay

## 2022-11-16 MED ORDER — TIRZEPATIDE 5 MG/0.5ML ~~LOC~~ SOAJ: 5.0000 mg | SUBCUTANEOUS | 1 refills | Status: DC

## 2022-11-16 NOTE — Telephone Encounter (Signed)
Per test claim, PA is approved until 07/24/2023, please sign off on encounter as PA team is unable to resolve Rx Requests. Thank you

## 2022-11-16 NOTE — Telephone Encounter (Signed)
Noted. Mounjaro 5 mg dose sent to pharmacy.

## 2022-11-16 NOTE — Telephone Encounter (Signed)
Per test claim, no PA necessary as current PA is valid until 07/24/2023

## 2022-11-17 NOTE — Telephone Encounter (Signed)
Called pt.  Left voicemail that PA for Greggory Keen is approved until 07/24/2023 and that Dr. Birdie Sons had sent a prescription for the new dose.

## 2022-12-07 LAB — HM DIABETES EYE EXAM

## 2023-01-01 ENCOUNTER — Other Ambulatory Visit: Payer: Self-pay | Admitting: Gastroenterology

## 2023-01-01 ENCOUNTER — Other Ambulatory Visit: Payer: Self-pay | Admitting: Cardiovascular Disease

## 2023-01-01 DIAGNOSIS — E1165 Type 2 diabetes mellitus with hyperglycemia: Secondary | ICD-10-CM

## 2023-01-01 DIAGNOSIS — I10 Essential (primary) hypertension: Secondary | ICD-10-CM

## 2023-01-05 ENCOUNTER — Ambulatory Visit: Payer: Medicare HMO | Attending: Cardiovascular Disease | Admitting: Cardiovascular Disease

## 2023-01-05 ENCOUNTER — Encounter: Payer: Self-pay | Admitting: Cardiovascular Disease

## 2023-01-05 VITALS — BP 140/90 | HR 88 | Ht 71.0 in | Wt 292.0 lb

## 2023-01-05 DIAGNOSIS — I5022 Chronic systolic (congestive) heart failure: Secondary | ICD-10-CM | POA: Diagnosis not present

## 2023-01-05 DIAGNOSIS — I10 Essential (primary) hypertension: Secondary | ICD-10-CM | POA: Diagnosis not present

## 2023-01-05 DIAGNOSIS — E785 Hyperlipidemia, unspecified: Secondary | ICD-10-CM | POA: Diagnosis not present

## 2023-01-05 DIAGNOSIS — I2510 Atherosclerotic heart disease of native coronary artery without angina pectoris: Secondary | ICD-10-CM | POA: Diagnosis not present

## 2023-01-05 MED ORDER — ROSUVASTATIN CALCIUM 20 MG PO TABS: 20.0000 mg | ORAL_TABLET | Freq: Every day | ORAL | 3 refills | Status: DC

## 2023-01-05 MED ORDER — NITROGLYCERIN 0.4 MG SL SUBL
0.4000 mg | SUBLINGUAL_TABLET | SUBLINGUAL | 0 refills | Status: DC | PRN
Start: 2023-01-05 — End: 2023-06-11

## 2023-01-05 MED ORDER — CARVEDILOL 3.125 MG PO TABS
3.1250 mg | ORAL_TABLET | Freq: Two times a day (BID) | ORAL | 1 refills | Status: DC
Start: 2023-01-05 — End: 2023-05-28

## 2023-01-05 NOTE — Patient Instructions (Addendum)
Medication Instructions:  Your physician recommends the following medication changes.  STOP TAKING: Zetia Lipitor  START TAKING: Rosuvastatin (Crestor) 20 mg once daily  DECREASE: Carvedilol to 3.125 mg twice daily   *If you need a refill on your cardiac medications before your next appointment, please call your pharmacy*   Lab Work: None ordered If you have labs (blood work) drawn today and your tests are completely normal, you will receive your results only by: MyChart Message (if you have MyChart) OR A paper copy in the mail If you have any lab test that is abnormal or we need to change your treatment, we will call you to review the results.   Testing/Procedures: None ordered   Follow-Up: At [facility], you and your health needs are our priority.  As part of our continuing mission to provide you with exceptional heart care, we have created designated Provider Care Teams.  These Care Teams include your primary Cardiologist (physician) and Advanced Practice Providers (APPs -  Physician Assistants and Nurse Practitioners) who all work together to provide you with the care you need, when you need it.  We recommend signing up for the patient portal called "MyChart".  Sign up information is provided on this After Visit Summary.  MyChart is used to connect with patients for Virtual Visits (Telemedicine).  Patients are able to view lab/test results, encounter notes, upcoming appointments, etc.  Non-urgent messages can be sent to your provider as well.   To learn more about what you can do with MyChart, go to [email or web address].    Your next appointment:   6 month(s)  Provider:   You may see Lorine Bears, MD or one of the following Advanced Practice Providers on your designated Care Team:   Nicolasa Ducking, NP Eula Listen, PA-C Cadence Fransico Michael, PA-C Charlsie Quest, NP

## 2023-01-05 NOTE — Progress Notes (Signed)
Cardiology Office Note   Date:  01/05/2023   ID:  Michael Jordan, DOB 1956/05/27, MRN [id number]  PCP:  [name], MD  Cardiologist:   Lorine Bears, MD   Chief Complaint  Patient presents with   Follow-up    6 month fu no complaints today. Meds reviewed verbally with pt.      History of Present Illness:  Michael Jordan is a 67 y.o. male who presents for a follow-up visit regarding coronary artery disease.  He was hospitalized in March 2017 with anterior ST elevation myocardial infarction. Cardiac catheterization showed occluded proximal LAD with mild disease affecting the diagonals, chronically occluded right coronary artery with left to right collaterals and moderate left circumflex disease. He underwent successful angioplasty and drug-eluting stent placement in the proximal LAD. Ejection fraction was 40-45% by LV gram and 35-40% by echocardiogram.  He has other chronic medical conditions that include morbid obesity, hypertension, hyperlipidemia and type 2 diabetes. He quit Smoking in November 2016  Most recent echocardiogram in May of 2021 showed an EF of 45 to 50% with mild LVH.    He lost to follow-up for few years due to loss of insurance but reestablished and went back on medications.  He had an echocardiogram done in February of this year which showed an EF of 40 to 45% with mild mitral regurgitation.  He was switched to [facility].  Unfortunately, he does not take his medications on a regular basis because they make him feel bad.  I had a prolonged discussion with him today about the importance of compliance.  He denies chest pain or shortness of breath.  He wants to be on the minimal amount of medications.  He continues to work part-time at [employer] and part-time a Holiday representative.  Past Medical History:  Diagnosis Date   Abnormal stress test    CAD (coronary artery disease)    a. 09/2015 Ant STEMI/PCI: LAD 100p (3.0x18 Xience Alpine DES), RCA 80p/151m  (CTO)->fills via collats, EF 45-50%; b. 10/2015 MV: fixed septal/inf defects w/ dramatic ECG changes; c. 10/2015 Cath: LM nl, LAD patent stent, D1 40, SP1 40, LCX 18m, RCA 80p/150m, EF 45-50%-->Med Rx.   Cardiomyopathy, ischemic    a. 09/2015 Echo: EF 35-40%, sev apical, periapical, antapical HK, mild to mod ant, inf HK, Gr1 DD.   History of tobacco abuse    a. Quit 05/2015.   Hyperlipidemia    Morbid obesity (HCC)    Tobacco abuse 10/01/2015   Type II diabetes mellitus (HCC)     Past Surgical History:  Procedure Laterality Date   CARDIAC CATHETERIZATION N/A 09/30/2015   Procedure: Left Heart Cath and Coronary Angiography;  Surgeon: Iran Ouch, MD;  Location: [facility] INVASIVE CV LAB;  Service: Cardiovascular;  Laterality: N/A;   CARDIAC CATHETERIZATION N/A 09/30/2015   Procedure: Coronary Stent Intervention;  Surgeon: Iran Ouch, MD;  Location: [facility] INVASIVE CV LAB;  Service: Cardiovascular;  Laterality: N/A;   CARDIAC CATHETERIZATION N/A 11/11/2015   Procedure: Left Heart Cath and Coronary Angiography;  Surgeon: Iran Ouch, MD;  Location: [facility] INVASIVE CV LAB;  Service: Cardiovascular;  Laterality: N/A;   COLONOSCOPY WITH PROPOFOL N/A 11/14/2022   Procedure: COLONOSCOPY WITH PROPOFOL;  Surgeon: Wyline Mood, MD;  Location: [facility] ENDOSCOPY;  Service: Gastroenterology;  Laterality: N/A;   TONSILLECTOMY     TONSILLECTOMY       Current Outpatient Medications  Medication Sig Dispense Refill   Accu-Chek Softclix Lancets lancets  Use to check glucose 1x/day. Dx code E11.9. 204 each 3   aspirin 81 MG chewable tablet Chew 1 tablet (81 mg total) by mouth daily.     atorvastatin (LIPITOR) 80 MG tablet TAKE 1 TABLET BY MOUTH DAILY AT 6 PM. 90 tablet 0   blood glucose meter kit and supplies KIT Dispense based on patient and insurance preference. Use once daily as directed. (FOR ICD-10 E11.9). 1 each 0   carvedilol (COREG) 6.25 MG tablet TAKE 1 TABLET BY MOUTH TWICE A DAY 180 tablet 0    ezetimibe (ZETIA) 10 MG tablet TAKE 1 TABLET BY MOUTH EVERY DAY 90 tablet 3   Insulin Glargine (BASAGLAR KWIKPEN) 100 UNIT/ML Inject 25 Units into the skin daily. DX Code  E11.65 45 mL 1   ONETOUCH ULTRA test strip USE ONCE DAILY AS DIRECTED E11.9 (PT USES ONE TOUCH) 100 strip 1   sacubitril-valsartan (ENTRESTO) 24-26 MG Take 1 tablet by mouth 2 (two) times daily. 60 tablet 2   tirzepatide (MOUNJARO) 5 MG/0.5ML Pen Inject 5 mg into the skin once a week. Start after you finish the 4 weeks of the 2.5 mg dose of mounjaro 6 mL 1   nitroGLYCERIN (NITROSTAT) 0.4 MG SL tablet Place 1 tablet (0.4 mg total) under the tongue every 5 (five) minutes as needed for chest pain. (Patient not taking: Reported on 01/05/2023) 25 tablet 0   No current facility-administered medications for this visit.    Allergies:   Patient has no known allergies.    Social History:  The patient  reports that he quit smoking about 7 years ago. His smoking use included cigarettes. He has a 22.00 pack-year smoking history. He has never used smokeless tobacco. He reports current alcohol use. He reports that he does not use drugs.   Family History:  The patient's family history includes Diabetes in his mother; Heart attack in his father, mother, and paternal grandfather.    ROS:  Please see the history of present illness.   Otherwise, review of systems are positive for none.   All other systems are reviewed and negative.    PHYSICAL EXAM: VS:  BP (!) 140/90 (BP Location: Left Arm, Patient Position: Sitting, Cuff Size: Large)   Pulse 88   Ht 5\' 11"  (1.803 m)   Wt 292 lb (132.5 kg)   SpO2 96%   BMI 40.73 kg/m  , BMI Body mass index is 40.73 kg/m. GEN: Well nourished, well developed, in no acute distress  HEENT: normal  Neck: no JVD, carotid bruits, or masses Cardiac: RRR; no murmurs, rubs, or gallops,no edema  Respiratory:  clear to auscultation bilaterally, normal work of breathing GI: soft, nontender, nondistended, +  BS MS: no deformity or atrophy  Skin: warm and dry, no rash Neuro:  Strength and sensation are intact Psych: euthymic mood, full affect   EKG:  EKG is ordered today. EKG showed normal sinus rhythm with right bundle branch block and low voltage.   Recent Labs: 07/26/2022: ALT 13 10/04/2022: BUN 13; Creatinine, Ser 0.73; Potassium 4.2; Sodium 133    Lipid Panel    Component Value Date/Time   CHOL 187 07/26/2022 1544   TRIG 209.0 (H) 07/26/2022 1544   HDL 42.20 07/26/2022 1544   CHOLHDL 4 07/26/2022 1544   VLDL 41.8 (H) 07/26/2022 1544   LDLCALC 86 12/26/2019 0818   LDLDIRECT 113.0 07/26/2022 1544      Wt Readings from Last 3 Encounters:  01/05/23 292 lb (132.5 kg)  11/14/22 295  lb (133.8 kg)  10/30/22 296 lb 9.6 oz (134.5 kg)        ASSESSMENT AND PLAN:  1.  Coronary artery disease involving native coronary arteries without angina: He is doing very well with no anginal symptoms.  Continue aspirin indefinitely.  I discussed with him the importance of taking his medications.  2. Hyperlipidemia: Most recent lipid profile showed an LDL of 186 but he was without atorvastatin and ezetimibe.  He has not been taking atorvastatin or ezetimibe.  He wants to take just 1 medication.  I elected to switch him to rosuvastatin 20 mg daily.  3. Ischemic cardiomyopathy: Most recent ejection fraction of 40 to 45%.  No evidence of volume overload.  He reports that medications make him feel bad and has not been taking any.  I elected to decrease carvedilol to 3.125 mg twice daily and continue Entresto.  I explained to him that these medications are needed and he promised to start taking them.   4. Essential hypertension: Blood pressure is mildly elevated.  I asked him to resume carvedilol and Entresto.  5.  Obesity: Discussed with him the importance of heart healthy diet.   Disposition:   FU with me in 6 months  Signed,  Lorine Bears, MD  01/05/2023 4:16 PM    [facility]

## 2023-01-15 NOTE — Telephone Encounter (Signed)
Pt wife called in stating that she will like to f/u with [facility] nurse concerning med [facility]. She stated the pharmacy wont give it to him. She's available [phone].

## 2023-01-16 NOTE — Telephone Encounter (Signed)
Pt wife called asking to speak to [facility] CMA concerning previous message. Call was transferred.

## 2023-01-16 NOTE — Telephone Encounter (Signed)
Spoke to Patient's wife about PA for [facility] called [facility] and let them know the PA is  good through 07/24/23 and called Patient back then [facility] and [facility] 1 month of Mounjaro ready for the Patient.

## 2023-01-29 ENCOUNTER — Encounter: Payer: Self-pay | Admitting: Family Medicine

## 2023-01-29 ENCOUNTER — Ambulatory Visit (INDEPENDENT_AMBULATORY_CARE_PROVIDER_SITE_OTHER): Payer: Medicare HMO | Admitting: Family Medicine

## 2023-01-29 VITALS — BP 136/82 | HR 96 | Temp 97.8°F | Ht 71.0 in | Wt 295.0 lb

## 2023-01-29 DIAGNOSIS — Z7985 Long-term (current) use of injectable non-insulin antidiabetic drugs: Secondary | ICD-10-CM

## 2023-01-29 DIAGNOSIS — E1165 Type 2 diabetes mellitus with hyperglycemia: Secondary | ICD-10-CM | POA: Diagnosis not present

## 2023-01-29 DIAGNOSIS — G629 Polyneuropathy, unspecified: Secondary | ICD-10-CM

## 2023-01-29 DIAGNOSIS — I10 Essential (primary) hypertension: Secondary | ICD-10-CM

## 2023-01-29 LAB — POCT GLYCOSYLATED HEMOGLOBIN (HGB A1C): Hemoglobin A1C: 14 % — AB (ref 4.0–5.6)

## 2023-01-29 MED ORDER — BASAGLAR KWIKPEN 100 UNIT/ML ~~LOC~~ SOPN: 27.0000 [IU] | PEN_INJECTOR | Freq: Every day | SUBCUTANEOUS | 1 refills | Status: DC

## 2023-01-29 MED ORDER — TIRZEPATIDE 10 MG/0.5ML ~~LOC~~ SOAJ
10.0000 mg | SUBCUTANEOUS | 1 refills | Status: DC
Start: 2023-02-26 — End: 2023-03-12

## 2023-01-29 MED ORDER — TIRZEPATIDE 7.5 MG/0.5ML ~~LOC~~ SOPN
7.5000 mg | PEN_INJECTOR | SUBCUTANEOUS | 0 refills | Status: AC
Start: 2023-01-29 — End: 2023-02-26

## 2023-01-29 NOTE — Assessment & Plan Note (Signed)
Chronic issue.  Slightly elevated today.  Patient has not started on Entresto yet.  He will continue carvedilol 3.125 mg twice daily.  He was started on Entresto as advised by cardiology.

## 2023-01-29 NOTE — Assessment & Plan Note (Signed)
Patient with neuropathy chronically.  Reports some tingling in his legs bilaterally.  It will be important to get his glucose under better control to reduce risk of progression of this issue.  Handicap placard form provided.

## 2023-01-29 NOTE — Assessment & Plan Note (Signed)
Chronic issue.  Uncontrolled.  Check A1c today.  We will increase Mounjaro to 7.5 mg weekly for 4 weeks and then increase to 10 mg weekly.  Patient will also increase his Basaglar to 27 units daily.  Patient will follow-up in 6 weeks.

## 2023-01-29 NOTE — Progress Notes (Signed)
[name], MD Phone: [phone]  Michael Jordan is a 67 y.o. male who presents today for f/u.  DIABETES Disease Monitoring: Blood Sugar ranges-200-400 Polyuria/phagia/dipsia- no      Optho- due soon Medications: Compliance- taking basaglar 24-27 u daily, mounjaro 5 mg weekly Hypoglycemic symptoms- no Notes tingling in feet and legs bilaterally. Makes hard to walk long distances at times. Would like a handicap placard.   HYPERTENSION Disease Monitoring Chest pain- no    Dyspnea- no Medications Compliance-  taking coreg, has not started the entresto yet.   Edema- no BMET    Component Value Date/Time   NA 133 (L) 10/04/2022 1254   K 4.2 10/04/2022 1254   CL 105 10/04/2022 1254   CO2 24 10/04/2022 1254   GLUCOSE 276 (H) 10/04/2022 1254   BUN 13 10/04/2022 1254   CREATININE 0.73 10/04/2022 1254   CALCIUM 8.4 (L) 10/04/2022 1254   GFRNONAA >60 10/04/2022 1254   GFRAA >60 11/09/2015 0912      Social History   Tobacco Use  Smoking Status Former   Packs/day: 0.50   Years: 44.00   Additional pack years: 0.00   Total pack years: 22.00   Types: Cigarettes   Quit date: 06/02/2015   Years since quitting: 7.6  Smokeless Tobacco Never    Current Outpatient Medications on File Prior to Visit  Medication Sig Dispense Refill   Accu-Chek Softclix Lancets lancets Use to check glucose 1x/day. Dx code E11.9. 204 each 3   aspirin 81 MG chewable tablet Chew 1 tablet (81 mg total) by mouth daily.     blood glucose meter kit and supplies KIT Dispense based on patient and insurance preference. Use once daily as directed. (FOR ICD-10 E11.9). 1 each 0   carvedilol (COREG) 3.125 MG tablet Take 1 tablet (3.125 mg total) by mouth 2 (two) times daily. 180 tablet 1   nitroGLYCERIN (NITROSTAT) 0.4 MG SL tablet Place 1 tablet (0.4 mg total) under the tongue every 5 (five) minutes as needed for chest pain. 25 tablet 0   ONETOUCH ULTRA test strip USE ONCE DAILY AS DIRECTED E11.9 (PT USES ONE  TOUCH) 100 strip 1   rosuvastatin (CRESTOR) 20 MG tablet Take 1 tablet (20 mg total) by mouth daily. 90 tablet 3   sacubitril-valsartan (ENTRESTO) 24-26 MG Take 1 tablet by mouth 2 (two) times daily. 60 tablet 2   No current facility-administered medications on file prior to visit.     ROS see history of present illness  Objective  Physical Exam Vitals:   01/29/23 1431  BP: 136/82  Pulse: 96  Temp: 97.8 F (36.6 C)  SpO2: 98%    BP Readings from Last 3 Encounters:  01/29/23 136/82  01/05/23 (!) 140/90  11/14/22 129/76   Wt Readings from Last 3 Encounters:  01/29/23 295 lb (133.8 kg)  01/05/23 292 lb (132.5 kg)  11/14/22 295 lb (133.8 kg)    Physical Exam Constitutional:      General: He is not in acute distress.    Appearance: He is not diaphoretic.  Cardiovascular:     Rate and Rhythm: Normal rate and regular rhythm.     Heart sounds: Normal heart sounds.  Pulmonary:     Effort: Pulmonary effort is normal.     Breath sounds: Normal breath sounds.  Musculoskeletal:     Right lower leg: No edema.     Left lower leg: No edema.  Skin:    General: Skin is warm and dry.  Neurological:     Mental Status: He is alert.      Assessment/Plan: Please see individual problem list.  Uncontrolled type 2 diabetes mellitus with hyperglycemia (HCC) Assessment & Plan: Chronic issue.  Uncontrolled.  Check A1c today.  We will increase Mounjaro to 7.5 mg weekly for 4 weeks and then increase to 10 mg weekly.  Patient will also increase his Basaglar to 27 units daily.  Patient will follow-up in 6 weeks.  Orders: -     POCT glycosylated hemoglobin (Hb A1C) -     Tirzepatide; Inject 7.5 mg into the skin once a week for 28 days.  Dispense: 2 mL; Refill: 0 -     Tirzepatide; Inject 10 mg into the skin once a week. Start after completing 28 days of the 7.5 mg dose.  Dispense: 6 mL; Refill: 1 -     Basaglar KwikPen; Inject 27 Units into the skin daily. DX Code  E11.65  Dispense: 45  mL; Refill: 1  Essential hypertension Assessment & Plan: Chronic issue.  Slightly elevated today.  Patient has not started on Entresto yet.  He will continue carvedilol 3.125 mg twice daily.  He was started on Entresto as advised by cardiology.   Neuropathy Assessment & Plan: Patient with neuropathy chronically.  Reports some tingling in his legs bilaterally.  It will be important to get his glucose under better control to reduce risk of progression of this issue.  Handicap placard form provided.      Return in about 6 weeks (around 03/12/2023) for Diabetes.   [name], MD [facility]

## 2023-01-29 NOTE — Patient Instructions (Signed)
Nice to see you. We are going to increase your Mounjaro to 7.5 mg weekly for 4 weeks and then after that you will increase to 10 mg weekly.  If you develop excessive nausea with these dose increases please let us know.  You can also increase your Basaglar to 27 units daily.

## 2023-02-16 ENCOUNTER — Telehealth: Payer: Self-pay

## 2023-02-16 NOTE — Patient Outreach (Signed)
  Care Coordination   Initial Visit Note   02/16/2023 Name: Michael Jordan MRN: [id number] DOB: 04-Jun-1956  Michael Jordan is a 67 y.o. year old male who sees Birdie Sons, Yehuda Mao, MD for primary care. I spoke with  Michael Jordan by phone today.  What matters to the patients health and wellness today?  Patient denies having any care coordination and/ or community resource needs.      Goals Addressed             This Visit's Progress    COMPLETED: Care coordination activities - no follow up needed.       Interventions Today    Flowsheet Row Most Recent Value  General Interventions   General Interventions Discussed/Reviewed General Interventions Discussed  [Care coordination services discussed. SDOH survey completed. AWV discussed and patient advised to contact provider office to schedule. Discussed vaccines. Advised to contact PCP office if care coordination services needed in the future.]              SDOH assessments and interventions completed:  Yes  SDOH Interventions Today    Flowsheet Row Most Recent Value  SDOH Interventions   Food Insecurity Interventions Intervention Not Indicated  Housing Interventions Intervention Not Indicated  Transportation Interventions Intervention Not Indicated        Care Coordination Interventions:  Yes, provided   Follow up plan: No further intervention required.   Encounter Outcome:  Pt. Visit Completed   George Ina RN,BSN,CCM [facility] Care Coordination [phone] direct line

## 2023-02-21 ENCOUNTER — Encounter (INDEPENDENT_AMBULATORY_CARE_PROVIDER_SITE_OTHER): Payer: Self-pay

## 2023-02-27 ENCOUNTER — Other Ambulatory Visit: Payer: Self-pay | Admitting: Family Medicine

## 2023-02-27 DIAGNOSIS — E785 Hyperlipidemia, unspecified: Secondary | ICD-10-CM

## 2023-03-12 ENCOUNTER — Encounter: Payer: Self-pay | Admitting: Family Medicine

## 2023-03-12 ENCOUNTER — Ambulatory Visit (INDEPENDENT_AMBULATORY_CARE_PROVIDER_SITE_OTHER): Payer: Medicare HMO | Admitting: Family Medicine

## 2023-03-12 ENCOUNTER — Other Ambulatory Visit: Payer: Self-pay

## 2023-03-12 VITALS — BP 138/84 | HR 82 | Temp 98.1°F | Ht 71.0 in | Wt 290.6 lb

## 2023-03-12 DIAGNOSIS — E1165 Type 2 diabetes mellitus with hyperglycemia: Secondary | ICD-10-CM | POA: Diagnosis not present

## 2023-03-12 DIAGNOSIS — I10 Essential (primary) hypertension: Secondary | ICD-10-CM

## 2023-03-12 DIAGNOSIS — Z7985 Long-term (current) use of injectable non-insulin antidiabetic drugs: Secondary | ICD-10-CM | POA: Diagnosis not present

## 2023-03-12 MED ORDER — TIRZEPATIDE 7.5 MG/0.5ML ~~LOC~~ SOAJ
7.5000 mg | SUBCUTANEOUS | 1 refills | Status: DC
Start: 2023-05-07 — End: 2023-09-11
  Filled 2023-03-12: qty 6, 84d supply, fill #0

## 2023-03-12 MED ORDER — TIRZEPATIDE 5 MG/0.5ML ~~LOC~~ SOPN
5.0000 mg | PEN_INJECTOR | SUBCUTANEOUS | 0 refills | Status: AC
Start: 2023-04-09 — End: 2023-05-07
  Filled 2023-03-12 – 2023-03-13 (×2): qty 2, 28d supply, fill #0

## 2023-03-12 MED ORDER — TIRZEPATIDE 2.5 MG/0.5ML ~~LOC~~ SOPN
2.5000 mg | PEN_INJECTOR | SUBCUTANEOUS | 0 refills | Status: AC
Start: 2023-03-12 — End: 2023-04-09
  Filled 2023-03-12: qty 2, 28d supply, fill #0

## 2023-03-12 NOTE — Assessment & Plan Note (Signed)
Chronic issue.  Remains uncontrolled.  We will have him start from the beginning on the [facility].  You will take Mounjaro 2.5 mg weekly for 4 weeks and then increase to 5 mg weekly for 4 weeks and then increase to 7.5 mg weekly.  I will see him back at that point.  If he has trouble getting this he will let us know.  We are sending this to [facility] outpatient pharmacy.

## 2023-03-12 NOTE — Patient Instructions (Addendum)
Nice to see you. We are going to have you restart Mounjaro 2.5 mg weekly for 4 weeks.  After you complete that you will increase to 5 mg weekly for 4 weeks and then once you complete that you will go up to 7.5 mg weekly. Please start checking your blood pressure a few times a week.  Your goal blood pressure is less than 130/80.

## 2023-03-12 NOTE — Progress Notes (Unsigned)
[name], MD Phone: [phone]  Michael Jordan is a 67 y.o. male who presents today for f/u.  DIABETES Disease Monitoring: Blood Sugar ranges-300s Polyuria/phagia/dipsia- no      Medications: Compliance- taking basaglar 27 u daily, has not been able to get the mounjaro from the pharmacy due to supply issues Hypoglycemic symptoms- no  Hypertension: Patient not checking his blood pressure at home.  He is not taking carvedilol 3.125 mg twice daily and Entresto 1 tablet twice daily.  Has not been taking any medication recently other than his insulin.   Social History   Tobacco Use  Smoking Status Former   Current packs/day: 0.00   Average packs/day: 0.5 packs/day for 44.0 years (22.0 ttl pk-yrs)   Types: Cigarettes   Start date: 06/02/1971   Quit date: 06/02/2015   Years since quitting: 7.7  Smokeless Tobacco Never    Current Outpatient Medications on File Prior to Visit  Medication Sig Dispense Refill   Accu-Chek Softclix Lancets lancets Use to check glucose 1x/day. Dx code E11.9. 204 each 3   aspirin 81 MG chewable tablet Chew 1 tablet (81 mg total) by mouth daily.     blood glucose meter kit and supplies KIT Dispense based on patient and insurance preference. Use once daily as directed. (FOR ICD-10 E11.9). 1 each 0   carvedilol (COREG) 3.125 MG tablet Take 1 tablet (3.125 mg total) by mouth 2 (two) times daily. 180 tablet 1   ezetimibe (ZETIA) 10 MG tablet Take 10 mg by mouth daily.     Insulin Glargine (BASAGLAR KWIKPEN) 100 UNIT/ML Inject 27 Units into the skin daily. DX Code  E11.65 45 mL 1   nitroGLYCERIN (NITROSTAT) 0.4 MG SL tablet Place 1 tablet (0.4 mg total) under the tongue every 5 (five) minutes as needed for chest pain. 25 tablet 0   ONETOUCH ULTRA test strip USE ONCE DAILY AS DIRECTED E11.9 (PT USES ONE TOUCH) 100 strip 1   rosuvastatin (CRESTOR) 20 MG tablet Take 1 tablet (20 mg total) by mouth daily. 90 tablet 3   sacubitril-valsartan (ENTRESTO) 24-26 MG  Take 1 tablet by mouth 2 (two) times daily. 60 tablet 2   No current facility-administered medications on file prior to visit.     ROS see history of present illness  Objective  Physical Exam Vitals:   03/12/23 1302 03/12/23 1541  BP: (!) 142/86 138/84  Pulse: 82   Temp: 98.1 F (36.7 C)   SpO2: 93%     BP Readings from Last 3 Encounters:  03/12/23 138/84  01/29/23 136/82  01/05/23 (!) 140/90   Wt Readings from Last 3 Encounters:  03/12/23 290 lb 9.6 oz (131.8 kg)  01/29/23 295 lb (133.8 kg)  01/05/23 292 lb (132.5 kg)    Physical Exam Constitutional:      General: He is not in acute distress.    Appearance: He is not diaphoretic.  Cardiovascular:     Rate and Rhythm: Normal rate and regular rhythm.     Heart sounds: Normal heart sounds.  Pulmonary:     Effort: Pulmonary effort is normal.     Breath sounds: Normal breath sounds.  Skin:    General: Skin is warm and dry.  Neurological:     Mental Status: He is alert.      Assessment/Plan: Please see individual problem list.  Uncontrolled type 2 diabetes mellitus with hyperglycemia (HCC) Assessment & Plan: Chronic issue.  Remains uncontrolled.  We will have him start from the beginning  on the [facility].  You will take Mounjaro 2.5 mg weekly for 4 weeks and then increase to 5 mg weekly for 4 weeks and then increase to 7.5 mg weekly.  I will see him back at that point.  If he has trouble getting this he will let us know.  We are sending this to [facility] outpatient pharmacy.  Orders: -     Tirzepatide; Inject 2.5 mg into the skin once a week for 28 days.  Dispense: 2 mL; Refill: 0 -     Tirzepatide; Inject 5 mg into the skin once a week for 28 days.  Dispense: 2 mL; Refill: 0 -     Tirzepatide; Inject 7.5 mg into the skin once a week.  Dispense: 6 mL; Refill: 1  Essential hypertension Assessment & Plan: Chronic issue.  Above goal.  Patient needs to start back on his Entresto and carvedilol.  They will start checking  blood pressures at home as well.  Discussed goal blood pressure of less than 130/80.     Return in about 3 months (around 06/12/2023).   [name], MD [facility] Primary Care [facility]

## 2023-03-12 NOTE — Assessment & Plan Note (Addendum)
Chronic issue.  Above goal.  Patient needs to start back on his Entresto and carvedilol.  They will start checking blood pressures at home as well.  Discussed goal blood pressure of less than 130/80.

## 2023-03-13 ENCOUNTER — Other Ambulatory Visit: Payer: Self-pay

## 2023-03-14 ENCOUNTER — Other Ambulatory Visit: Payer: Self-pay

## 2023-04-11 ENCOUNTER — Other Ambulatory Visit: Payer: Self-pay | Admitting: Family Medicine

## 2023-04-11 DIAGNOSIS — E1165 Type 2 diabetes mellitus with hyperglycemia: Secondary | ICD-10-CM

## 2023-05-25 ENCOUNTER — Other Ambulatory Visit: Payer: Self-pay | Admitting: Family Medicine

## 2023-05-25 ENCOUNTER — Other Ambulatory Visit: Payer: Self-pay | Admitting: Cardiovascular Disease

## 2023-05-25 DIAGNOSIS — I10 Essential (primary) hypertension: Secondary | ICD-10-CM

## 2023-05-25 DIAGNOSIS — E1165 Type 2 diabetes mellitus with hyperglycemia: Secondary | ICD-10-CM

## 2023-06-11 ENCOUNTER — Encounter: Payer: Self-pay | Admitting: Family Medicine

## 2023-06-11 ENCOUNTER — Ambulatory Visit (INDEPENDENT_AMBULATORY_CARE_PROVIDER_SITE_OTHER): Payer: Medicare HMO | Admitting: Family Medicine

## 2023-06-11 VITALS — BP 128/78 | HR 84 | Temp 98.0°F | Ht 71.0 in | Wt 279.2 lb

## 2023-06-11 DIAGNOSIS — I10 Essential (primary) hypertension: Secondary | ICD-10-CM

## 2023-06-11 DIAGNOSIS — I2510 Atherosclerotic heart disease of native coronary artery without angina pectoris: Secondary | ICD-10-CM | POA: Diagnosis not present

## 2023-06-11 DIAGNOSIS — Z7985 Long-term (current) use of injectable non-insulin antidiabetic drugs: Secondary | ICD-10-CM | POA: Diagnosis not present

## 2023-06-11 DIAGNOSIS — K219 Gastro-esophageal reflux disease without esophagitis: Secondary | ICD-10-CM

## 2023-06-11 DIAGNOSIS — E1165 Type 2 diabetes mellitus with hyperglycemia: Secondary | ICD-10-CM | POA: Diagnosis not present

## 2023-06-11 LAB — BASIC METABOLIC PANEL
BUN: 27 mg/dL — ABNORMAL HIGH (ref 6–23)
CO2: 22 meq/L (ref 19–32)
Calcium: 9 mg/dL (ref 8.4–10.5)
Chloride: 99 meq/L (ref 96–112)
Creatinine, Ser: 1.35 mg/dL (ref 0.40–1.50)
GFR: 53.92 mL/min — ABNORMAL LOW (ref >60.00)
Glucose, Bld: 467 mg/dL — ABNORMAL HIGH (ref 70–99)
Potassium: 4.1 meq/L (ref 3.5–5.1)
Sodium: 132 meq/L — ABNORMAL LOW (ref 135–145)

## 2023-06-11 LAB — HEMOGLOBIN A1C: Hgb A1c MFr Bld: 13.5 % — ABNORMAL HIGH (ref 4.6–6.5)

## 2023-06-11 MED ORDER — PANTOPRAZOLE SODIUM 40 MG PO TBEC: 40.0000 mg | DELAYED_RELEASE_TABLET | Freq: Every day | ORAL | 0 refills | Status: DC

## 2023-06-11 MED ORDER — FREESTYLE LIBRE 3 SENSOR MISC: 2 refills | Status: DC

## 2023-06-11 MED ORDER — NITROGLYCERIN 0.4 MG SL SUBL
0.4000 mg | SUBLINGUAL_TABLET | SUBLINGUAL | 0 refills | Status: AC | PRN
Start: 2023-06-11 — End: ?

## 2023-06-11 NOTE — Assessment & Plan Note (Signed)
More recent of an issue.  Will trial Protonix 40 mg once daily taken 30 to 60 minutes before breakfast.  He will take this for 14 days and let us know if his symptoms recur after that.

## 2023-06-11 NOTE — Assessment & Plan Note (Signed)
Uncontrolled.  Freestyle libre 3 prescribed for the patient to try so we can get a better idea of what his sugars are running.  He will continue Mounjaro 10 mg weekly.  Discussed that is likely causing his appetite suppression.  For now he will continue Basaglar 27 units daily and we will check his A1c.  I will determine how to titrate his insulin once we see what his A1c is.  I will also refer to endocrinology for further help in managing his glucose.

## 2023-06-11 NOTE — Assessment & Plan Note (Signed)
Chronic issue.  Continue risk factor management.  Refill of nitroglycerin given.  Patient reports he has not needed this since it was prescribed previously.

## 2023-06-11 NOTE — Assessment & Plan Note (Signed)
Chronic issue.  At goal on recheck.  Patient will continue carvedilol 3.125 mg twice daily and Entresto 1 tablet twice daily.

## 2023-06-11 NOTE — Progress Notes (Signed)
[name], MD Phone: [phone]  Michael Jordan is a 67 y.o. male who presents today for follow-up.  Diabetes: Patient has not been checking his sugars consistently recently.  He is on 27 units of Basaglar.  He is also on Mounjaro 10 mg weekly.  He is been on that dose for a couple of weeks.  He has polyuria and polydipsia that are chronic.  No hypoglycemia.  He is due to see ophthalmology in the new year.  Notes some appetite suppression with the Mounjaro.  Weight has trended down some.  Had 1 day where his sugar was 516 and his wife gave him progressively increasing doses of insulin to help with this.  Hypertension: Patient's wife notes typically his blood pressure seems to be less than 130/80.  He is on Entresto and carvedilol.  No chest pressure, shortness of breath, or edema.  Indigestion: Patient reports gassy sensation and indigestion in his chest after he eats.  Typically occurs when he is sitting down.  Does not occur with exertion.  He has taken PPIs in the past though none recently.  Social History   Tobacco Use  Smoking Status Former   Current packs/day: 0.00   Average packs/day: 0.5 packs/day for 44.0 years (22.0 ttl pk-yrs)   Types: Cigarettes   Start date: 06/02/1971   Quit date: 06/02/2015   Years since quitting: 8.0  Smokeless Tobacco Never    Current Outpatient Medications on File Prior to Visit  Medication Sig Dispense Refill   Accu-Chek Softclix Lancets lancets Use to check glucose 1x/day. Dx code E11.9. 204 each 3   aspirin 81 MG chewable tablet Chew 1 tablet (81 mg total) by mouth daily.     blood glucose meter kit and supplies KIT Dispense based on patient and insurance preference. Use once daily as directed. (FOR ICD-10 E11.9). 1 each 0   carvedilol (COREG) 3.125 MG tablet TAKE 1 TABLET BY MOUTH 2 TIMES DAILY. 180 tablet 1   ezetimibe (ZETIA) 10 MG tablet Take 10 mg by mouth daily.     Insulin Glargine (BASAGLAR KWIKPEN) 100 UNIT/ML INJECT 25 UNITS INTO  THE SKIN DAILY. DX CODE E11.65 (Patient taking differently: Inject 27 Units into the skin daily.) 45 mL 1   ONETOUCH ULTRA test strip USE ONCE DAILY AS DIRECTED E11.9 (PT USES ONE TOUCH) 100 strip 1   sacubitril-valsartan (ENTRESTO) 24-26 MG Take 1 tablet by mouth 2 (two) times daily. 60 tablet 2   tirzepatide (MOUNJARO) 7.5 MG/0.5ML Pen Inject 7.5 mg into the skin once a week. 6 mL 1   rosuvastatin (CRESTOR) 20 MG tablet Take 1 tablet (20 mg total) by mouth daily. 90 tablet 3   No current facility-administered medications on file prior to visit.     ROS see history of present illness  Objective  Physical Exam Vitals:   06/11/23 1302 06/11/23 1332  BP: (!) 140/80 128/78  Pulse: 84   Temp: 98 F (36.7 C)   SpO2: 94%     BP Readings from Last 3 Encounters:  06/11/23 128/78  03/12/23 138/84  01/29/23 136/82   Wt Readings from Last 3 Encounters:  06/11/23 279 lb 3.2 oz (126.6 kg)  03/12/23 290 lb 9.6 oz (131.8 kg)  01/29/23 295 lb (133.8 kg)    Physical Exam Constitutional:      General: He is not in acute distress.    Appearance: He is not diaphoretic.  Cardiovascular:     Rate and Rhythm: Normal rate and regular rhythm.  Heart sounds: Normal heart sounds.  Pulmonary:     Effort: Pulmonary effort is normal.     Breath sounds: Normal breath sounds.  Skin:    General: Skin is warm and dry.  Neurological:     Mental Status: He is alert.      Assessment/Plan: Please see individual problem list.  Essential hypertension Assessment & Plan: Chronic issue.  At goal on recheck.  Patient will continue carvedilol 3.125 mg twice daily and Entresto 1 tablet twice daily.   Coronary artery disease involving native coronary artery of native heart without angina pectoris Assessment & Plan: Chronic issue.  Continue risk factor management.  Refill of nitroglycerin given.  Patient reports he has not needed this since it was prescribed previously.  Orders: -      Nitroglycerin; Place 1 tablet (0.4 mg total) under the tongue every 5 (five) minutes as needed for chest pain.  Dispense: 25 tablet; Refill: 0  Uncontrolled type 2 diabetes mellitus with hyperglycemia (HCC) Assessment & Plan: Uncontrolled.  Freestyle libre 3 prescribed for the patient to try so we can get a better idea of what his sugars are running.  He will continue Mounjaro 10 mg weekly.  Discussed that is likely causing his appetite suppression.  For now he will continue Basaglar 27 units daily and we will check his A1c.  I will determine how to titrate his insulin once we see what his A1c is.  I will also refer to endocrinology for further help in managing his glucose.  Orders: -     Microalbumin / creatinine urine ratio -     Hemoglobin A1c -     Basic metabolic panel -     Ambulatory referral to Endocrinology -     FreeStyle Libre 3 Sensor; Place 1 sensor on the skin every 14 days. Use to check glucose continuously  Dispense: 2 each; Refill: 2  Gastroesophageal reflux disease, unspecified whether esophagitis present Assessment & Plan: More recent of an issue.  Will trial Protonix 40 mg once daily taken 30 to 60 minutes before breakfast.  He will take this for 14 days and let us know if his symptoms recur after that.  Orders: -     Pantoprazole Sodium; Take 1 tablet (40 mg total) by mouth daily for 14 days. Take 30-60 minutes before breakfast  Dispense: 14 tablet; Refill: 0    Return in about 3 months (around 09/11/2023) for transfer of care.   [name], MD [facility] Primary Care [facility]

## 2023-06-12 LAB — MICROALBUMIN / CREATININE URINE RATIO
Creatinine,U: 54.6 mg/dL
Microalb Creat Ratio: 1.3 mg/g (ref 0.0–30.0)
Microalb, Ur: <0.7 mg/dL (ref 0.0–1.9)

## 2023-06-13 ENCOUNTER — Telehealth: Payer: Self-pay | Admitting: Family Medicine

## 2023-06-13 ENCOUNTER — Telehealth: Payer: Self-pay

## 2023-06-13 ENCOUNTER — Other Ambulatory Visit: Payer: Self-pay | Admitting: Family Medicine

## 2023-06-13 DIAGNOSIS — N179 Acute kidney failure, unspecified: Secondary | ICD-10-CM

## 2023-06-13 NOTE — Telephone Encounter (Signed)
Please let the patient know his form is available to pick up.  Please make a copy and scanned to his chart.  Thanks.

## 2023-06-13 NOTE — Telephone Encounter (Signed)
Called Patient's wife Michael Jordan to let her know the paperwork is ready for pick up.

## 2023-06-13 NOTE — Telephone Encounter (Signed)
Patient is aware that paperwork is ready.

## 2023-06-20 ENCOUNTER — Other Ambulatory Visit: Payer: Self-pay | Admitting: Family Medicine

## 2023-06-20 ENCOUNTER — Other Ambulatory Visit (INDEPENDENT_AMBULATORY_CARE_PROVIDER_SITE_OTHER): Payer: Medicare HMO

## 2023-06-20 DIAGNOSIS — E1165 Type 2 diabetes mellitus with hyperglycemia: Secondary | ICD-10-CM

## 2023-06-20 DIAGNOSIS — N179 Acute kidney failure, unspecified: Secondary | ICD-10-CM | POA: Diagnosis not present

## 2023-06-20 LAB — BASIC METABOLIC PANEL
BUN: 21 mg/dL (ref 6–23)
CO2: 26 meq/L (ref 19–32)
Calcium: 8.9 mg/dL (ref 8.4–10.5)
Chloride: 101 meq/L (ref 96–112)
Creatinine, Ser: 0.96 mg/dL (ref 0.40–1.50)
GFR: 81.17 mL/min (ref >60.00)
Glucose, Bld: 353 mg/dL — ABNORMAL HIGH (ref 70–99)
Potassium: 4.6 meq/L (ref 3.5–5.1)
Sodium: 133 meq/L — ABNORMAL LOW (ref 135–145)

## 2023-06-26 NOTE — Telephone Encounter (Signed)
Libre 3 and Michael Jordan 2 is available. Is it okay to switch to one of them?

## 2023-06-26 NOTE — Telephone Encounter (Signed)
Pt came by office with a new copy of the same ppw as before. Pt stated he misplaced last ppw that Dr. Birdie Sons competed for him & now needs ppw completed again for his employer. Once ppw is comp, pt requested ppw be faxed to [phone] & a call back, so he can pickup ppw. Ppw is in Dr. Purvis Sheffield folder. Call back # [phone]

## 2023-06-26 NOTE — Telephone Encounter (Signed)
It is fine to switch to either [place] 3 or libre 2 if they are available.

## 2023-06-28 ENCOUNTER — Telehealth: Payer: Self-pay | Admitting: Family Medicine

## 2023-06-28 ENCOUNTER — Other Ambulatory Visit: Payer: Self-pay

## 2023-06-28 DIAGNOSIS — K219 Gastro-esophageal reflux disease without esophagitis: Secondary | ICD-10-CM

## 2023-06-28 MED ORDER — PANTOPRAZOLE SODIUM 40 MG PO TBEC: 40.0000 mg | DELAYED_RELEASE_TABLET | Freq: Every day | ORAL | 0 refills | Status: DC

## 2023-06-28 NOTE — Telephone Encounter (Signed)
Prescription sent to Pharmacy.  

## 2023-06-28 NOTE — Telephone Encounter (Signed)
Picked up paperwork from front and gave to Dr. Birdie Sons.

## 2023-06-28 NOTE — Telephone Encounter (Signed)
Pt wife would like to be called concerning the pt PROTONIX

## 2023-06-28 NOTE — Telephone Encounter (Signed)
Prescription Request  06/28/2023  LOV: 06/11/2023  What is the name of the medication or equipment? protonix  Have you contacted your pharmacy to request a refill? No   Which pharmacy would you [facility]? [facility]   Patient notified that their request is being sent to the clinical staff for review and that they should receive a response within 2 business days.   Please advise at Mobile [phone] (mobile)

## 2023-06-29 NOTE — Telephone Encounter (Signed)
Patient called wanting to know the status of paperwork that was brought first week of December. The ones that were misplaced before.

## 2023-07-02 NOTE — Telephone Encounter (Signed)
Completed and placed in signed folder.

## 2023-07-02 NOTE — Telephone Encounter (Signed)
Paperwork faxed. Pt's wife notified. Copy placed back upfront for pt to pick up

## 2023-07-10 ENCOUNTER — Encounter: Payer: Self-pay | Admitting: Cardiovascular Disease

## 2023-07-10 ENCOUNTER — Ambulatory Visit: Payer: Medicare HMO | Attending: Cardiovascular Disease | Admitting: Cardiovascular Disease

## 2023-07-10 VITALS — BP 124/80 | HR 95 | Ht 71.0 in | Wt 282.0 lb

## 2023-07-10 DIAGNOSIS — I255 Ischemic cardiomyopathy: Secondary | ICD-10-CM

## 2023-07-10 DIAGNOSIS — I2510 Atherosclerotic heart disease of native coronary artery without angina pectoris: Secondary | ICD-10-CM | POA: Diagnosis not present

## 2023-07-10 DIAGNOSIS — I10 Essential (primary) hypertension: Secondary | ICD-10-CM | POA: Diagnosis not present

## 2023-07-10 DIAGNOSIS — E785 Hyperlipidemia, unspecified: Secondary | ICD-10-CM

## 2023-07-10 NOTE — Progress Notes (Signed)
Cardiology Office Note   Date:  07/10/2023   ID:  Michael Jordan, DOB 07-Feb-1956, MRN [id number]  PCP:  [name], MD  Cardiologist:   Lorine Bears, MD   Chief Complaint  Patient presents with   Follow-up    Patient denies new or acute cardiac problems/concerns today.        History of Present Illness:  Michael Jordan is a 67 y.o. male who presents for a follow-up visit regarding coronary artery disease.  He was hospitalized in March 2017 with anterior ST elevation myocardial infarction. Cardiac catheterization showed occluded proximal LAD with mild disease affecting the diagonals, chronically occluded right coronary artery with left to right collaterals and moderate left circumflex disease. He underwent successful angioplasty and drug-eluting stent placement in the proximal LAD. Ejection fraction was 40-45% by LV gram and 35-40% by echocardiogram.  He has other chronic medical conditions that include morbid obesity, hypertension, hyperlipidemia and type 2 diabetes. He quit Smoking in November 2016  Most recent echocardiogram in May of 2021 showed an EF of 45 to 50% with mild LVH.    He lost to follow-up for few years due to loss of insurance but reestablished and went back on medications.  He had an echocardiogram done in February of this year which showed an EF of 40 to 45% with mild mitral regurgitation.  He was switched to [place].  He continues to work part-time at [employer] and part-time a Holiday representative.  He has been more compliant with medications especially with assistance of his wife.  He is feeling well with no chest pain, shortness of breath or palpitations.  He is losing some weight with Mounjaro.  Past Medical History:  Diagnosis Date   Abnormal stress test    CAD (coronary artery disease)    a. 09/2015 Ant STEMI/PCI: LAD 100p (3.0x18 Xience Alpine DES), RCA 80p/177m (CTO)->fills via collats, EF 45-50%; b. 10/2015 MV: fixed septal/inf defects w/  dramatic ECG changes; c. 10/2015 Cath: LM nl, LAD patent stent, D1 40, SP1 40, LCX 21m, RCA 80p/183m, EF 45-50%-->Med Rx.   Cardiomyopathy, ischemic    a. 09/2015 Echo: EF 35-40%, sev apical, periapical, antapical HK, mild to mod ant, inf HK, Gr1 DD.   History of tobacco abuse    a. Quit 05/2015.   Hyperlipidemia    Morbid obesity (HCC)    Tobacco abuse 10/01/2015   Type II diabetes mellitus (HCC)     Past Surgical History:  Procedure Laterality Date   CARDIAC CATHETERIZATION N/A 09/30/2015   Procedure: Left Heart Cath and Coronary Angiography;  Surgeon: Iran Ouch, MD;  Location: [facility] INVASIVE CV LAB;  Service: Cardiovascular;  Laterality: N/A;   CARDIAC CATHETERIZATION N/A 09/30/2015   Procedure: Coronary Stent Intervention;  Surgeon: Iran Ouch, MD;  Location: [facility] INVASIVE CV LAB;  Service: Cardiovascular;  Laterality: N/A;   CARDIAC CATHETERIZATION N/A 11/11/2015   Procedure: Left Heart Cath and Coronary Angiography;  Surgeon: Iran Ouch, MD;  Location: [facility] INVASIVE CV LAB;  Service: Cardiovascular;  Laterality: N/A;   COLONOSCOPY WITH PROPOFOL N/A 11/14/2022   Procedure: COLONOSCOPY WITH PROPOFOL;  Surgeon: Wyline Mood, MD;  Location: [facility] ENDOSCOPY;  Service: Gastroenterology;  Laterality: N/A;   TONSILLECTOMY     TONSILLECTOMY       Current Outpatient Medications  Medication Sig Dispense Refill   Accu-Chek Softclix Lancets lancets Use to check glucose 1x/day. Dx code E11.9. 204 each 3   aspirin 81 MG chewable  tablet Chew 1 tablet (81 mg total) by mouth daily.     blood glucose meter kit and supplies KIT Dispense based on patient and insurance preference. Use once daily as directed. (FOR ICD-10 E11.9). 1 each 0   carvedilol (COREG) 3.125 MG tablet TAKE 1 TABLET BY MOUTH 2 TIMES DAILY. 180 tablet 1   Continuous Glucose Sensor (FREESTYLE LIBRE 3 SENSOR) MISC PLACE 1 SENSOR ON THE SKIN EVERY 14 DAYS. USE TO CHECK GLUCOSE CONTINUOUSLY 2 each 2   ezetimibe (ZETIA) 10  MG tablet Take 10 mg by mouth daily.     Insulin Glargine (BASAGLAR KWIKPEN) 100 UNIT/ML INJECT 25 UNITS INTO THE SKIN DAILY. DX CODE E11.65 (Patient taking differently: Inject 27 Units into the skin daily.) 45 mL 1   nitroGLYCERIN (NITROSTAT) 0.4 MG SL tablet Place 1 tablet (0.4 mg total) under the tongue every 5 (five) minutes as needed for chest pain. 25 tablet 0   ONETOUCH ULTRA test strip USE ONCE DAILY AS DIRECTED E11.9 (PT USES ONE TOUCH) 100 strip 1   pantoprazole (PROTONIX) 40 MG tablet Take 1 tablet (40 mg total) by mouth daily for 14 days. Take 30-60 minutes before breakfast 14 tablet 0   rosuvastatin (CRESTOR) 20 MG tablet Take 1 tablet (20 mg total) by mouth daily. 90 tablet 3   sacubitril-valsartan (ENTRESTO) 24-26 MG Take 1 tablet by mouth 2 (two) times daily. 60 tablet 2   tirzepatide (MOUNJARO) 7.5 MG/0.5ML Pen Inject 7.5 mg into the skin once a week. 6 mL 1   No current facility-administered medications for this visit.    Allergies:   Patient has no known allergies.    Social History:  The patient  reports that he quit smoking about 8 years ago. His smoking use included cigarettes. He started smoking about 52 years ago. He has a 22 pack-year smoking history. He has never used smokeless tobacco. He reports current alcohol use. He reports that he does not use drugs.   Family History:  The patient's family history includes Diabetes in his mother; Heart attack in his father, mother, and paternal grandfather.    ROS:  Please see the history of present illness.   Otherwise, review of systems are positive for none.   All other systems are reviewed and negative.    PHYSICAL EXAM: VS:  BP 124/80 (BP Location: Left Arm, Patient Position: Sitting, Cuff Size: Large)   Pulse 95   Ht 5\' 11"  (1.803 m)   Wt 282 lb (127.9 kg)   SpO2 92%   BMI 39.33 kg/m  , BMI Body mass index is 39.33 kg/m. GEN: Well nourished, well developed, in no acute distress  HEENT: normal  Neck: no JVD,  carotid bruits, or masses Cardiac: RRR; no murmurs, rubs, or gallops,no edema  Respiratory:  clear to auscultation bilaterally, normal work of breathing GI: soft, nontender, nondistended, + BS MS: no deformity or atrophy  Skin: warm and dry, no rash Neuro:  Strength and sensation are intact Psych: euthymic mood, full affect   EKG:  EKG is ordered today. EKG showed: Normal sinus rhythm Possible Left atrial enlargement Low voltage QRS Right bundle branch block When compared with ECG of 20-Oct-2015 15:28, Right bundle branch block is now Present Minimal criteria for Anteroseptal infarct are no longer Present     Recent Labs: 07/26/2022: ALT 13 06/20/2023: BUN 21; Creatinine, Ser 0.96; Potassium 4.6; Sodium 133    Lipid Panel    Component Value Date/Time   CHOL 187 07/26/2022 1544  TRIG 209.0 (H) 07/26/2022 1544   HDL 42.20 07/26/2022 1544   CHOLHDL 4 07/26/2022 1544   VLDL 41.8 (H) 07/26/2022 1544   LDLCALC 86 12/26/2019 0818   LDLDIRECT 113.0 07/26/2022 1544      Wt Readings from Last 3 Encounters:  07/10/23 282 lb (127.9 kg)  06/11/23 279 lb 3.2 oz (126.6 kg)  03/12/23 290 lb 9.6 oz (131.8 kg)        ASSESSMENT AND PLAN:  1.  Coronary artery disease involving native coronary arteries without angina: He is doing very well with no anginal symptoms.  Continue aspirin indefinitely.    2. Hyperlipidemia: His LDL improved from 186 to113 with resumption of rosuvastatin.  Most recently, ezetimibe was added.  3. Ischemic cardiomyopathy: Most recent ejection fraction of 40 to 45%.  No evidence of volume overload.  Continue carvedilol and Entresto.   4. Essential hypertension: Blood pressure is well-controlled on current medications.  5.  Obesity: He achieved some weight loss with Mounjaro.  I encouraged him to exercise more.   Disposition:   FU with me in 6 months  Signed,  Lorine Bears, MD  07/10/2023 3:57 PM    [facility]

## 2023-07-10 NOTE — Patient Instructions (Signed)

## 2023-07-13 ENCOUNTER — Telehealth: Payer: Self-pay

## 2023-07-13 DIAGNOSIS — E1165 Type 2 diabetes mellitus with hyperglycemia: Secondary | ICD-10-CM

## 2023-07-13 NOTE — Addendum Note (Signed)
Addended by: [name] on: 07/13/2023 04:24 PM   Modules accepted: Orders

## 2023-07-13 NOTE — Telephone Encounter (Signed)
Patient and wife states they need a referral for a Endocrinologist in [place] and that it does not have to be in [place]. Patient has to have an appointment after 1:00 due to his work schedule.

## 2023-07-13 NOTE — Telephone Encounter (Signed)
Patient is aware of the new referral

## 2023-07-13 NOTE — Telephone Encounter (Signed)
Referral placed. They will contact the patient to set up the appointment and the patient can let them know when they are available.

## 2023-07-13 NOTE — Telephone Encounter (Signed)
Copied from CRM [id number]. Topic: General - Other >> Jul 13, 2023  3:11 PM Chantha C wrote: Reason for CRM: Wants to speak with Lanora Manis reagrding an appt that was made, refuses to give any other info. Pls c/b. Call dropped, tried to c/b, no answer.

## 2023-07-19 ENCOUNTER — Other Ambulatory Visit: Payer: Self-pay | Admitting: Family Medicine

## 2023-07-19 ENCOUNTER — Other Ambulatory Visit: Payer: Self-pay | Admitting: Gastroenterology

## 2023-07-19 ENCOUNTER — Other Ambulatory Visit: Payer: Self-pay | Admitting: Acute Care

## 2023-07-19 DIAGNOSIS — Z122 Encounter for screening for malignant neoplasm of respiratory organs: Secondary | ICD-10-CM

## 2023-07-19 DIAGNOSIS — Z87891 Personal history of nicotine dependence: Secondary | ICD-10-CM

## 2023-07-20 ENCOUNTER — Telehealth: Payer: Medicare HMO

## 2023-07-20 DIAGNOSIS — K219 Gastro-esophageal reflux disease without esophagitis: Secondary | ICD-10-CM

## 2023-07-20 MED ORDER — PANTOPRAZOLE SODIUM 40 MG PO TBEC: 40.0000 mg | DELAYED_RELEASE_TABLET | Freq: Every day | ORAL | 1 refills | Status: DC

## 2023-07-20 NOTE — Telephone Encounter (Signed)
Copied from CRM [id number]. Topic: Clinical - Medication Question >> Jul 20, 2023 10:45 AM Adelina Mings wrote: Reason for CRM: patient wife needs call back in regards to medication refills

## 2023-07-20 NOTE — Addendum Note (Signed)
Addended by: Birdie Sons, Erika Hussar G on: 07/20/2023 11:30 AM   Modules accepted: Orders

## 2023-07-20 NOTE — Telephone Encounter (Signed)
Noted.  Protonix and to pharmacy.  Given his persistent need for this I would suggest referral to GI to consider upper endoscopy to make sure there is nothing else causing his reflux.  I can place that referral once you speak with him.  Thanks.

## 2023-07-20 NOTE — Telephone Encounter (Signed)
Patient's wife and Patient states he needs a refill on the Pantoprazole but that one has an end date of 07/12/23. Patient states that is the only thing that helps him.

## 2023-07-20 NOTE — Telephone Encounter (Signed)
Spoke to Patient's wife who said the Patient is not home right now when he gets in she will discuss with him and let us know if he is agreeable to the GI referral for a upper endoscopy.

## 2023-07-31 NOTE — Telephone Encounter (Signed)
 Called and spoke with wife again she stated pt stated he said we will see wife was given Endocrinology phone number as she stated she has not heard anything from them offered to call husband on cell wife stated he will not answer as he is driving and that she will have him CB to let us  know.

## 2023-07-31 NOTE — Telephone Encounter (Signed)
 Noted. Will wait on patient call back.

## 2023-08-14 ENCOUNTER — Other Ambulatory Visit: Payer: Self-pay | Admitting: Family Medicine

## 2023-08-14 DIAGNOSIS — K219 Gastro-esophageal reflux disease without esophagitis: Secondary | ICD-10-CM

## 2023-08-21 ENCOUNTER — Encounter: Payer: Self-pay | Admitting: Family Medicine

## 2023-08-31 ENCOUNTER — Telehealth: Payer: Self-pay | Admitting: Family Medicine

## 2023-08-31 ENCOUNTER — Telehealth: Payer: Self-pay

## 2023-08-31 NOTE — Telephone Encounter (Signed)
 Spoke with Louanna Rouse, pt's wife on [place]. Had a question about CT scan that they received a letter about. Explained that this was a lung cancer screening. Gave her info about when it was scheduled.

## 2023-08-31 NOTE — Telephone Encounter (Signed)
 Copied from CRM [id number]. Topic: General - Other >> Aug 31, 2023 10:25 AM Allyne Areola wrote: Reason for CRM: Patient's wife is returning a call she received from Lockie Rima, please call back to [phone].

## 2023-08-31 NOTE — Telephone Encounter (Signed)
 Copied from CRM [id number]. Topic: General - Other >> Aug 31, 2023 11:44 AM Freya Jesus wrote: Reason for CRM: Patient wife is calling regarding missed call from Lockie Rima. Requesting a call back.

## 2023-08-31 NOTE — Telephone Encounter (Signed)
 LMTCB

## 2023-08-31 NOTE — Telephone Encounter (Signed)
 Copied from CRM [id number]. Topic: General - Other >> Aug 30, 2023  3:20 PM Mercedes MATSU wrote: Reason for CRM: Patients wife called in stating that she would like to speak to [facility] which is Dr. Deedra nurse. Patients wife refused to disclose the reason the phone is needed, she just said its in regards to a letter they received.

## 2023-09-10 ENCOUNTER — Ambulatory Visit: Payer: Medicare HMO | Admitting: Endocrinology

## 2023-09-11 ENCOUNTER — Ambulatory Visit (INDEPENDENT_AMBULATORY_CARE_PROVIDER_SITE_OTHER): Payer: Medicare HMO | Admitting: Family Medicine

## 2023-09-11 ENCOUNTER — Encounter: Payer: Self-pay | Admitting: Family Medicine

## 2023-09-11 VITALS — BP 110/60 | HR 109 | Temp 98.6°F | Resp 18 | Ht 71.0 in | Wt 279.0 lb

## 2023-09-11 DIAGNOSIS — Z7985 Long-term (current) use of injectable non-insulin antidiabetic drugs: Secondary | ICD-10-CM | POA: Diagnosis not present

## 2023-09-11 DIAGNOSIS — E1159 Type 2 diabetes mellitus with other circulatory complications: Secondary | ICD-10-CM | POA: Diagnosis not present

## 2023-09-11 DIAGNOSIS — Z1329 Encounter for screening for other suspected endocrine disorder: Secondary | ICD-10-CM

## 2023-09-11 DIAGNOSIS — E118 Type 2 diabetes mellitus with unspecified complications: Secondary | ICD-10-CM | POA: Diagnosis not present

## 2023-09-11 DIAGNOSIS — K219 Gastro-esophageal reflux disease without esophagitis: Secondary | ICD-10-CM

## 2023-09-11 DIAGNOSIS — E785 Hyperlipidemia, unspecified: Secondary | ICD-10-CM

## 2023-09-11 DIAGNOSIS — E1169 Type 2 diabetes mellitus with other specified complication: Secondary | ICD-10-CM

## 2023-09-11 DIAGNOSIS — E538 Deficiency of other specified B group vitamins: Secondary | ICD-10-CM | POA: Diagnosis not present

## 2023-09-11 DIAGNOSIS — E559 Vitamin D deficiency, unspecified: Secondary | ICD-10-CM

## 2023-09-11 DIAGNOSIS — I152 Hypertension secondary to endocrine disorders: Secondary | ICD-10-CM | POA: Diagnosis not present

## 2023-09-11 DIAGNOSIS — I5020 Unspecified systolic (congestive) heart failure: Secondary | ICD-10-CM | POA: Diagnosis not present

## 2023-09-11 DIAGNOSIS — E1165 Type 2 diabetes mellitus with hyperglycemia: Secondary | ICD-10-CM

## 2023-09-11 MED ORDER — ULTRA FLO INSULIN PEN NEEDLES 33G X 4 MM MISC: 3 refills | Status: AC

## 2023-09-11 MED ORDER — TIRZEPATIDE 10 MG/0.5ML ~~LOC~~ SOAJ: 10.0000 mg | SUBCUTANEOUS | 1 refills | Status: DC

## 2023-09-11 MED ORDER — BASAGLAR KWIKPEN 100 UNIT/ML ~~LOC~~ SOPN: 41.0000 [IU] | PEN_INJECTOR | Freq: Every day | SUBCUTANEOUS | Status: DC

## 2023-09-11 NOTE — Progress Notes (Unsigned)
 SUBJECTIVE:   Chief Complaint  Patient presents with   Establish Care    Transferring from Dr. Birdie Sons   HPI Presents to clinic to transfer care Discussed the use of AI scribe software for clinical note transcription with the patient, who gave verbal consent to proceed.  History of Present Illness Michael Jordan is a 68 year old male with diabetes and congestive heart failure who presents for medication management and follow-up. He is accompanied by his wife, who is also a Engineer, civil (consulting). He was referred by Dr. Birdie Sons for continued care after his retirement.  He has diabetes and is currently on insulin therapy. His blood sugar levels remain high, typically between 200 and 300 mg/dL, sometimes exceeding 409 mg/dL. He has been on [facility] for about a year and a half, currently at a dose of 7.5 mg once a week. He was previously on Ozempic but switched due to availability issues. His insulin dosage is currently at 41 units, increased from 25 units due to persistently high blood sugar levels. His lowest recorded blood sugar was 199 mg/dL. His A1c was last recorded at 13.5% in November, down from a previous 14%. He experiences decreased appetite and has lost weight, previously weighing over 300 pounds.  He has a history of congestive heart failure and underwent a cardiac catheterization in 2016, during which three stents were placed. He is currently on carvedilol 3.125 mg, which was reduced from a higher dose due to side effects. He reports a consistently elevated heart rate, around 109 bpm, despite being on a beta-blocker. He is also on Entresto, taking one tablet twice a day, and nitroglycerin, which he has never used.  He is on several other medications including Zetia, Crestor 20 mg, and Protonix as needed. Protonix has been effective for his stomach issues, although he takes it only when necessary. He has a history of adverse effects from metformin, which affected his balance and was  discontinued.  He is a former smoker, having quit many years ago. He is active, working two jobs and maintaining two houses, and is planning to start a garden soon. He is scheduled for a CT of the chest next week, which was approved by insurance as a screening for lung cancer. No current chest pain or shortness of breath.      PERTINENT PMH / PSH: As above  OBJECTIVE:  BP 110/60   Pulse (!) 109   Temp 98.6 F (37 C)   Resp 18   Ht 5\' 11"  (1.803 m)   Wt 279 lb (126.6 kg)   SpO2 94%   BMI 38.91 kg/m    Physical Exam Vitals reviewed.  Constitutional:      Appearance: He is obese.  HENT:     Head: Normocephalic.     Right Ear: Tympanic membrane, ear canal and external ear normal.     Left Ear: Tympanic membrane, ear canal and external ear normal.     Nose: Nose normal.     Mouth/Throat:     Mouth: Mucous membranes are moist.  Eyes:     Conjunctiva/sclera: Conjunctivae normal.     Pupils: Pupils are equal, round, and reactive to light.  Neck:     Thyroid: No thyromegaly or thyroid tenderness.     Vascular: No carotid bruit.  Cardiovascular:     Rate and Rhythm: Normal rate and regular rhythm.     Pulses: Normal pulses.     Heart sounds: Normal heart sounds.  Pulmonary:  Effort: Pulmonary effort is normal.     Breath sounds: Normal breath sounds.  Abdominal:     General: Abdomen is flat. Bowel sounds are normal.     Palpations: Abdomen is soft.  Musculoskeletal:        General: Normal range of motion.     Cervical back: Normal range of motion and neck supple.     Right lower leg: No edema.     Left lower leg: No edema.  Lymphadenopathy:     Cervical: No cervical adenopathy.  Neurological:     Mental Status: He is alert.  Psychiatric:        Mood and Affect: Mood normal.        Behavior: Behavior normal.        Thought Content: Thought content normal.        Judgment: Judgment normal.           09/11/2023    2:04 PM 06/11/2023    1:15 PM 03/12/2023     1:15 PM 10/30/2022    2:21 PM 07/26/2022    3:42 PM  Depression screen PHQ 2/9  Decreased Interest 0 0 0 0 0  Down, Depressed, Hopeless 0 0 0 0 0  PHQ - 2 Score 0 0 0 0 0  Altered sleeping 0 0 0 0   Tired, decreased energy 0 1 0 0   Change in appetite 0 2 0 0   Feeling bad or failure about yourself  0 0 0 0   Trouble concentrating 0 0 0 0   Moving slowly or fidgety/restless 0 0 0 0   Suicidal thoughts 0 0 0 0   PHQ-9 Score 0 3 0 0   Difficult doing work/chores Not difficult at all Not difficult at all Not difficult at all Not difficult at all       09/11/2023    2:04 PM 06/11/2023    1:15 PM 03/12/2023    1:15 PM 10/30/2022    2:21 PM  GAD 7 : Generalized Anxiety Score  Nervous, Anxious, on Edge 0 0 0 0  Control/stop worrying 0 0 0 0  Worry too much - different things 0 0 0 0  Trouble relaxing 0 0 0 0  Restless 0 0 0 0  Easily annoyed or irritable 0 0 0 0  Afraid - awful might happen 0 0 0 0  Total GAD 7 Score 0 0 0 0  Anxiety Difficulty Not difficult at all Not difficult at all Not difficult at all Not difficult at all    ASSESSMENT/PLAN:  Type 2 diabetes mellitus with complications (HCC) Assessment & Plan: Poorly controlled with blood glucose levels between 200-400, occasionally over 500. Currently on Basaglar 41 units daily and Mounjaro 7.5mg  weekly. Discussed the need for better control and the risks of hypoglycemia with insulin use. Considered increasing Mounjaro and adding other oral agents like SGLT2 -Increase Mounjaro to 10mg  weekly. -Refill Lantus 41 units daily -Check A1C on 09/17/2023. -Discuss further management with endocrinologist on 09/17/2023.  Orders: -     Therapist, nutritional; Inject 41 Units into the skin daily. -     Tirzepatide; Inject 10 mg into the skin once a week.  Dispense: 6 mL; Refill: 1 -     Hemoglobin A1c; Future -     CBC with Differential/Platelet; Future -     Magnesium; Future -     Microalbumin / creatinine urine ratio; Future -     Ultra Flo  Insulin  Pen Needles; Use daily with Kwikpen  Dispense: 100 each; Refill: 3  Hypertension associated with diabetes (HCC) -     Comprehensive metabolic panel; Future  Hyperlipidemia associated with type 2 diabetes mellitus (HCC) Assessment & Plan: On Crestor and Zetia. -Continue Crestor 20 mg daily -Continue Zetia 10 mg daily -Check fasting lipids  Orders: -     Lipid panel; Future  Morbid obesity (HCC) Assessment & Plan: Currently on Mounjaro  Increase doe to 10 mg weekly  Orders: -     TSH; Future  HFrEF (heart failure with reduced ejection fraction) (HCC) Assessment & Plan: On Carvedilol and Entresto. No current symptoms of shortness of breath or chest pain. Euvolemic on exam. -Continue to follow up with Cardiology for management -CT of the chest scheduled for 09/18/2023.   Vitamin D deficiency Assessment & Plan: Check Vitamin D level  Orders: -     VITAMIN D 25 Hydroxy (Vit-D Deficiency, Fractures); Future  Vitamin B 12 deficiency Assessment & Plan: Check Vitamin B 12 level  Orders: -     Vitamin B12; Future  Thyroid disorder screening -     TSH; Future  Gastroesophageal reflux disease, unspecified whether esophagitis present Assessment & Plan: -Continue Protonix as needed.    PDMP reviewed  Return for PCP.  Dana Allan, MD

## 2023-09-11 NOTE — Patient Instructions (Addendum)
It was a pleasure meeting you today. Thank you for allowing me to take part in your health care.  Our goals for today as we discussed include:  Increase Mounjaro to 10 mg weekly   Schedule lab appointment. Fast for 10 hours  Follow up with Endocrinology as scheduled  Follow up with Cardiology as scheduled  This is a list of the screening recommended for you and due dates:  Health Maintenance  Topic Date Due   Medicare Annual Wellness Visit  Never done   Pneumonia Vaccine (1 of 2 - PCV) Never done   Zoster (Shingles) Vaccine (1 of 2) Never done   Screening for Lung Cancer  09/14/2023   Flu Shot  10/22/2023*   Complete foot exam   10/30/2023   Eye exam for diabetics  12/07/2023   Hemoglobin A1C  12/09/2023   Yearly kidney health urinalysis for diabetes  06/10/2024   Yearly kidney function blood test for diabetes  06/19/2024   Colon Cancer Screening  11/13/2025   DTaP/Tdap/Td vaccine (3 - Td or Tdap) 12/10/2029   Hepatitis C Screening  Completed   HPV Vaccine  Aged Out   COVID-19 Vaccine  Discontinued  *Topic was postponed. The date shown is not the original due date.      If you have any questions or concerns, please do not hesitate to call the office at [phone].  I look forward to our next visit and until then take care and stay safe.  Regards,   Dana Allan, MD   [facility]

## 2023-09-13 ENCOUNTER — Other Ambulatory Visit: Payer: Self-pay | Admitting: Adult Health

## 2023-09-13 MED ORDER — NYSTATIN 100000 UNIT/GM EX POWD: 1.0000 | Freq: Three times a day (TID) | CUTANEOUS | 0 refills | Status: DC

## 2023-09-13 MED ORDER — FLUCONAZOLE 150 MG PO TABS: ORAL_TABLET | ORAL | 0 refills | Status: DC

## 2023-09-14 ENCOUNTER — Other Ambulatory Visit (INDEPENDENT_AMBULATORY_CARE_PROVIDER_SITE_OTHER): Payer: Medicare HMO

## 2023-09-14 DIAGNOSIS — E1165 Type 2 diabetes mellitus with hyperglycemia: Secondary | ICD-10-CM

## 2023-09-14 DIAGNOSIS — I152 Hypertension secondary to endocrine disorders: Secondary | ICD-10-CM

## 2023-09-14 DIAGNOSIS — E538 Deficiency of other specified B group vitamins: Secondary | ICD-10-CM

## 2023-09-14 DIAGNOSIS — E785 Hyperlipidemia, unspecified: Secondary | ICD-10-CM | POA: Diagnosis not present

## 2023-09-14 DIAGNOSIS — E559 Vitamin D deficiency, unspecified: Secondary | ICD-10-CM

## 2023-09-14 DIAGNOSIS — E1159 Type 2 diabetes mellitus with other circulatory complications: Secondary | ICD-10-CM

## 2023-09-14 DIAGNOSIS — Z1329 Encounter for screening for other suspected endocrine disorder: Secondary | ICD-10-CM

## 2023-09-14 LAB — CBC WITH DIFFERENTIAL/PLATELET
Basophils Absolute: 0.1 10*3/uL (ref 0.0–0.1)
Basophils Relative: 1 % (ref 0.0–3.0)
Eosinophils Absolute: 0.2 10*3/uL (ref 0.0–0.7)
Eosinophils Relative: 1.8 % (ref 0.0–5.0)
HCT: 48.3 % (ref 39.0–52.0)
Hemoglobin: 15.9 g/dL (ref 13.0–17.0)
Lymphocytes Relative: 16.2 % (ref 12.0–46.0)
Lymphs Abs: 1.9 10*3/uL (ref 0.7–4.0)
MCHC: 32.9 g/dL (ref 30.0–36.0)
MCV: 87.1 fL (ref 78.0–100.0)
Monocytes Absolute: 0.9 10*3/uL (ref 0.1–1.0)
Monocytes Relative: 7.6 % (ref 3.0–12.0)
Neutro Abs: 8.5 10*3/uL — ABNORMAL HIGH (ref 1.4–7.7)
Neutrophils Relative %: 73.4 % (ref 43.0–77.0)
Platelets: 301 10*3/uL (ref 150.0–400.0)
RBC: 5.55 Mil/uL (ref 4.22–5.81)
RDW: 14.1 % (ref 11.5–15.5)
WBC: 11.6 10*3/uL — ABNORMAL HIGH (ref 4.0–10.5)

## 2023-09-14 LAB — COMPREHENSIVE METABOLIC PANEL
ALT: 17 U/L (ref 0–53)
AST: 20 U/L (ref 0–37)
Albumin: 3.9 g/dL (ref 3.5–5.2)
Alkaline Phosphatase: 109 U/L (ref 39–117)
BUN: 14 mg/dL (ref 6–23)
CO2: 27 meq/L (ref 19–32)
Calcium: 8.6 mg/dL (ref 8.4–10.5)
Chloride: 97 meq/L (ref 96–112)
Creatinine, Ser: 0.91 mg/dL (ref 0.40–1.50)
GFR: 86.41 mL/min (ref >60.00)
Glucose, Bld: 299 mg/dL — ABNORMAL HIGH (ref 70–99)
Potassium: 4.5 meq/L (ref 3.5–5.1)
Sodium: 135 meq/L (ref 135–145)
Total Bilirubin: 0.8 mg/dL (ref 0.2–1.2)
Total Protein: 6.7 g/dL (ref 6.0–8.3)

## 2023-09-14 LAB — LIPID PANEL
Cholesterol: 145 mg/dL (ref 0–200)
HDL: 46.6 mg/dL (ref >39.00)
LDL Cholesterol: 69 mg/dL (ref 0–99)
NonHDL: 98.72
Total CHOL/HDL Ratio: 3
Triglycerides: 151 mg/dL — ABNORMAL HIGH (ref 0.0–149.0)
VLDL: 30.2 mg/dL (ref 0.0–40.0)

## 2023-09-14 LAB — MICROALBUMIN / CREATININE URINE RATIO
Creatinine,U: 139.8 mg/dL
Microalb Creat Ratio: 41.3 mg/g — ABNORMAL HIGH (ref 0.0–30.0)
Microalb, Ur: 5.8 mg/dL — ABNORMAL HIGH (ref 0.0–1.9)

## 2023-09-14 LAB — VITAMIN D 25 HYDROXY (VIT D DEFICIENCY, FRACTURES): VITD: 22.19 ng/mL — ABNORMAL LOW (ref 30.00–100.00)

## 2023-09-14 LAB — HEMOGLOBIN A1C: Hgb A1c MFr Bld: 15.3 % — ABNORMAL HIGH (ref 4.6–6.5)

## 2023-09-14 LAB — TSH: TSH: 1.02 u[IU]/mL (ref 0.35–5.50)

## 2023-09-14 LAB — MAGNESIUM: Magnesium: 2 mg/dL (ref 1.5–2.5)

## 2023-09-14 LAB — VITAMIN B12: Vitamin B-12: 374 pg/mL (ref 211–911)

## 2023-09-17 DIAGNOSIS — Z794 Long term (current) use of insulin: Secondary | ICD-10-CM | POA: Diagnosis not present

## 2023-09-17 DIAGNOSIS — R809 Proteinuria, unspecified: Secondary | ICD-10-CM | POA: Diagnosis not present

## 2023-09-17 DIAGNOSIS — E1165 Type 2 diabetes mellitus with hyperglycemia: Secondary | ICD-10-CM | POA: Insufficient documentation

## 2023-09-17 DIAGNOSIS — E66813 Obesity, class 3: Secondary | ICD-10-CM | POA: Insufficient documentation

## 2023-09-18 ENCOUNTER — Ambulatory Visit
Admission: RE | Admit: 2023-09-18 | Discharge: 2023-09-18 | Disposition: A | Discharge status: Home / Self Care | Payer: Medicare HMO | Source: Ambulatory Visit | Attending: Family Medicine | Admitting: Family Medicine

## 2023-09-18 ENCOUNTER — Encounter: Payer: Self-pay | Admitting: Family Medicine

## 2023-09-18 DIAGNOSIS — Z122 Encounter for screening for malignant neoplasm of respiratory organs: Secondary | ICD-10-CM | POA: Diagnosis not present

## 2023-09-18 DIAGNOSIS — Z1329 Encounter for screening for other suspected endocrine disorder: Secondary | ICD-10-CM | POA: Insufficient documentation

## 2023-09-18 DIAGNOSIS — I5020 Unspecified systolic (congestive) heart failure: Secondary | ICD-10-CM | POA: Insufficient documentation

## 2023-09-18 DIAGNOSIS — Z125 Encounter for screening for malignant neoplasm of prostate: Secondary | ICD-10-CM | POA: Insufficient documentation

## 2023-09-18 DIAGNOSIS — Z87891 Personal history of nicotine dependence: Secondary | ICD-10-CM

## 2023-09-18 DIAGNOSIS — E538 Deficiency of other specified B group vitamins: Secondary | ICD-10-CM | POA: Insufficient documentation

## 2023-09-18 DIAGNOSIS — E559 Vitamin D deficiency, unspecified: Secondary | ICD-10-CM | POA: Insufficient documentation

## 2023-09-18 MED ORDER — BASAGLAR KWIKPEN 100 UNIT/ML ~~LOC~~ SOPN: 50.0000 [IU] | PEN_INJECTOR | Freq: Every day | SUBCUTANEOUS | Status: DC

## 2023-09-18 MED ORDER — VITAMIN D (ERGOCALCIFEROL) 1.25 MG (50000 UNIT) PO CAPS: 50000.0000 [IU] | ORAL_CAPSULE | ORAL | 1 refills | Status: DC

## 2023-09-18 NOTE — Assessment & Plan Note (Signed)
 On Carvedilol and Entresto. No current symptoms of shortness of breath or chest pain. Euvolemic on exam. -Continue to follow up with Cardiology for management -CT of the chest scheduled for 09/18/2023.

## 2023-09-18 NOTE — Assessment & Plan Note (Signed)
Continue Protonix as needed.

## 2023-09-18 NOTE — Assessment & Plan Note (Signed)
 Check Vitamin D level

## 2023-09-18 NOTE — Assessment & Plan Note (Signed)
 On Crestor and Zetia. -Continue Crestor 20 mg daily -Continue Zetia 10 mg daily -Check fasting lipids

## 2023-09-18 NOTE — Assessment & Plan Note (Signed)
 Currently on Mounjaro  Increase doe to 10 mg weekly

## 2023-09-18 NOTE — Assessment & Plan Note (Signed)
 Check Vitamin B 12 level

## 2023-09-18 NOTE — Assessment & Plan Note (Signed)
 Poorly controlled with blood glucose levels between 200-400, occasionally over 500. Currently on Basaglar 41 units daily and Mounjaro 7.5mg  weekly. Discussed the need for better control and the risks of hypoglycemia with insulin use. Considered increasing Mounjaro and adding other oral agents like SGLT2 -Increase Mounjaro to 10mg  weekly. -Refill Lantus 41 units daily -Check A1C on 09/17/2023. -Discuss further management with endocrinologist on 09/17/2023.

## 2023-09-20 ENCOUNTER — Ambulatory Visit: Payer: Self-pay | Admitting: Family Medicine

## 2023-09-20 NOTE — Telephone Encounter (Addendum)
 This RN made second attempt to triage patient. No answer, unable to leave a message. Will route for continued attempts.   This RN made first attempt to triage patient. No answer, unable to leave a message. Will route for continued attempts.   Copied from CRM [id number]. Topic: Clinical - Medication Question >> Sep 20, 2023  3:10 PM Alvino Blood C wrote: Reason for CRM: Patient is wanting to confirm if he should be taking Vitamin B12, because the pharmacy gave him Vitamin D, Ergocalciferol, (DRISDOL) 1.25 MG (50000 UNIT) CAPS capsule. Patient would like to know if its ok to take what he rec'd and would like a call back at [phone] (H)

## 2023-09-20 NOTE — Telephone Encounter (Signed)
 This RN made 3rd attempt to contact pt."Call cannot be completed as dialed. Routing to clinic.

## 2023-09-21 NOTE — Telephone Encounter (Signed)
 Called and spoke to his wife and let her know that pt is to take 1 pill of vitamin D once a week and the Endo suggest him take B-12 OTC as well.

## 2023-10-15 ENCOUNTER — Other Ambulatory Visit: Payer: Self-pay

## 2023-10-15 DIAGNOSIS — Z87891 Personal history of nicotine dependence: Secondary | ICD-10-CM

## 2023-10-15 DIAGNOSIS — Z122 Encounter for screening for malignant neoplasm of respiratory organs: Secondary | ICD-10-CM

## 2023-11-05 ENCOUNTER — Encounter: Payer: Self-pay | Admitting: Family Medicine

## 2023-11-05 ENCOUNTER — Ambulatory Visit (INDEPENDENT_AMBULATORY_CARE_PROVIDER_SITE_OTHER): Payer: Medicare HMO | Admitting: Family Medicine

## 2023-11-05 VITALS — BP 138/72 | HR 101 | Temp 98.4°F | Ht 71.0 in | Wt 286.1 lb

## 2023-11-05 DIAGNOSIS — I10 Essential (primary) hypertension: Secondary | ICD-10-CM | POA: Diagnosis not present

## 2023-11-05 DIAGNOSIS — E118 Type 2 diabetes mellitus with unspecified complications: Secondary | ICD-10-CM

## 2023-11-05 DIAGNOSIS — Z6839 Body mass index (BMI) 39.0-39.9, adult: Secondary | ICD-10-CM

## 2023-11-05 DIAGNOSIS — K219 Gastro-esophageal reflux disease without esophagitis: Secondary | ICD-10-CM

## 2023-11-05 DIAGNOSIS — E1165 Type 2 diabetes mellitus with hyperglycemia: Secondary | ICD-10-CM | POA: Diagnosis not present

## 2023-11-05 DIAGNOSIS — Z7984 Long term (current) use of oral hypoglycemic drugs: Secondary | ICD-10-CM

## 2023-11-05 DIAGNOSIS — Z794 Long term (current) use of insulin: Secondary | ICD-10-CM | POA: Diagnosis not present

## 2023-11-05 DIAGNOSIS — I5020 Unspecified systolic (congestive) heart failure: Secondary | ICD-10-CM

## 2023-11-05 DIAGNOSIS — E66813 Obesity, class 3: Secondary | ICD-10-CM | POA: Diagnosis not present

## 2023-11-05 MED ORDER — TIRZEPATIDE 12.5 MG/0.5ML ~~LOC~~ SOAJ: 12.5000 mg | SUBCUTANEOUS | 0 refills | Status: DC

## 2023-11-05 MED ORDER — EMPAGLIFLOZIN 10 MG PO TABS: 10.0000 mg | ORAL_TABLET | Freq: Every day | ORAL | Status: DC

## 2023-11-05 NOTE — Progress Notes (Signed)
 Medication Samples have been provided to the patient.  Drug name: Jaediance  Strength: 10 mg     Qty: 2 boxes  LOT: [id number]    Exp.Date: [id number]  Dosing instructions: Take 1 tablet by mouth daily   The patient has been instructed regarding the correct time, dose, and frequency of taking this medication, including desired effects and most common side effects.   Loetta Ringer  Zhoe Catania 4:46 PM 11/05/2023

## 2023-11-05 NOTE — Patient Instructions (Addendum)
 It was a pleasure meeting you today. Thank you for allowing me to take part in your health care.  Our goals for today as we discussed include:  Increase Mounjaro to 12.5 mg weekly. Stop Mounjaro 10 mg weekly  Start Jardiance 10 mg daily.  Sample provided for 2 weeks Have sent referral to pharmacy for medication assistance if possible.  Continue insulin 50 units in the evening  Follow up in 4 weeks   This is a list of the screening recommended for you and due dates:  Health Maintenance  Topic Date Due   Medicare Annual Wellness Visit  Never done   Pneumonia Vaccine (1 of 2 - PCV) Never done   Zoster (Shingles) Vaccine (1 of 2) Never done   Complete foot exam   10/30/2023   Eye exam for diabetics  12/07/2023   Flu Shot  02/22/2024   Hemoglobin A1C  03/13/2024   Yearly kidney function blood test for diabetes  09/13/2024   Yearly kidney health urinalysis for diabetes  09/13/2024   Screening for Lung Cancer  09/17/2024   Colon Cancer Screening  11/13/2025   DTaP/Tdap/Td vaccine (3 - Td or Tdap) 12/10/2029   Hepatitis C Screening  Completed   HPV Vaccine  Aged Out   Meningitis B Vaccine  Aged Out   COVID-19 Vaccine  Discontinued     If you have any questions or concerns, please do not hesitate to call the office at [phone].  I look forward to our next visit and until then take care and stay safe.  Regards,   Valli Gaw, MD   [facility]

## 2023-11-05 NOTE — Progress Notes (Signed)
 SUBJECTIVE:   Chief Complaint  Patient presents with   Medical Management of Chronic Issues    Follow up    HPI Presents for follow up chronic disease management  Discussed the use of AI scribe software for clinical note transcription with the patient, who gave verbal consent to proceed.  History of Present Illness Michael Jordan is a 68 year old male with diabetes who presents for a follow-up visit regarding blood sugar management.  His last A1c was 15.3, indicating poor blood sugar control. He is currently on insulin , which was increased from 41 mg to 50 mg, and Mounjaro , which was increased to 10 mg four weeks ago. Despite these adjustments, his blood sugars remain high, with a reading of 274 this morning.  He feels unwell from late Sunday evening to early Monday morning until he takes Mounjaro  on Wednesday. By Thursday morning, he experiences a lack of appetite and stomach discomfort, stating, 'I'm just not hungry,' and 'if I eat something, my stomach rolls.' He has been prescribed pantoprazole  for stomach issues but only takes it as needed.  His wife notes that he becomes irritable and eats very little, often only a couple of bites before pushing his plate away. He drinks a lot of milk, which may be contributing to his high blood sugar levels. He reports drinking about a quart of milk a day, which he believes is keeping him alive, although it may be affecting his blood sugar.  He has a history of needing frequent bathroom breaks at work due to his medication, and he has a form from [employer] allowing him to use the bathroom as needed. He mentions that sometimes he has to come home to change clothes because he is not allowed to go to the bathroom in time.  He is currently taking pantoprazole  for stomach protection, but he only uses it when necessary. He also mentions that he has been prescribed Jardiance , but the cost is a concern, and he is unsure if his insurance will cover it.     PERTINENT PMH / PSH: As above  OBJECTIVE:  BP 138/72   Pulse (!) 101   Temp 98.4 F (36.9 C)   Ht 5\' 11"  (1.803 m)   Wt 286 lb 2 oz (129.8 kg)   SpO2 95%   BMI 39.91 kg/m    Physical Exam Vitals reviewed.  Constitutional:      General: He is not in acute distress.    Appearance: Normal appearance. He is obese. He is not ill-appearing, toxic-appearing or diaphoretic.  Eyes:     General:        Right eye: No discharge.        Left eye: No discharge.  Cardiovascular:     Rate and Rhythm: Normal rate and regular rhythm.     Heart sounds: Normal heart sounds.  Pulmonary:     Effort: Pulmonary effort is normal.     Breath sounds: Normal breath sounds.  Abdominal:     General: Bowel sounds are normal.  Musculoskeletal:        General: Normal range of motion.     Cervical back: Normal range of motion.  Skin:    General: Skin is warm and dry.  Neurological:     Mental Status: He is alert and oriented to person, place, and time. Mental status is at baseline.  Psychiatric:        Mood and Affect: Mood normal.  Behavior: Behavior normal.        Thought Content: Thought content normal.        Judgment: Judgment normal.           11/05/2023    4:11 PM 09/11/2023    2:04 PM 06/11/2023    1:15 PM 03/12/2023    1:15 PM 10/30/2022    2:21 PM  Depression screen PHQ 2/9  Decreased Interest 0 0 0 0 0  Down, Depressed, Hopeless 0 0 0 0 0  PHQ - 2 Score 0 0 0 0 0  Altered sleeping 0 0 0 0 0  Tired, decreased energy 0 0 1 0 0  Change in appetite 0 0 2 0 0  Feeling bad or failure about yourself  0 0 0 0 0  Trouble concentrating 0 0 0 0 0  Moving slowly or fidgety/restless 0 0 0 0 0  Suicidal thoughts 0 0 0 0 0  PHQ-9 Score 0 0 3 0 0  Difficult doing work/chores Not difficult at all Not difficult at all Not difficult at all Not difficult at all Not difficult at all      11/05/2023    4:11 PM 09/11/2023    2:04 PM 06/11/2023    1:15 PM 03/12/2023    1:15 PM  GAD 7 :  Generalized Anxiety Score  Nervous, Anxious, on Edge 0 0 0 0  Control/stop worrying 0 0 0 0  Worry too much - different things 0 0 0 0  Trouble relaxing 0 0 0 0  Restless 0 0 0 0  Easily annoyed or irritable 0 0 0 0  Afraid - awful might happen 0 0 0 0  Total GAD 7 Score 0 0 0 0  Anxiety Difficulty Not difficult at all Not difficult at all Not difficult at all Not difficult at all    ASSESSMENT/PLAN:  Type 2 diabetes mellitus with complications (HCC) Assessment & Plan: Persistent hyperglycemia with A1c of 15.3%. Current regimen includes insulin  and Mounjaro . Blood glucose remains high. Consideration of adding mealtime insulin  and checking for hypercortisolism. Discussed Jardiance  as adjunct therapy. Shared decision-making regarding medication adjustments and financial considerations. -Continue Basaglar  50 units daily - Increase Mounjaro  to 12.5 mg weekly  - Consider adding mealtime insulin  (Lispro) with meals. - Refer to pharmacy for medication management and financial assistance. - Start Jardiance  as an adjunct therapy, monitor for urinary tract infections. Sample provided - Educate on dietary modifications to reduce carbohydrate intake   Orders: -     Empagliflozin ; Take 1 tablet (10 mg total) by mouth daily before breakfast.  Dispense: 14 tablet -     Tirzepatide ; Inject 12.5 mg into the skin once a week.  Dispense: 3 mL; Refill: 0 -     AMB Referral [facility] Care Management  Gastroesophageal reflux disease, unspecified whether esophagitis present Assessment & Plan: -Continue Protonix  as needed.   HFrEF (heart failure with reduced ejection fraction) (HCC) Assessment & Plan: On Carvedilol  and Entresto . No current symptoms of shortness of breath or chest pain. Euvolemic on exam. -Continue to follow up with Cardiology for management    Morbid obesity [facility]) Assessment & Plan: Currently on Mounjaro .  Weight increased despite medication Increase dose to 12.5 mg weekly Consider am  cortisol with next labs     PDMP reviewed  Return for PCP, DM.  Valli Gaw, MD

## 2023-11-14 ENCOUNTER — Encounter: Payer: Self-pay | Admitting: Family Medicine

## 2023-11-14 NOTE — Assessment & Plan Note (Signed)
 Persistent hyperglycemia with A1c of 15.3%. Current regimen includes insulin  and Mounjaro . Blood glucose remains high. Consideration of adding mealtime insulin  and checking for hypercortisolism. Discussed Jardiance  as adjunct therapy. Shared decision-making regarding medication adjustments and financial considerations. -Continue Basaglar  50 units daily - Increase Mounjaro  to 12.5 mg weekly  - Consider adding mealtime insulin  (Lispro) with meals. - Refer to pharmacy for medication management and financial assistance. - Start Jardiance  as an adjunct therapy, monitor for urinary tract infections. Sample provided - Educate on dietary modifications to reduce carbohydrate intake

## 2023-11-14 NOTE — Assessment & Plan Note (Signed)
Continue Protonix as needed.

## 2023-11-14 NOTE — Assessment & Plan Note (Signed)
 Currently on Mounjaro .  Weight increased despite medication Increase dose to 12.5 mg weekly Consider am cortisol with next labs

## 2023-11-14 NOTE — Assessment & Plan Note (Signed)
 On Carvedilol  and Entresto . No current symptoms of shortness of breath or chest pain. Euvolemic on exam. -Continue to follow up with Cardiology for management

## 2023-11-21 ENCOUNTER — Telehealth: Payer: Self-pay | Admitting: *Deleted

## 2023-11-21 NOTE — Progress Notes (Signed)
 Care Guide Pharmacy Note  11/21/2023 Name: Michael Jordan MRN: [id number] DOB: 18-Feb-1956  Referred By: Valli Gaw, MD Reason for referral: Complex Care Management and Call Attempt #1 (Outreach to schedule referral with pharmacist )   Michael Jordan is a 68 y.o. year old male who is a primary care patient of Valli Gaw, MD.  Michael Jordan was referred to the pharmacist for assistance related to: DMII  An unsuccessful telephone outreach was attempted today to contact the patient who was referred to the pharmacy team for assistance with medication assistance. Additional attempts will be made to contact the patient.  Kandis Ormond, CMA [place]  [facility], [facility] Guide Direct Dial: [phone]  Fax: [phone] Website: [email or web address]

## 2023-11-23 NOTE — Progress Notes (Signed)
 Care Guide Pharmacy Note  11/23/2023 Name: Michael Jordan MRN: [id number] DOB: 1956/02/01  Referred By: Valli Gaw, MD Reason for referral: Complex Care Management and Call Attempt #1 (Outreach to schedule referral with pharmacist )   Michael Jordan is a 68 y.o. year old male who is a primary care patient of Valli Gaw, MD.  Michael Jordan was referred to the pharmacist for assistance related to: DMII  Successful contact was made with the patient to discuss pharmacy services including being ready for the pharmacist to call at least 5 minutes before the scheduled appointment time and to have medication bottles and any blood pressure readings ready for review. The patient agreed to meet with the pharmacist via telephone visit on 11/27/2023  Kandis Ormond, CMA [place]  [facility], [facility] Guide Direct Dial: [phone]  Fax: [phone] Website: [email or web address]

## 2023-11-27 ENCOUNTER — Other Ambulatory Visit: Payer: Self-pay | Admitting: Family Medicine

## 2023-11-27 ENCOUNTER — Other Ambulatory Visit (INDEPENDENT_AMBULATORY_CARE_PROVIDER_SITE_OTHER): Admitting: Pharmacist

## 2023-11-27 DIAGNOSIS — Z794 Long term (current) use of insulin: Secondary | ICD-10-CM

## 2023-11-27 DIAGNOSIS — E1165 Type 2 diabetes mellitus with hyperglycemia: Secondary | ICD-10-CM

## 2023-11-27 MED ORDER — TOUJEO MAX SOLOSTAR 300 UNIT/ML ~~LOC~~ SOPN: PEN_INJECTOR | SUBCUTANEOUS | 0 refills | Status: DC

## 2023-11-27 NOTE — Patient Instructions (Addendum)
 Mr. Michael Jordan,   It was a pleasure to speak with you today! As we discussed:?   We discussed switching your basaglar  insulin  to an insulin  that is more concentrated. This helps to improve the absorption and the efficacy of injected insulin . When your insulin  doses are higher, your body can have a harder time absorbing the larger volume. The concentrated insulin  has more insulin  in the same amount of volume and allows the insulin  to be absorbed more consistently.   Stop Basaglar  insulin  50 units. Start Toujeo Max U300 insulin  at 55 units under the skin once daily. [employer] reported this insulin  is covered. A prescription has been sent to your pharmacy.   Continue Mounjaro  12.5 mg weekly Continue Jardiance  10 mg daily    We discussed a goal of trying a mixture of sugar and Stevia in your sweet tea as to reduce the amount of sugar you are consuming through the day. One quart of sweet tea has a quarter cup of sugar in it. Even if consumed throughout the day, this is likely contributing a good bit to your sugars remaining elevated.  Try making the gallon with half the amount of sugar then add Stevia until the taste is sweet enough for you.    Apply your [place] sensor this weekend. See information below on getting started.    Please reach out prior to your next scheduled appointment should you have any questions or concerns.  Thank you!   Future Appointments  Date Time Provider Department Center  11/30/2023 11:20 AM Valli Gaw, MD [facility]  12/04/2023  1:30 PM [facility] PHARMACIST [facility]  01/09/2024  1:30 PM Florette Hurry, NP [facility] None  06/18/2024  1:00 PM Bair, Randa Burton, MD [facility]    Daron Ellen, PharmD Clinical Pharmacist [facility] [phone]

## 2023-11-27 NOTE — Progress Notes (Signed)
 11/28/2023 Name: Michael Jordan MRN: [id number] DOB: June 25, 1956  Subjective  Chief Complaint  Patient presents with   Diabetes    Reason for visit: ?  Michael Jordan is a 68 y.o. male with a history of diabetes (type 2), who presents today for an initial diabetes pharmacotherapy visit.? Pertinent PMH also includes CAD (STEMI s/p DES), HTN, HFrEF, HLD, neuropathy, obesity.  Known DM Complications: peripheral neuropathy   Care Team: Primary Care Provider: Valli Gaw, MD   Date of Last Diabetes Related Visit: with PCP on 11/05/23   Recent Summary of Change: 4/14: ??Mounjaro  12.5 mg weekly. Consider prandial insulin . Start Jardiance  (sample given)  Medication Access/Adherence: Prescription drug coverage: Payor: [employer] MEDICARE / Plan: [employer] MEDICARE HMO/PPO / Product Type: *No Product type* / .  - Reports that all medications are not affordable.  However, has reached catastrophic phase and reports medications will be free the remainder of the year.  - Current Patient Assistance: None Lives with wife. Works part time and Loss adjuster, chartered. Wife receives workers comp. Estimate gross income is <$67k.   Since Last visit / History of Present Illness: ?  Patient reports implementing plan from last visit. Denies adverse effects with titration of Mounjaro .  Reported DM Regimen: ?  Jardiance  10 mg daily Basaglar  50 units daily Mounjaro  12.5 mg weekly (Wed)    DM medications tried in the past:?  Ozempic  (titrated to 2 mg dose, then transition to Mounjaro  for added benefit)  Metformin  XR (d/c 2023 - affected nerves in legs/muscle weakness) Glipizide  (2018-2021)  SMBG Per BG meter: On average reports range 200-500 mg/dL.  FBG: 200-285. Sometimes higher in the 300s. Lowest 191, 187.  Night (s/p dinner): More in the 300s-400s. Sometimes in the 200s.   Notes he has a Libre sensor though never started using it due to fear of high glucose alarms.    Hypo/Hyperglycemia: ?  Symptoms of hypoglycemia  since last visit:? no  If yes, it was treated by: n/a  Symptoms of hyperglycemia since last visit:? yes - polyuria  Reported Diet: Patient typically eats 2 meals per day.  Appetite significantly reduced days following Mounjaro  injection. Wife notes he becomes irritable and eats very little before pushing his plate away.   Feels he eats 1/10th of what he used to eat. Lots of vegetables.  Breakfast: skips Lunch: Sandwich (1/2 sandwich) Dinner (4-5 pm): Squash, turnip greens, green beans, dark meat chicken.  Snacks: Occasional  Beverages: Drinks a full quart of milk daily (milk, water sweet tea). Has but back his milk intake.  1 gallon = 1 cup sugar (drinks 1 quart)  DM Prevention:  Statin: Taking; high intensity.?  History of chronic kidney disease? no History of albuminuria? yes, last UACR on 09/14/23 = 41.3 mg/g (previous 13 mg/g) ACE/ARB - Taking Entresto  24/26; Urine MA/CR Ratio - elevated urinary albumin excretion.  Last eye exam: 12/07/22; No retinopathy present Last foot exam: 10/30/2022 Tobacco Use: Former smoker  Immunizations:? Flu: No Record - DUE; Pneumococcal: No Record - DUE; Shingrix: No Record - DUE  Cardiovascular Risk Reduction History of clinical ASCVD? yes History of heart failure? yes History of hyperlipidemia? yes Current BMI: 39.9 kg/m2 (Ht 71 in, Wt 129.8 kg) Taking statin? yes; high intensity (rosuvastatin  20 mg) Taking aspirin ? indicated (secondary prevention); Taking   Taking SGLT-2i? yes Taking GLP- 1 RA? yes   Reported HTN/CHF Regimen: ?  Carvedilol  3.125 mg twice daily  Jardiance  10 mg daily Entresto  24/26 mg twice daily  _______________________________________________  Objective    Review of Systems:? Limited in the setting of virtual visit  Constitutional:? No fever, chills or unintentional weight loss  GI:? No nausea, vomiting, constipation, diarrhea, abdominal pain, dyspepsia, change in bowel habits  Endocrine:? +polyuria. No polyphagia  or blurred vision  Psych:? No depression, anxiety, insomnia    Physical Examination:  Vitals:  Wt Readings from Last 3 Encounters:  11/05/23 286 lb 2 oz (129.8 kg)  09/18/23 279 lb (126.6 kg)  09/11/23 279 lb (126.6 kg)   BP Readings from Last 3 Encounters:  11/05/23 138/72  09/11/23 110/60  07/10/23 124/80   Pulse Readings from Last 3 Encounters:  11/05/23 (!) 101  09/11/23 (!) 109  07/10/23 95    Labs:?  Lab Results  Component Value Date   HGBA1C 15.3 Repeated and verified X2. (H) 09/14/2023   HGBA1C 13.5 (H) 06/11/2023   HGBA1C 14.0 (A) 01/29/2023   GLUCOSE 299 (H) 09/14/2023   MICRALBCREAT 41.3 (H) 09/14/2023   MICRALBCREAT 1.3 06/11/2023   MICRALBCREAT 1.2 04/10/2022   CREATININE 0.91 09/14/2023   CREATININE 0.96 06/20/2023   CREATININE 1.35 06/11/2023   GFR 86.41 09/14/2023   GFR 81.17 06/20/2023   GFR 53.92 (L) 06/11/2023   Lab Results  Component Value Date   CHOL 145 09/14/2023   LDLCALC 69 09/14/2023   LDLCALC 86 12/26/2019   LDLCALC 126 (H) 06/04/2017   LDLDIRECT 113.0 07/26/2022   LDLDIRECT 186.0 01/03/2022   HDL 46.60 09/14/2023   TRIG 151.0 (H) 09/14/2023   TRIG 209.0 (H) 07/26/2022   TRIG 275.0 (H) 01/03/2022   ALT 17 09/14/2023   ALT 13 07/26/2022   AST 20 09/14/2023   AST 22 07/26/2022     Chemistry      Component Value Date/Time   NA 135 09/14/2023 1018   K 4.5 09/14/2023 1018   CL 97 09/14/2023 1018   CO2 27 09/14/2023 1018   BUN 14 09/14/2023 1018   CREATININE 0.91 09/14/2023 1018      Component Value Date/Time   CALCIUM  8.6 09/14/2023 1018   ALKPHOS 109 09/14/2023 1018   AST 20 09/14/2023 1018   ALT 17 09/14/2023 1018   BILITOT 0.8 09/14/2023 1018     The ASCVD Risk score (Arnett DK, et al., 2019) failed to calculate for the following reasons:   Risk score cannot be calculated because patient has a medical history suggesting prior/existing ASCVD  Assessment and Plan:   Medication Access: Patient believes he has reached  catastrophic phase of medicare meaning all copays will be free for remainder of the year. Given his combined income estimate, he may be eligible for Novo Nordisk PAP though income was close to cutoff.   2. Diabetes, type 2: uncontrolled A1c 15.3% (09/14/23). Goal <7% w/o hypoglycemia. Unclear reason for ongoing worsening. Suspect high level of insulin  resistance at this point. Denies c/f knots/lipoatrophy at injection sites. Concentrated insulin  shown to improve glycemic control d/t better absorption. Expect greater efficacy with unit to unit conversion, however given degree of hyperglycemia will still opt for 10% increase. Will follow closely for med adjustments/titration in coming weeks.  Current Regimen: Basasglar 50 units daily, Mounjaro  12.5 mg/wk, Jardiance  10 mg daily  Glucometer data: shows significant/persistent fasting hyperglycemia >200. Higher thorugh the day. Rare instances of BG <200 mg/dL. Diet: Improved from historically-reported though continue to identify areas of improvement. Main concern at this time is beverage (~1 quart of either milk or sweet tea per day). Does not like non-sugar sweetener  though set a goal to use a mixture of Stevia and sugar (wife prepares sweet tea)   Stop basaglar  50 unit Start Toujeo Max u300 - 55 unit once daily Continue all other medications.   Start Libre 3+ (has at home, never started using. Declines training visit) Future Consideration: GLP1-RA: Titrate to max dose as tolerated. Doing well on Mounjaro  12.5 mg.  SGLT2i : Room for Jardiance  titration. Defer to slight improvement in hyperglycemia as to avoid significant glucosuria/volume depletion  SU: Not unreasonable, though defer given alternative options with lower risk of hypoglycemia while on insulin .  TZD: Avoid in setting of HF diagnosis Insulin : Prandial insulin  considered at last PCP visit.  Metformin : Ideal. Renal function good/stable. Patient preference to avoid as he feels it previously  caused him permanent balance issues and leg pain.     2. HTN: controlled historically based on clinic BP 110s-120s/60s-80s tohugh last office visit slightly elevated at 138/72 mmHg (11/05/23). Goal <130/80 mmHg. Denies lightheadedness, dizziness, SOB, CP, vision changes.  Current Regimen: Entresto  24/26 mg BID, carvedilol  3.125 mg BID. Jardiance  recently started.  Continue medications without changes.  BP check next office visit   3. ASCVD (secondary prevention): LDL  above goal though improved on last lipid panel. LDL 69 mg/dL (previously 86, 161). TG 151 mg/dL (0/96/04). LDL goal <55 mg/dL (secondary prevention).  Key risk factors include: diabetes, hypertension, hyperlipidemia, former smoker, BMI >30 kg/m2, sedentary lifestyle, and Known CAD (NSTEMI) Current Regimen: rosuvastatin  20 mg daily Continue medications today without changes.    4. Healthcare Maintenance:  Pneumococcal - Current status: No record - DUE  Shingles - Current status: No record - DUE Influenza - Current status: No record. Due fall 2025  Due to receive the following vaccines: Shingrix and PCV20   Follow Up PCP f/u scheduled Friday.  Follow up with clinical pharmacist via phone closely while adjusting insulin . Will touch base ~1 week. Works 6-10 am - prefers ~1pm visits.    Future Appointments  Date Time Provider Department Center  11/30/2023 11:20 AM Valli Gaw, MD [facility]  12/04/2023  1:30 PM [facility] PHARMACIST [facility]  01/09/2024  1:30 PM Florette Hurry, NP [facility] None  06/18/2024  1:00 PM Bair, Randa Burton, MD [facility]    Daron Ellen, PharmD Clinical Pharmacist [facility] [phone]

## 2023-11-28 ENCOUNTER — Encounter: Payer: Self-pay | Admitting: Pharmacist

## 2023-11-28 NOTE — Progress Notes (Signed)
 Called patient pharmacy to follow up on new prescription.  Prior authorization not required per CMM.   Pharmacist confirms medication is covered for $35/month.  Will need to be ordered for tomorrow.   Appears patient is not yet in the catastrophic coverage phase as he thought.  Per test claim, not certain, though looks as if he may be close based on cost breakdown showing remaining initial benefit amount followed by catastrophic coverage     Called insurance to confirm Medicare stage. They report they can only give this information to the patient.

## 2023-11-30 ENCOUNTER — Ambulatory Visit (INDEPENDENT_AMBULATORY_CARE_PROVIDER_SITE_OTHER)

## 2023-11-30 ENCOUNTER — Ambulatory Visit (INDEPENDENT_AMBULATORY_CARE_PROVIDER_SITE_OTHER): Admitting: Family Medicine

## 2023-11-30 ENCOUNTER — Encounter: Payer: Self-pay | Admitting: Family Medicine

## 2023-11-30 VITALS — BP 132/76 | HR 105 | Temp 99.0°F | Resp 20 | Ht 71.0 in | Wt 284.0 lb

## 2023-11-30 DIAGNOSIS — E785 Hyperlipidemia, unspecified: Secondary | ICD-10-CM | POA: Diagnosis not present

## 2023-11-30 DIAGNOSIS — I152 Hypertension secondary to endocrine disorders: Secondary | ICD-10-CM

## 2023-11-30 DIAGNOSIS — G629 Polyneuropathy, unspecified: Secondary | ICD-10-CM | POA: Diagnosis not present

## 2023-11-30 DIAGNOSIS — E118 Type 2 diabetes mellitus with unspecified complications: Secondary | ICD-10-CM | POA: Diagnosis not present

## 2023-11-30 DIAGNOSIS — Z7984 Long term (current) use of oral hypoglycemic drugs: Secondary | ICD-10-CM | POA: Diagnosis not present

## 2023-11-30 DIAGNOSIS — G8929 Other chronic pain: Secondary | ICD-10-CM

## 2023-11-30 DIAGNOSIS — E1159 Type 2 diabetes mellitus with other circulatory complications: Secondary | ICD-10-CM

## 2023-11-30 DIAGNOSIS — M545 Low back pain, unspecified: Secondary | ICD-10-CM | POA: Diagnosis not present

## 2023-11-30 DIAGNOSIS — K219 Gastro-esophageal reflux disease without esophagitis: Secondary | ICD-10-CM | POA: Diagnosis not present

## 2023-11-30 DIAGNOSIS — E1169 Type 2 diabetes mellitus with other specified complication: Secondary | ICD-10-CM

## 2023-11-30 DIAGNOSIS — I2510 Atherosclerotic heart disease of native coronary artery without angina pectoris: Secondary | ICD-10-CM

## 2023-11-30 DIAGNOSIS — M47816 Spondylosis without myelopathy or radiculopathy, lumbar region: Secondary | ICD-10-CM | POA: Diagnosis not present

## 2023-11-30 DIAGNOSIS — Z7985 Long-term (current) use of injectable non-insulin antidiabetic drugs: Secondary | ICD-10-CM | POA: Diagnosis not present

## 2023-11-30 LAB — POCT GLYCOSYLATED HEMOGLOBIN (HGB A1C): Hemoglobin A1C: 11.5 % — AB (ref 4.0–5.6)

## 2023-11-30 MED ORDER — EMPAGLIFLOZIN 25 MG PO TABS: 25.0000 mg | ORAL_TABLET | Freq: Every day | ORAL | 6 refills | Status: DC

## 2023-11-30 MED ORDER — TIRZEPATIDE 15 MG/0.5ML ~~LOC~~ SOAJ: 15.0000 mg | SUBCUTANEOUS | 3 refills | Status: DC

## 2023-11-30 MED ORDER — PANTOPRAZOLE SODIUM 40 MG PO TBEC: 40.0000 mg | DELAYED_RELEASE_TABLET | Freq: Every day | ORAL | 1 refills | Status: DC

## 2023-11-30 NOTE — Patient Instructions (Addendum)
 It was a pleasure meeting you today. Thank you for allowing me to take part in your health care.  Our goals for today as we discussed include:  Increase Mounjaro  to 15 mg weekly Increase Jardiance  to 25 mg daily  A1c today 11.3.  Last A1c 15.3 Continue to work on healthy diet  Continue all other diabetic medication  Will get back xray today  Follow up with Heidi Llamas, pharmacy, tomorrow   This is a list of the screening recommended for you and due dates:  Health Maintenance  Topic Date Due   Medicare Annual Wellness Visit  Never done   Pneumonia Vaccine (1 of 2 - PCV) Never done   Zoster (Shingles) Vaccine (1 of 2) Never done   Complete foot exam   10/30/2023   Eye exam for diabetics  12/07/2023   Flu Shot  02/22/2024   Hemoglobin A1C  06/01/2024   Yearly kidney function blood test for diabetes  09/13/2024   Yearly kidney health urinalysis for diabetes  09/13/2024   Screening for Lung Cancer  09/17/2024   Colon Cancer Screening  11/13/2025   DTaP/Tdap/Td vaccine (3 - Td or Tdap) 12/10/2029   Hepatitis C Screening  Completed   HPV Vaccine  Aged Out   Meningitis B Vaccine  Aged Out   COVID-19 Vaccine  Discontinued        If you have any questions or concerns, please do not hesitate to call the office at [phone].  I look forward to our next visit and until then take care and stay safe.  Regards,   Valli Gaw, MD   [facility]

## 2023-11-30 NOTE — Progress Notes (Unsigned)
 SUBJECTIVE:   Chief Complaint  Patient presents with   Diabetes    4 week follow up since starting Jardiance    HPI Presents to clinic for follow up chronic disease management  Discussed the use of AI scribe software for clinical note transcription with the patient, who gave verbal consent to proceed.  History of Present Illness Michael Jordan is a 68 year old male with diabetes who presents for a follow-up visit regarding his diabetes management.  He is currently on Mounjaro  at a dose of 12.5 mg and Jardiance  at 10 mg, which he tolerates well, although he sometimes experiences increased hunger. His blood sugar levels remain elevated, often reaching nearly 500 mg/dL, with occasional readings below 200 mg/dL. Over the past three months, his A1c has decreased from 15.3% to 11.5%.  He experiences a fluctuating appetite, sometimes feeling hungry and other times lacking the desire to eat. His blood sugar levels do not significantly decrease even when he abstains from eating. He has been attempting to improve his diet by incorporating more vegetables and slightly reducing his milk intake.  He experiences frequent urination and sometimes notices a sugary appearance in his urine. He consumes a lot of water, not necessarily due to thirst, but because it is readily available.  He has a history of heart disease, including three stents. He reports balance issues, particularly after sitting for a while, describing it as 'like a toddler standing.' He experiences muscle aches rather than joint pain, with some tingling in his feet. He previously discontinued metformin  due to concerns about its side effects on his legs.  He has not had his ears checked despite noticing some hearing loss, particularly in his left ear. No recent falls, but he mentions occasional stumbling.     PERTINENT PMH / PSH: As above  OBJECTIVE:  BP 132/76   Pulse (!) 105   Temp 99 F (37.2 C)   Resp 20   Ht 5\' 11"  (1.803 m)    Wt 284 lb (128.8 kg)   SpO2 99%   BMI 39.61 kg/m    Physical Exam Vitals reviewed.  Constitutional:      General: He is not in acute distress.    Appearance: Normal appearance. He is obese. He is not ill-appearing, toxic-appearing or diaphoretic.  Eyes:     General:        Right eye: No discharge.        Left eye: No discharge.  Cardiovascular:     Rate and Rhythm: Normal rate and regular rhythm.     Heart sounds: Normal heart sounds.  Pulmonary:     Effort: Pulmonary effort is normal.     Breath sounds: Normal breath sounds.  Abdominal:     General: Bowel sounds are normal.  Musculoskeletal:        General: Normal range of motion.     Cervical back: Normal range of motion.  Skin:    General: Skin is warm and dry.  Neurological:     Mental Status: He is alert and oriented to person, place, and time. Mental status is at baseline.  Psychiatric:        Mood and Affect: Mood normal.        Behavior: Behavior normal.        Thought Content: Thought content normal.        Judgment: Judgment normal.    {Perform Simple Foot Exam  Perform Detailed exam:1} {Insert foot Exam (Optional):30965}  11/30/2023   11:38 AM 11/05/2023    4:11 PM 09/11/2023    2:04 PM 06/11/2023    1:15 PM 03/12/2023    1:15 PM  Depression screen PHQ 2/9  Decreased Interest 0 0 0 0 0  Down, Depressed, Hopeless 0 0 0 0 0  PHQ - 2 Score 0 0 0 0 0  Altered sleeping 0 0 0 0 0  Tired, decreased energy 0 0 0 1 0  Change in appetite 1 0 0 2 0  Feeling bad or failure about yourself  0 0 0 0 0  Trouble concentrating 0 0 0 0 0  Moving slowly or fidgety/restless 0 0 0 0 0  Suicidal thoughts 0 0 0 0 0  PHQ-9 Score 1 0 0 3 0  Difficult doing work/chores Not difficult at all Not difficult at all Not difficult at all Not difficult at all Not difficult at all      11/30/2023   11:38 AM 11/05/2023    4:11 PM 09/11/2023    2:04 PM 06/11/2023    1:15 PM  GAD 7 : Generalized Anxiety Score  Nervous, Anxious,  on Edge 0 0 0 0  Control/stop worrying 0 0 0 0  Worry too much - different things 0 0 0 0  Trouble relaxing 0 0 0 0  Restless 0 0 0 0  Easily annoyed or irritable 0 0 0 0  Afraid - awful might happen 0 0 0 0  Total GAD 7 Score 0 0 0 0  Anxiety Difficulty Not difficult at all Not difficult at all Not difficult at all Not difficult at all    ASSESSMENT/PLAN:  Chronic bilateral low back pain, unspecified whether sciatica present -     DG Lumbar Spine 2-3 Views  Gastroesophageal reflux disease, unspecified whether esophagitis present -     Pantoprazole  Sodium; Take 1 tablet (40 mg total) by mouth daily. Take 30-60 minutes before breakfast  Dispense: 90 tablet; Refill: 1  Type 2 diabetes mellitus with complications (HCC) -     POCT glycosylated hemoglobin (Hb A1C) -     Tirzepatide ; Inject 15 mg into the skin once a week.  Dispense: 6 mL; Refill: 3 -     Empagliflozin ; Take 1 tablet (25 mg total) by mouth daily before breakfast.  Dispense: 30 tablet; Refill: 6  Hyperlipidemia associated with type 2 diabetes mellitus (HCC)  Hypertension associated with diabetes (HCC)    Assessment and Plan Assessment & Plan Type 2 diabetes mellitus with hyperglycemia  Peripheral neuropathy Tingling in feet and muscle weakness noted, possibly related to diabetes or musculoskeletal issues. Declined referral to physical therapy.  Coronary artery disease with stents Coronary artery disease with three stents. No new cardiac symptoms. Emphasized diabetes management to prevent further cardiovascular complications.  Hypertension Blood pressure well-managed.  General Health Maintenance Discussed lifestyle modifications to manage diabetes and prevent cardiovascular complications. - Encourage regular physical activity. - Advise moderation in milk consumption and preference for skim milk.  Follow-up Coordination with pharmacy and endocrinology for ongoing diabetes management and cortisol testing. -  Send note with instructions for dexamethasone test. - Follow up with pharmacy for medication management. - Monitor blood glucose levels regularly, especially fasting and postprandial.       PDMP reviewed  Return in about 3 months (around 03/01/2024) for PCP.  Valli Gaw, MD

## 2023-12-03 ENCOUNTER — Ambulatory Visit: Admitting: Family Medicine

## 2023-12-04 ENCOUNTER — Other Ambulatory Visit

## 2023-12-04 NOTE — Assessment & Plan Note (Signed)
 Persistent hyperglycemia with occasional glucose levels up to 500 mg/dL. Hemoglobin A1c improved from 15.3% to 11.5%. Concerns about hypercortisolism due to persistent hyperglycemia despite medication adjustments. Discussed importance of diabetes management to prevent cardiovascular complications. - Increase Mounjaro  to 15 mg. - Increase Jardiance  to 25 mg. - Coordinate with pharmacy for medication management  - Continue insulin  therapy. - Encourage dietary modifications, including increased vegetable intake and reduced milk consumption.

## 2023-12-06 ENCOUNTER — Ambulatory Visit: Payer: Self-pay | Admitting: Family Medicine

## 2023-12-06 ENCOUNTER — Encounter: Payer: Self-pay | Admitting: Family Medicine

## 2023-12-06 DIAGNOSIS — M545 Low back pain, unspecified: Secondary | ICD-10-CM | POA: Insufficient documentation

## 2023-12-06 NOTE — Assessment & Plan Note (Signed)
 Coronary artery disease with three stents. No new cardiac symptoms. Emphasized diabetes management to prevent further cardiovascular complications. Continue Carvedilol  Continue Rosuvastatin 

## 2023-12-06 NOTE — Assessment & Plan Note (Signed)
 Tingling in feet and muscle weakness noted, possibly related to diabetes or musculoskeletal issues. Declined referral to physical therapy. Declined Gabapentin

## 2023-12-06 NOTE — Assessment & Plan Note (Signed)
 Blood pressure well-managed. Continue current antihypertensives

## 2023-12-06 NOTE — Assessment & Plan Note (Signed)
 Continues to have low back pain from and has noticed worsening issue with balance.  No red flags. No pain today.   Xray L spine today

## 2023-12-06 NOTE — Assessment & Plan Note (Signed)
 On Crestor  and Zetia . -Continue Crestor  20 mg daily -Continue Zetia  10 mg daily

## 2023-12-09 ENCOUNTER — Other Ambulatory Visit: Payer: Self-pay | Admitting: Family Medicine

## 2023-12-09 DIAGNOSIS — E118 Type 2 diabetes mellitus with unspecified complications: Secondary | ICD-10-CM

## 2023-12-14 ENCOUNTER — Other Ambulatory Visit (INDEPENDENT_AMBULATORY_CARE_PROVIDER_SITE_OTHER): Admitting: Pharmacist

## 2023-12-14 DIAGNOSIS — E118 Type 2 diabetes mellitus with unspecified complications: Secondary | ICD-10-CM

## 2023-12-14 NOTE — Progress Notes (Signed)
 Brief Telephone Documentation Reason for Call: Follow up on medication access / recent dose change  Summary of Call: Patient reports doing well since last visit. Confirms increasing Jardiance  as instructed by PCP w/o concerns for adverse effects. Currently finishing 10 mg supply (2 tabs). States Rx is ready for pick up at the pharmacy which he will get next week along with his next Mounjaro  dose, 15 mg weekly. Is close to catastrophic coverage phase (pharmacy estimates ~$200 left).   Continues to use basaglar  50 units daily, has not yet picked up Toujeo . Notes preference to see how recent medication changes do, then may be open to insulin  switch.   Reported Medications: Basaglar  50 units daily Jardiance  25 mg daily Mounjaro  12.5 mg weekly - plans to titrate to 15 mg dose next week  SMBG: Glucometer - have Libre 3+ though have not set it up yet. Decline training visit though wife was a Engineer, civil (consulting) and they feel confident about setup.  Not checking BG regularly at this time. BG during phone call on finger stick is 159 mg/dL (PP lunch - sandwich) which they report is the lowest reading they have seen in awhile.   No s/sx hypo/hyperglycemia.   Follow Up: Patient given direct line for further questions/concerns. Okay to speak w wife. Pt preference to also have wife on call (manages his prescriptions/medical information. Wife = nurse) Check-in ~3-4 weeks to evaluate efficacy of recent med changes  Future Appointments  Date Time Provider Department Center  01/09/2024  1:30 PM Florette Hurry, NP [facility] None  01/11/2024  1:00 PM [facility] PHARMACIST [facility]  03/03/2024 11:40 AM Jeffie Mingle, MD [facility]  06/18/2024  1:00 PM Bair, Randa Burton, MD [facility]   Daron Ellen, PharmD Clinical Pharmacist [facility] [phone]

## 2024-01-09 ENCOUNTER — Ambulatory Visit: Attending: Nurse Practitioner | Admitting: Nurse Practitioner

## 2024-01-09 ENCOUNTER — Encounter: Payer: Self-pay | Admitting: Nurse Practitioner

## 2024-01-09 VITALS — BP 130/70 | HR 79 | Ht 71.0 in | Wt 286.1 lb

## 2024-01-09 DIAGNOSIS — I5022 Chronic systolic (congestive) heart failure: Secondary | ICD-10-CM | POA: Diagnosis not present

## 2024-01-09 DIAGNOSIS — I255 Ischemic cardiomyopathy: Secondary | ICD-10-CM | POA: Diagnosis not present

## 2024-01-09 DIAGNOSIS — E785 Hyperlipidemia, unspecified: Secondary | ICD-10-CM

## 2024-01-09 DIAGNOSIS — I2510 Atherosclerotic heart disease of native coronary artery without angina pectoris: Secondary | ICD-10-CM | POA: Diagnosis not present

## 2024-01-09 DIAGNOSIS — I10 Essential (primary) hypertension: Secondary | ICD-10-CM

## 2024-01-09 NOTE — Progress Notes (Signed)
 Office Visit    Patient Name: Michael Jordan Date of Encounter: 01/09/2024  Primary Care Provider:  Valli Gaw, MD Primary Cardiologist:  Antionette Kirks, MD  Chief Complaint    68 y.o. male with a history of CAD, ischemic cardiomyopathy, chronic heart failure with midrange ejection fraction, hypertension, hyperlipidemia, obesity, diabetes, and remote tobacco abuse, who presents for follow-up related to CAD/HFmrEF.  Past Medical History   Subjective   Past Medical History:  Diagnosis Date   Abnormal stress test    CAD (coronary artery disease)    a. 09/2015 Ant STEMI/PCI: LAD 100p (3.0x18 Xience Alpine DES), RCA 80p/172m (CTO)->fills via collats, EF 45-50%; b. 10/2015 MV: fixed septal/inf defects w/ dramatic ECG changes; c. 10/2015 Cath: LM nl, LAD patent stent, D1 40, SP1 40, LCX 81m, RCA 80p/144m, EF 45-50%-->Med Rx.   Cardiomyopathy, ischemic    a. 09/2015 Echo: EF 35-40%, sev apical, periapical, antapical HK, mild to mod ant, inf HK, Gr1 DD; b. 11/2019 Echo: EF 45-50%, GrI DD; c. 08/2022 Echo: EF 40-45%, glob HK, mod LVH, nl RV fxn, mild MR.   Heart failure with mid-range ejection fraction (HCC)    a. 09/2015 Echo: EF 35-40%; b. 11/2019 Echo: EF 45-50%; c. 08/2022 Echo: EF 40-45%, glob HK.   History of tobacco abuse    a. Quit 05/2015.   Hyperlipidemia    Morbid obesity (HCC)    Pulmonary nodules    Type II diabetes mellitus (HCC)    Past Surgical History:  Procedure Laterality Date   CARDIAC CATHETERIZATION N/A 09/30/2015   Procedure: Left Heart Cath and Coronary Angiography;  Surgeon: Wenona Hamilton, MD;  Location: [facility] INVASIVE CV LAB;  Service: Cardiovascular;  Laterality: N/A;   CARDIAC CATHETERIZATION N/A 09/30/2015   Procedure: Coronary Stent Intervention;  Surgeon: Wenona Hamilton, MD;  Location: [facility] INVASIVE CV LAB;  Service: Cardiovascular;  Laterality: N/A;   CARDIAC CATHETERIZATION N/A 11/11/2015   Procedure: Left Heart Cath and Coronary Angiography;  Surgeon:  Wenona Hamilton, MD;  Location: [facility] INVASIVE CV LAB;  Service: Cardiovascular;  Laterality: N/A;   COLONOSCOPY WITH PROPOFOL  N/A 11/14/2022   Procedure: COLONOSCOPY WITH PROPOFOL ;  Surgeon: Luke Salaam, MD;  Location: [facility] ENDOSCOPY;  Service: Gastroenterology;  Laterality: N/A;   TONSILLECTOMY     TONSILLECTOMY      Allergies  No Known Allergies     History of Present Illness      68 y.o. y/o male with a history of CAD, ischemic cardiomyopathy, chronic heart failure with midrange ejection fraction, hypertension, hyperlipidemia, obesity, diabetes, and remote tobacco abuse.  He was admitted in March 2017 with anterior STEMI and finding of a totally occluded proximal LAD as well as chronic total occlusion of the mid/distal RCA.  The LAD was felt to be the infarct vessel and this was successfully treated with a drug-eluting stent.  EF was 35 to 40% by echo.  He subsequently underwent stress testing April 2017 so that he may return to work and he had significant ST segment changes with peak stress.  He underwent relook catheterization showing patent LAD with otherwise stable anatomy.  Follow-up echo in May 2021 showed persistent mild depression of LV function with an EF of 45 to 50% and mild LVH.  He was lost to follow-up for several years due to lack of insurance but subsequently reestablished and underwent repeat echo in February 2024 showed an EF of 40 to 45% with mild MR.  Entresto  therapy was added at  that time.   Mr. Michael Jordan was last seen in cardiology clinic in December 2024, at which time he was doing well without chest pain or dyspnea.  He was taking Mounjaro  and losing weight.  A1c increased to 15.3 in February 2025 when he was placed on Jardiance .  Most recent A1c improved to 11.5.  Mr. Michael Jordan notes that he has been doing well.  He works part-time at [employer] and notes that he is fairly active at work without symptoms or limitations.  He walks a fair amount during his shifts.  He denies  chest pain, dyspnea, palpitations, PND, orthopnea, dizziness, syncope, edema, or early satiety.  He notes significant improvement in his blood sugar since being placed on Jardiance .  He has a Education officer, community and though he has not used that yet, he is planning on starting to exercise. Objective   Home Medications    Current Outpatient Medications  Medication Sig Dispense Refill   Accu-Chek Softclix Lancets lancets Use to check glucose 1x/day. Dx code E11.9. 204 each 3   aspirin  81 MG chewable tablet Chew 1 tablet (81 mg total) by mouth daily.     blood glucose meter kit and supplies KIT Dispense based on patient and insurance preference. Use once daily as directed. (FOR ICD-10 E11.9). 1 each 0   carvedilol  (COREG ) 3.125 MG tablet TAKE 1 TABLET BY MOUTH 2 TIMES DAILY. 180 tablet 1   Continuous Glucose Sensor (FREESTYLE LIBRE 3 SENSOR) MISC PLACE 1 SENSOR ON THE SKIN EVERY 14 DAYS. USE TO CHECK GLUCOSE CONTINUOUSLY 2 each 2   empagliflozin  (JARDIANCE ) 25 MG TABS tablet Take 1 tablet (25 mg total) by mouth daily before breakfast. 30 tablet 6   ezetimibe  (ZETIA ) 10 MG tablet TAKE 1 TABLET BY MOUTH EVERY DAY 90 tablet 3   insulin  glargine, 2 Unit Dial, (TOUJEO  MAX SOLOSTAR) 300 UNIT/ML Solostar Pen Inject 56 units sq once daily. May increase q3-4 days only as instructed. MDD 65 unit. 6 mL 0   Insulin  Pen Needle (ULTRA FLO INSULIN  PEN NEEDLES) 33G X 4 MM MISC Use daily with Kwikpen 100 each 3   nitroGLYCERIN  (NITROSTAT ) 0.4 MG SL tablet Place 1 tablet (0.4 mg total) under the tongue every 5 (five) minutes as needed for chest pain. 25 tablet 0   nystatin  powder Apply 1 Application topically 3 (three) times daily. 60 g 0   ONETOUCH ULTRA test strip USE ONCE DAILY AS DIRECTED E11.9 (PT USES ONE TOUCH) 100 strip 1   pantoprazole  (PROTONIX ) 40 MG tablet Take 1 tablet (40 mg total) by mouth daily. Take 30-60 minutes before breakfast 90 tablet 1   rosuvastatin  (CRESTOR ) 20 MG tablet Take 1 tablet  (20 mg total) by mouth daily. 90 tablet 3   sacubitril -valsartan  (ENTRESTO ) 24-26 MG Take 1 tablet by mouth 2 (two) times daily. 60 tablet 2   tirzepatide  (MOUNJARO ) 15 MG/0.5ML Pen Inject 15 mg into the skin once a week. 6 mL 3   Vitamin D , Ergocalciferol , (DRISDOL ) 1.25 MG (50000 UNIT) CAPS capsule Take 1 capsule (50,000 Units total) by mouth every 7 (seven) days. 12 capsule 1   fluconazole  (DIFLUCAN ) 150 MG tablet Take one tablet by mouth today, may repeat every 5 days if symptoms persist, for a total of 3 doses. (Patient not taking: Reported on 01/09/2024) 3 tablet 0   No current facility-administered medications for this visit.     Physical Exam    VS:  BP 130/70 (BP Location: Left Arm, Patient Position: Sitting,  Cuff Size: Large)   Pulse 79   Ht 5' 11 (1.803 m)   Wt 286 lb 2 oz (129.8 kg)   SpO2 98%   BMI 39.91 kg/m  , BMI Body mass index is 39.91 kg/m.       GEN: Well nourished, well developed, in no acute distress. HEENT: normal. Neck: Supple, no JVD, carotid bruits, or masses. Cardiac: RRR, no murmurs, rubs, or gallops. No clubbing, cyanosis, edema.  Radials 2+/PT 2+ and equal bilaterally.  Respiratory:  Respirations regular and unlabored, clear to auscultation bilaterally. GI: Soft, nontender, nondistended, BS + x 4. MS: no deformity or atrophy. Skin: warm and dry, no rash. Neuro:  Strength and sensation are intact. Psych: Normal affect.  Accessory Clinical Findings    ECG personally reviewed by me today - EKG Interpretation Date/Time:  Wednesday January 09 2024 13:41:48 EDT Ventricular Rate:  79 PR Interval:  130 QRS Duration:  130 QT Interval:  408 QTC Calculation: 467 R Axis:   44  Text Interpretation: Normal sinus rhythm Right bundle branch block Confirmed by Laneta Pintos [phone]) on 01/09/2024 1:45:56 PM  - no acute changes.  Lab Results  Component Value Date   WBC 11.6 (H) 09/14/2023   HGB 15.9 09/14/2023   HCT 48.3 09/14/2023   MCV 87.1 09/14/2023    PLT 301.0 09/14/2023   Lab Results  Component Value Date   CREATININE 0.91 09/14/2023   BUN 14 09/14/2023   NA 135 09/14/2023   K 4.5 09/14/2023   CL 97 09/14/2023   CO2 27 09/14/2023   Lab Results  Component Value Date   ALT 17 09/14/2023   AST 20 09/14/2023   ALKPHOS 109 09/14/2023   BILITOT 0.8 09/14/2023   Lab Results  Component Value Date   CHOL 145 09/14/2023   HDL 46.60 09/14/2023   LDLCALC 69 09/14/2023   LDLDIRECT 113.0 07/26/2022   TRIG 151.0 (H) 09/14/2023   CHOLHDL 3 09/14/2023    Lab Results  Component Value Date   HGBA1C 11.5 (A) 11/30/2023   Lab Results  Component Value Date   TSH 1.02 09/14/2023       Assessment & Plan    1.  Coronary artery disease: Status post prior anterior MI with LAD stenting with known residual RCA disease and stable anatomy on catheterization in April 2017.  He has done well over the past several months without chest pain or dyspnea and is walking fairly regularly at work but not necessarily exercising.  He remains on aspirin , beta-blocker, statin, and Zetia  and is tolerating well.  I strongly encouraged 30 minutes of exercise at least 5 days/week.  2.  Chronic heart failure with midrange ejection fraction/ischemic cardiomyopathy: EF 40-45% by echocardiogram in February of 2024.  As above, he has been doing well without significant dyspnea or edema.  He is euvolemic on examination.  Heart rate and blood pressure stable.  Continue beta-blocker, Entresto , and Jardiance .  He is not currently on spironolactone  and in the setting of history of high normal potassiums of 4.5-4.6, will defer at this time.  He is not requiring a diuretic at this time.  3.  Primary hypertension: Blood pressure stable at 130/70.  Continue beta-blocker, Entresto .  4.  Morbid obesity: No significant weight loss on Mounjaro , though he has recently noted reduction in appetite.  As above, recommend 30 minutes of exercise at least 5 days a week.  5.  Type 2  diabetes mellitus: A1c improved from 15 earlier this year to  11.5 in May.  He has been managed by primary care and is currently on Mounjaro , Toujeo , and Jardiance .  6.  Disposition: Follow-up in 6 months or sooner if necessary.  Laneta Pintos, NP 01/09/2024, 5:53 PM

## 2024-01-09 NOTE — Patient Instructions (Signed)
 Medication Instructions:  Your physician recommends that you continue on your current medications as directed. Please refer to the Current Medication list given to you today.   *If you need a refill on your cardiac medications before your next appointment, please call your pharmacy*  Lab Work: None ordered at this time  If you have labs (blood work) drawn today and your tests are completely normal, you will receive your results only by: MyChart Message (if you have MyChart) OR A paper copy in the mail If you have any lab test that is abnormal or we need to change your treatment, we will call you to review the results.  Testing/Procedures: None ordered at this time   Follow-Up: At [facility], you and your health needs are our priority.  As part of our continuing mission to provide you with exceptional heart care, our providers are all part of one team.  This team includes your primary Cardiologist (physician) and Advanced Practice Providers or APPs (Physician Assistants and Nurse Practitioners) who all work together to provide you with the care you need, when you need it.  Your next appointment:   6 month(s)  Provider:   Antionette Kirks, MD    We recommend signing up for the patient portal called MyChart.  Sign up information is provided on this After Visit Summary.  MyChart is used to connect with patients for Virtual Visits (Telemedicine).  Patients are able to view lab/test results, encounter notes, upcoming appointments, etc.  Non-urgent messages can be sent to your provider as well.   To learn more about what you can do with MyChart, go to [email or web address].

## 2024-01-11 ENCOUNTER — Other Ambulatory Visit

## 2024-01-17 ENCOUNTER — Other Ambulatory Visit: Payer: Self-pay | Admitting: Cardiovascular Disease

## 2024-01-17 DIAGNOSIS — I10 Essential (primary) hypertension: Secondary | ICD-10-CM

## 2024-02-19 DIAGNOSIS — E1129 Type 2 diabetes mellitus with other diabetic kidney complication: Secondary | ICD-10-CM | POA: Diagnosis not present

## 2024-02-19 DIAGNOSIS — E66813 Obesity, class 3: Secondary | ICD-10-CM | POA: Diagnosis not present

## 2024-02-19 DIAGNOSIS — R809 Proteinuria, unspecified: Secondary | ICD-10-CM | POA: Diagnosis not present

## 2024-02-19 DIAGNOSIS — Z1331 Encounter for screening for depression: Secondary | ICD-10-CM | POA: Diagnosis not present

## 2024-02-19 DIAGNOSIS — E1169 Type 2 diabetes mellitus with other specified complication: Secondary | ICD-10-CM | POA: Diagnosis not present

## 2024-02-19 DIAGNOSIS — E1165 Type 2 diabetes mellitus with hyperglycemia: Secondary | ICD-10-CM | POA: Diagnosis not present

## 2024-02-19 DIAGNOSIS — E1159 Type 2 diabetes mellitus with other circulatory complications: Secondary | ICD-10-CM | POA: Diagnosis not present

## 2024-02-19 DIAGNOSIS — I10 Essential (primary) hypertension: Secondary | ICD-10-CM | POA: Diagnosis not present

## 2024-02-19 DIAGNOSIS — Z794 Long term (current) use of insulin: Secondary | ICD-10-CM | POA: Diagnosis not present

## 2024-03-03 ENCOUNTER — Ambulatory Visit: Admitting: Internal Medicine

## 2024-04-11 DIAGNOSIS — E66813 Obesity, class 3: Secondary | ICD-10-CM | POA: Diagnosis not present

## 2024-04-11 DIAGNOSIS — E782 Mixed hyperlipidemia: Secondary | ICD-10-CM | POA: Diagnosis not present

## 2024-04-11 DIAGNOSIS — R809 Proteinuria, unspecified: Secondary | ICD-10-CM | POA: Diagnosis not present

## 2024-04-11 DIAGNOSIS — Z91148 Patient's other noncompliance with medication regimen for other reason: Secondary | ICD-10-CM | POA: Diagnosis not present

## 2024-04-11 DIAGNOSIS — E1159 Type 2 diabetes mellitus with other circulatory complications: Secondary | ICD-10-CM | POA: Diagnosis not present

## 2024-04-11 DIAGNOSIS — E1165 Type 2 diabetes mellitus with hyperglycemia: Secondary | ICD-10-CM | POA: Diagnosis not present

## 2024-04-11 DIAGNOSIS — E1169 Type 2 diabetes mellitus with other specified complication: Secondary | ICD-10-CM | POA: Diagnosis not present

## 2024-04-11 DIAGNOSIS — E1129 Type 2 diabetes mellitus with other diabetic kidney complication: Secondary | ICD-10-CM | POA: Diagnosis not present

## 2024-04-11 DIAGNOSIS — I10 Essential (primary) hypertension: Secondary | ICD-10-CM | POA: Diagnosis not present

## 2024-04-11 DIAGNOSIS — Z794 Long term (current) use of insulin: Secondary | ICD-10-CM | POA: Diagnosis not present

## 2024-04-22 NOTE — Progress Notes (Signed)
 Michael Jordan                                          MRN: [id number]   04/22/2024   The [facility] Quality Team Specialist reviewed this patient medical record for the purposes of chart review for care gap closure. The following were reviewed: chart review for care gap closure-glycemic status assessment. A1c out of range 11.5    [facility]BCI Quality Team

## 2024-06-02 DIAGNOSIS — Z794 Long term (current) use of insulin: Secondary | ICD-10-CM | POA: Diagnosis not present

## 2024-06-02 DIAGNOSIS — E1165 Type 2 diabetes mellitus with hyperglycemia: Secondary | ICD-10-CM | POA: Diagnosis not present

## 2024-06-02 DIAGNOSIS — I10 Essential (primary) hypertension: Secondary | ICD-10-CM | POA: Diagnosis not present

## 2024-06-02 DIAGNOSIS — E1159 Type 2 diabetes mellitus with other circulatory complications: Secondary | ICD-10-CM | POA: Diagnosis not present

## 2024-06-02 DIAGNOSIS — E782 Mixed hyperlipidemia: Secondary | ICD-10-CM | POA: Diagnosis not present

## 2024-06-02 DIAGNOSIS — R809 Proteinuria, unspecified: Secondary | ICD-10-CM | POA: Diagnosis not present

## 2024-06-02 DIAGNOSIS — E66813 Obesity, class 3: Secondary | ICD-10-CM | POA: Diagnosis not present

## 2024-06-02 DIAGNOSIS — E1129 Type 2 diabetes mellitus with other diabetic kidney complication: Secondary | ICD-10-CM | POA: Diagnosis not present

## 2024-06-02 DIAGNOSIS — E1169 Type 2 diabetes mellitus with other specified complication: Secondary | ICD-10-CM | POA: Diagnosis not present

## 2024-06-18 ENCOUNTER — Ambulatory Visit (INDEPENDENT_AMBULATORY_CARE_PROVIDER_SITE_OTHER)

## 2024-06-18 ENCOUNTER — Telehealth: Payer: Self-pay

## 2024-06-18 VITALS — BP 120/70 | HR 103 | Temp 98.6°F | Ht 71.0 in | Wt 269.6 lb

## 2024-06-18 DIAGNOSIS — K219 Gastro-esophageal reflux disease without esophagitis: Secondary | ICD-10-CM

## 2024-06-18 DIAGNOSIS — Z125 Encounter for screening for malignant neoplasm of prostate: Secondary | ICD-10-CM

## 2024-06-18 DIAGNOSIS — Z7984 Long term (current) use of oral hypoglycemic drugs: Secondary | ICD-10-CM | POA: Diagnosis not present

## 2024-06-18 DIAGNOSIS — E1165 Type 2 diabetes mellitus with hyperglycemia: Secondary | ICD-10-CM | POA: Diagnosis not present

## 2024-06-18 DIAGNOSIS — Z794 Long term (current) use of insulin: Secondary | ICD-10-CM

## 2024-06-18 DIAGNOSIS — N179 Acute kidney failure, unspecified: Secondary | ICD-10-CM

## 2024-06-18 DIAGNOSIS — Z7985 Long-term (current) use of injectable non-insulin antidiabetic drugs: Secondary | ICD-10-CM | POA: Diagnosis not present

## 2024-06-18 DIAGNOSIS — E559 Vitamin D deficiency, unspecified: Secondary | ICD-10-CM

## 2024-06-18 DIAGNOSIS — L304 Erythema intertrigo: Secondary | ICD-10-CM

## 2024-06-18 DIAGNOSIS — E1169 Type 2 diabetes mellitus with other specified complication: Secondary | ICD-10-CM | POA: Diagnosis not present

## 2024-06-18 DIAGNOSIS — E1159 Type 2 diabetes mellitus with other circulatory complications: Secondary | ICD-10-CM

## 2024-06-18 DIAGNOSIS — E785 Hyperlipidemia, unspecified: Secondary | ICD-10-CM | POA: Diagnosis not present

## 2024-06-18 DIAGNOSIS — E1122 Type 2 diabetes mellitus with diabetic chronic kidney disease: Secondary | ICD-10-CM | POA: Diagnosis not present

## 2024-06-18 DIAGNOSIS — N189 Chronic kidney disease, unspecified: Secondary | ICD-10-CM | POA: Diagnosis not present

## 2024-06-18 DIAGNOSIS — E118 Type 2 diabetes mellitus with unspecified complications: Secondary | ICD-10-CM

## 2024-06-18 DIAGNOSIS — I2510 Atherosclerotic heart disease of native coronary artery without angina pectoris: Secondary | ICD-10-CM

## 2024-06-18 MED ORDER — NYSTATIN 100000 UNIT/GM EX POWD
1.0000 | Freq: Three times a day (TID) | CUTANEOUS | 3 refills | Status: AC | PRN
Start: 2024-06-18 — End: ?

## 2024-06-18 MED ORDER — PANTOPRAZOLE SODIUM 40 MG PO TBEC: 40.0000 mg | DELAYED_RELEASE_TABLET | Freq: Every day | ORAL | 1 refills | Status: AC | PRN

## 2024-06-18 MED ORDER — BASAGLAR KWIKPEN 100 UNIT/ML ~~LOC~~ SOPN: 50.0000 [IU] | PEN_INJECTOR | Freq: Every day | SUBCUTANEOUS | 1 refills | Status: AC

## 2024-06-18 MED ORDER — EZETIMIBE 10 MG PO TABS: 10.0000 mg | ORAL_TABLET | Freq: Every day | ORAL | 3 refills | Status: AC

## 2024-06-18 MED ORDER — EMPAGLIFLOZIN 25 MG PO TABS: 25.0000 mg | ORAL_TABLET | Freq: Every day | ORAL | 3 refills | Status: AC

## 2024-06-18 NOTE — Patient Instructions (Addendum)
 Take Protonix  40 mg daily first thing in the morning on an empty stomach. I am referring you to gastroenterologist Dr. Therisa at [facility] for evaluation into abdominal discomfort.    I am repeating your vitamin D  level today. I will reach out to you with recommendation once I have the lab results.    Take Rosuvastatin  20 mg at bedtime.

## 2024-06-18 NOTE — Assessment & Plan Note (Addendum)
 Chronic GERD with burning sensation, burping. On Mounjaro  and uncontrolled DM II. D/D gastroparesis, GERD, medication side effect.  Previous non-adherence to Protonix . Discussed diabetes impact on gastric motility.  Started Protonix  40 mg daily, first thing in the morning on an empty stomach. Referred to gastroenterologist for further evaluation and potential endoscopy. Orders:   pantoprazole  (PROTONIX ) 40 MG tablet; Take 1 tablet (40 mg total) by mouth daily as needed. Take 30-60 minutes before breakfast   Ambulatory referral to Gastroenterology

## 2024-06-18 NOTE — Assessment & Plan Note (Addendum)
 Previous prescription not renewed. Currently on over-the-counter vitamin D . Discussed potency differences. Repeated vitamin D  level. If vitamin D  is low, will prescribe once weekly vitamin D  supplementation. Orders:   Vitamin D  (25 hydroxy)

## 2024-06-18 NOTE — Assessment & Plan Note (Deleted)
 SABRA

## 2024-06-18 NOTE — Assessment & Plan Note (Addendum)
 Associated with increased urinary frequency chronically. Likely multifactorial (on Jardiance , hyperglycemia, LUTS). Check PSA.  Monitor symptoms and adjust treatment as needed. Orders:   PSA

## 2024-06-18 NOTE — Progress Notes (Signed)
 Established Patient Office Visit TOC from Dr. Rejeana Dr. Maribeth    Subjective  Patient ID: Michael Jordan, male    DOB: 05-03-1956  Age: 68 y.o. MRN: [id number]  Chief Complaint  Patient presents with   Establish Care    Discussed the use of AI scribe software for clinical note transcription with the patient, who gave verbal consent to proceed.  History of Present Illness Michael Jordan is a 68 year old male with uncontrolled type 2 diabetes with diabetic complications on insulin , coronary artery disease who presents tp establish care with new PCP and medication management. He is accompanied by his wife, who is also his nurse.  - DM II with hyperlipidemia, proteinuria, insulin  dependent, hyperglycemia: Endocrinologist Dr. Damian, last visit 06/02/24 Last Hb A1c in 03/2024 was 10.5%.  On Jardiance  25 mg daily Mounjaro  dose increased from 10 mg weekly to 12.5 mg weekly Basaglar  insulin  50 units daily which he uses when he remembers.  His blood sugar levels remain high, often above 200 mg/dL. He experiences gastrointestinal discomfort with some oral medications, leading to inconsistent use. Discontinued metformin  in the past due to this.  Unsure if GI symptoms is worse on days he injects Mounjaro .  CAD, LDL above goal:  He has coronary artery disease and has had stents placed. He is not taking statins due to leg discomfort and tingling, which he associates with their use.  He takes aspirin  81 mg and Coreg  for heart health but is hesitant about statins due to side effects.  GERD, bloating:  He experiences gastrointestinal symptoms, including frequent diarrhea and stomach upset, which he attributes to his medications. He reports episodes of severe diarrhea that disrupt his sleep and daily activities. He has been prescribed Protonix  but has not been taking it regularly.  Vitamin D  deficiency:  He has a history of low vitamin D  levels and has been taking over-the-counter supplements  inconsistently. He reports not having a recent prescription for high-dose vitamin D .  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 He reports that his balance has been affected since starting metformin , which he has since discontinued. He is a former smoker and has not smoked for many years.  - He experiences frequent urination, especially at night, which he attributes to Jardiance . He also experiences episodes of incontinence and uses protective pads. He has a history of yeast infections in the past, which he manages with nystatin  powder. Requesting refill.     ROS As per HPI    Objective:     BP 120/70 (BP Location: Right Arm, Patient Position: Sitting, Cuff Size: Normal)   Pulse (!) 103   Temp 98.6 F (37 C) (Oral)   Ht 5' 11 (1.803 m)   Wt 269 lb 9.6 oz (122.3 kg)   SpO2 94%   BMI 37.60 kg/m      06/18/2024    1:15 PM 11/30/2023   11:38 AM 11/05/2023    4:11 PM  Depression screen PHQ 2/9  Decreased Interest 0 0 0  Down, Depressed, Hopeless 0 0 0  PHQ - 2 Score 0 0 0  Altered sleeping 0 0 0  Tired, decreased energy 0 0 0  Change in appetite 1 1 0  Feeling bad or failure about yourself  0 0 0  Trouble concentrating 0 0 0  Moving slowly or fidgety/restless 0 0 0  Suicidal thoughts 0 0 0  PHQ-9 Score 1 1  0   Difficult doing work/chores Not difficult at all Not difficult at all Not difficult at all     Data saved with a previous flowsheet row definition      06/18/2024    1:15 PM 11/30/2023   11:38 AM 11/05/2023    4:11 PM 09/11/2023    2:04 PM  GAD 7 : Generalized Anxiety Score  Nervous, Anxious, on Edge 0 0 0 0  Control/stop worrying 0 0 0 0  Worry too much - different things 0 0 0 0  Trouble relaxing 0 0 0 0  Restless 0 0 0 0  Easily annoyed or irritable 0 0 0 0  Afraid - awful might happen 0 0 0 0  Total GAD 7 Score 0 0 0 0  Anxiety Difficulty Not difficult at all Not difficult at all Not difficult at all  Not difficult at all      06/18/2024    1:15 PM 11/30/2023   11:38 AM 11/05/2023    4:11 PM  Depression screen PHQ 2/9  Decreased Interest 0 0 0  Down, Depressed, Hopeless 0 0 0  PHQ - 2 Score 0 0 0  Altered sleeping 0 0 0  Tired, decreased energy 0 0 0  Change in appetite 1 1 0  Feeling bad or failure about yourself  0 0 0  Trouble concentrating 0 0 0  Moving slowly or fidgety/restless 0 0 0  Suicidal thoughts 0 0 0  PHQ-9 Score 1 1  0   Difficult doing work/chores Not difficult at all Not difficult at all Not difficult at all     Data saved with a previous flowsheet row definition      06/18/2024    1:15 PM 11/30/2023   11:38 AM 11/05/2023    4:11 PM 09/11/2023    2:04 PM  GAD 7 : Generalized Anxiety Score  Nervous, Anxious, on Edge 0 0 0 0  Control/stop worrying 0 0 0 0  Worry too much - different things 0 0 0 0  Trouble relaxing 0 0 0 0  Restless 0 0 0 0  Easily annoyed or irritable 0 0 0 0  Afraid - awful might happen 0 0 0 0  Total GAD 7 Score 0 0 0 0  Anxiety Difficulty Not difficult at all Not difficult at all Not difficult at all Not difficult at all   SDOH Screenings   Food Insecurity: No Food Insecurity (09/17/2023)   Received from [facility]  Housing: Unknown (09/17/2023)   Received from [facility]  Transportation Needs: No Transportation Needs (09/17/2023)   Received from [facility]  Utilities: Not At Risk (09/17/2023)   Received from [facility]  Depression [phone]): Low Risk  (06/18/2024)  Financial Resource Strain: Low Risk  (09/17/2023)   Received from [facility]  Tobacco Use: Medium Risk (06/18/2024)     Physical Exam Constitutional:      Appearance: He is obese.  HENT:     Head: Normocephalic and atraumatic.     Right Ear: Tympanic membrane normal.     Left Ear: Tympanic membrane normal.     Mouth/Throat:     Mouth: Mucous membranes are moist.   Cardiovascular:     Rate and Rhythm: Normal rate.  Pulmonary:     Effort: Pulmonary effort is normal.     Breath sounds: Normal breath sounds.  Abdominal:  General: Bowel sounds are normal.     Tenderness: There is no abdominal tenderness. There is no guarding.  Musculoskeletal:     Right lower leg: No edema.     Left lower leg: No edema.  Skin:    General: Skin is warm.  Neurological:     Mental Status: He is alert and oriented to person, place, and time.  Psychiatric:        Mood and Affect: Mood normal.        No results found for any visits on 06/18/24.  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  Discussed hyperglycemia risks and emphasized medication adherence. Continue Mounjaro  12.5 mg weekly. Continue Basaglar  50 units daily Continue Jardiance  25 mg daily. Encouraged dietary modifications and portion control. Encouraged regular exercise, aiming for 40 minutes of moderate intensity exercise 5 days a week. Continue f/u with endocrinologist.  Orders:   empagliflozin  (JARDIANCE ) 25 MG TABS tablet; Take 1 tablet (25 mg total) by mouth daily before breakfast.  Gastroesophageal reflux disease, unspecified whether esophagitis present Chronic GERD with burning sensation, burping. On Mounjaro  and uncontrolled DM II. D/D gastroparesis, GERD, medication side effect.  Previous non-adherence to Protonix . Discussed diabetes impact on gastric motility.  Started Protonix  40 mg daily, first thing in the morning on an empty  stomach. Referred to gastroenterologist for further evaluation and potential endoscopy. Orders:   pantoprazole  (PROTONIX ) 40 MG tablet; Take 1 tablet (40 mg total) by mouth daily as needed. Take 30-60 minutes before breakfast   Ambulatory referral to Gastroenterology  Hyperlipidemia associated with type 2 diabetes mellitus (HCC) Hyperlipidemia with LDL at 124 mg/dL, goal <29. Coronary artery disease with prior stent. Non-adherence to statin.  - Restarted rosuvastatin  20 mg at bedtime. - Restarted Zetia  10 mg daily.  - Prescription per cardiology.  - Has f/u appointment with cardiologist in December, recommend discuss CAD, therapy importance with his cardiologist.     Vitamin D  deficiency Previous prescription not renewed. Currently on over-the-counter vitamin D . Discussed potency differences. Repeated vitamin D  level. If vitamin D  is low, will prescribe once weekly vitamin D  supplementation. Orders:   Vitamin D  (25 hydroxy)  Screening for prostate cancer Associated with increased urinary frequency chronically. Likely multifactorial (on Jardiance , hyperglycemia, LUTS). Check PSA.  Monitor symptoms and adjust treatment as needed. Orders:   PSA  Intertrigo Recurrent erythema intertrigo in groin, exacerbated by heat and moisture. Previously treated with nystatin  powder.Prescribed nystatin  powder for topical application. Advised on keeping the area dry and using antifungal powder as needed. Since patient is on Jardiance  recommend he reaches out to our clinic if symptoms worsens.  Orders:   nystatin  powder; Apply 1 Application topically 3 (three) times daily as needed.  I personally spent a total of 55 minutes in the care of the patient today including preparing to see the patient, getting/reviewing separately obtained history, performing a medically appropriate exam/evaluation, counseling and educating, placing orders, referring and communicating with other health care professionals,  documenting clinical information in the EHR, independently interpreting results, communicating results, and counseling.  Return  for Virtual visit for FMLA paperwork, 4:30 PM, chronic f/u in 3 months.   Luke Shade, MD

## 2024-06-18 NOTE — Assessment & Plan Note (Addendum)
 Hyperlipidemia with LDL at 124 mg/dL, goal <29. Coronary artery disease with prior stent. Non-adherence to statin.  - Restarted rosuvastatin  20 mg at bedtime. - Restarted Zetia  10 mg daily.  - Prescription per cardiology.  - Has f/u appointment with cardiologist in December, recommend discuss CAD, therapy importance with his cardiologist.

## 2024-06-18 NOTE — Assessment & Plan Note (Addendum)
 Type 2 diabetes mellitus with diabetic kidney disease and other complications Chronic hyperglycemia with A1c reduced to 10.5% on Mounjaro  12.5 mg weekly, Jardiance  25 mg daily and Basaglar  50 U daily.  Discussed hyperglycemia risks and emphasized medication adherence. Continue Mounjaro  12.5 mg weekly. Continue Basaglar  50 units daily Continue Jardiance  25 mg daily. Encouraged dietary modifications and portion control. Encouraged regular exercise, aiming for 40 minutes of moderate intensity exercise 5 days a week. Continue f/u with endocrinologist.  Orders:   empagliflozin  (JARDIANCE ) 25 MG TABS tablet; Take 1 tablet (25 mg total) by mouth daily before breakfast.

## 2024-06-18 NOTE — Telephone Encounter (Signed)
 Pt wife drop off FMLA forms to be filled out the forms has been placed in back mail box.

## 2024-06-18 NOTE — Assessment & Plan Note (Addendum)
 Recurrent erythema intertrigo in groin, exacerbated by heat and moisture. Previously treated with nystatin  powder.Prescribed nystatin  powder for topical application. Advised on keeping the area dry and using antifungal powder as needed. Since patient is on Jardiance  recommend he reaches out to our clinic if symptoms worsens.  Orders:   nystatin  powder; Apply 1 Application topically 3 (three) times daily as needed.

## 2024-06-19 LAB — PSA: Prostate Specific Ag, Serum: 0.7 ng/mL (ref 0.0–4.0)

## 2024-06-19 LAB — VITAMIN D 25 HYDROXY (VIT D DEFICIENCY, FRACTURES): Vit D, 25-Hydroxy: 20.5 ng/mL — ABNORMAL LOW (ref 30.0–100.0)

## 2024-06-22 ENCOUNTER — Ambulatory Visit: Payer: Self-pay

## 2024-06-22 DIAGNOSIS — E559 Vitamin D deficiency, unspecified: Secondary | ICD-10-CM

## 2024-06-22 MED ORDER — VITAMIN D (ERGOCALCIFEROL) 1.25 MG (50000 UNIT) PO CAPS: 50000.0000 [IU] | ORAL_CAPSULE | ORAL | 0 refills | Status: AC

## 2024-06-26 ENCOUNTER — Ambulatory Visit

## 2024-06-26 VITALS — BP 130/78 | HR 91 | Temp 98.0°F | Ht 71.0 in | Wt 275.8 lb

## 2024-06-26 DIAGNOSIS — Z7984 Long term (current) use of oral hypoglycemic drugs: Secondary | ICD-10-CM

## 2024-06-26 DIAGNOSIS — R350 Frequency of micturition: Secondary | ICD-10-CM | POA: Insufficient documentation

## 2024-06-26 DIAGNOSIS — E1165 Type 2 diabetes mellitus with hyperglycemia: Secondary | ICD-10-CM

## 2024-06-26 DIAGNOSIS — Z794 Long term (current) use of insulin: Secondary | ICD-10-CM | POA: Diagnosis not present

## 2024-06-26 NOTE — Progress Notes (Signed)
   Acute Office Visit  Subjective:    Patient ID: Michael Jordan, male    DOB: Jan 01, 1956, 68 y.o.   MRN: [id number]  Chief Complaint  Patient presents with   Form Completion    Discussed the use of AI scribe software for clinical note transcription with the patient, who gave verbal consent to proceed.  History of Present Illness Michael Jordan is a 68 year old male with uncontrolled DM II with co morbidities, on insulin , urinary urgency, obesity, GERD, CAD, HTN who presents to get FMLA form filled out.   He experiences gastrointestinal issues, frequent urination that sometimes prevent him from working. 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 He mentions a recent episode after consuming a milkshake. He works part-time at [employer], typically 20 hours or less per week, with shifts varying between 4 to 6 hours. He enjoys his job and is cautious not to abuse sick leave, despite needing time off during gastrointestinal flare-ups.     ROS As per HPI    Objective:    BP 130/78 (BP Location: Left Arm, Patient Position: Sitting)   Pulse 91   Temp 98 F (36.7 C) (Oral)   Ht 5' 11 (1.803 m)   Wt 275 lb 12.8 oz (125.1 kg)   SpO2 95%   BMI 38.47 kg/m    Physical Exam Constitutional:      General: He is not in acute distress.    Appearance: He is obese.  HENT:     Head: Normocephalic and atraumatic.  Cardiovascular:     Rate and Rhythm: Normal rate.  Pulmonary:     Effort: Pulmonary effort is normal.     Breath sounds: Normal breath sounds.  Abdominal:     Tenderness: There is no abdominal tenderness.  Neurological:     Mental Status: He is oriented to person, place, and time.  Psychiatric:        Mood and Affect: Mood normal.     No results found for any visits on 06/26/24.     Assessment & Plan:   Assessment & Plan Uncontrolled type 2 diabetes mellitus with  hyperglycemia (HCC) Contributing to urinary frequency, nausea intermittently interfering work. Which can be from disease process itself and potential medication s/e (Jardiance , insulin , Mounjaro ). He works part-time at [employer], typically 20 hours or less per week, with shifts varying between 4 to 6 hours.  FMLA form filled so that patient can make it to his appointments to see PCP, endocrinologist, cardiologist, get frequent bathroom breaks. FMLA form to be faxed and original copy to be uploaded to his chart.  Discussed importance of healthy lifestyle for weight and health management. Encouraged regular physical activity.  Continue f/u endocrinology.  Continue Jardiance , Mounjaro , Basaglar  insulin . Monitor blood glucose regularly.     Return in about 3 months (around 10/01/2024).  Luke Shade, MD

## 2024-06-26 NOTE — Assessment & Plan Note (Addendum)
 Contributing to urinary frequency, nausea intermittently interfering work. Which can be from disease process itself and potential medication s/e (Jardiance , insulin , Mounjaro ). He works part-time at [employer], typically 20 hours or less per week, with shifts varying between 4 to 6 hours.  FMLA form filled so that patient can make it to his appointments to see PCP, endocrinologist, cardiologist, get frequent bathroom breaks. FMLA form to be faxed and original copy to be uploaded to his chart.  Discussed importance of healthy lifestyle for weight and health management. Encouraged regular physical activity.  Continue f/u endocrinology.  Continue Jardiance , Mounjaro , Basaglar  insulin . Monitor blood glucose regularly.

## 2024-07-08 ENCOUNTER — Encounter: Payer: Self-pay | Admitting: Cardiovascular Disease

## 2024-07-08 ENCOUNTER — Ambulatory Visit: Attending: Cardiovascular Disease | Admitting: Cardiovascular Disease

## 2024-07-08 VITALS — BP 144/78 | HR 82 | Ht 71.0 in | Wt 276.4 lb

## 2024-07-08 DIAGNOSIS — E785 Hyperlipidemia, unspecified: Secondary | ICD-10-CM

## 2024-07-08 DIAGNOSIS — I2510 Atherosclerotic heart disease of native coronary artery without angina pectoris: Secondary | ICD-10-CM

## 2024-07-08 DIAGNOSIS — I10 Essential (primary) hypertension: Secondary | ICD-10-CM | POA: Diagnosis not present

## 2024-07-08 DIAGNOSIS — I5022 Chronic systolic (congestive) heart failure: Secondary | ICD-10-CM | POA: Diagnosis not present

## 2024-07-08 MED ORDER — CARVEDILOL 6.25 MG PO TABS: 6.2500 mg | ORAL_TABLET | Freq: Two times a day (BID) | ORAL | 3 refills | Status: AC

## 2024-07-08 NOTE — Progress Notes (Signed)
 Cardiology Office Note   Date:  07/08/2024   ID:  Michael Jordan, DOB April 05, 1956, MRN [id number]  PCP:  Abbey Bruckner, MD  Cardiologist:   Deatrice Cage, MD   Chief Complaint  Patient presents with   Follow-up    6 month follow up  / pt has been doing well with no complaints of chest pain, chest pressure or SOB, medication reviewed verbally with patient .       History of Present Illness:  Michael Jordan is a 68 y.o. male who presents for a follow-up visit regarding coronary artery disease.  He was hospitalized in March 2017 with anterior ST elevation myocardial infarction. Cardiac catheterization showed occluded proximal LAD with mild disease affecting the diagonals, chronically occluded right coronary artery with left to right collaterals and moderate left circumflex disease. He underwent successful angioplasty and drug-eluting stent placement in the proximal LAD. Ejection fraction was 40-45% by LV gram and 35-40% by echocardiogram.  He has other chronic medical conditions that include morbid obesity, hypertension, hyperlipidemia and type 2 diabetes. He quit Smoking in November 2016  He lost to follow-up for few years due to loss of insurance but reestablished and went back on medications.  He had an echocardiogram done in February of this year which showed an EF of 40 to 45% with mild mitral regurgitation.  He was switched to Entresto .  He continues to work part-time at [employer] .  He has been more compliant with medications especially with assistance of his wife.    He has been doing well with no chest pain, shortness of breath or palpitations.  He continues to go dancing on Fridays.   Past Medical History:  Diagnosis Date   Abnormal stress test    CAD (coronary artery disease)    a. 09/2015 Ant STEMI/PCI: LAD 100p (3.0x18 Xience Alpine DES), RCA 80p/146m (CTO)->fills via collats, EF 45-50%; b. 10/2015 MV: fixed septal/inf defects w/ dramatic ECG changes; c. 10/2015 Cath:  LM nl, LAD patent stent, D1 40, SP1 40, LCX 59m, RCA 80p/173m, EF 45-50%-->Med Rx.   Cardiomyopathy, ischemic    a. 09/2015 Echo: EF 35-40%, sev apical, periapical, antapical HK, mild to mod ant, inf HK, Gr1 DD; b. 11/2019 Echo: EF 45-50%, GrI DD; c. 08/2022 Echo: EF 40-45%, glob HK, mod LVH, nl RV fxn, mild MR.   Heart failure with mid-range ejection fraction (HCC)    a. 09/2015 Echo: EF 35-40%; b. 11/2019 Echo: EF 45-50%; c. 08/2022 Echo: EF 40-45%, glob HK.   History of tobacco abuse    a. Quit 05/2015.   Hyperlipidemia    Morbid obesity (HCC)    Pulmonary nodules    Type II diabetes mellitus (HCC)     Past Surgical History:  Procedure Laterality Date   CARDIAC CATHETERIZATION N/A 09/30/2015   Procedure: Left Heart Cath and Coronary Angiography;  Surgeon: Deatrice DELENA Cage, MD;  Location: [facility] INVASIVE CV LAB;  Service: Cardiovascular;  Laterality: N/A;   CARDIAC CATHETERIZATION N/A 09/30/2015   Procedure: Coronary Stent Intervention;  Surgeon: Deatrice DELENA Cage, MD;  Location: [facility] INVASIVE CV LAB;  Service: Cardiovascular;  Laterality: N/A;   CARDIAC CATHETERIZATION N/A 11/11/2015   Procedure: Left Heart Cath and Coronary Angiography;  Surgeon: Deatrice DELENA Cage, MD;  Location: [facility] INVASIVE CV LAB;  Service: Cardiovascular;  Laterality: N/A;   COLONOSCOPY WITH PROPOFOL  N/A 11/14/2022   Procedure: COLONOSCOPY WITH PROPOFOL ;  Surgeon: Therisa Bi, MD;  [facility] ENDOSCOPY;  Service: Gastroenterology;  Laterality: N/A;   TONSILLECTOMY     TONSILLECTOMY       Current Outpatient Medications  Medication Sig Dispense Refill   Accu-Chek Softclix Lancets lancets Use to check glucose 1x/day. Dx code E11.9. 204 each 3   aspirin  81 MG chewable tablet Chew 1 tablet (81 mg total) by mouth daily.     blood glucose meter kit and supplies KIT Dispense based on patient and insurance preference. Use once daily as directed. (FOR ICD-10 E11.9). 1 each 0   carvedilol  (COREG ) 3.125 MG tablet TAKE 1 TABLET  BY MOUTH 2 TIMES DAILY. 180 tablet 2   Continuous Glucose Sensor (FREESTYLE LIBRE 3 SENSOR) MISC PLACE 1 SENSOR ON THE SKIN EVERY 14 DAYS. USE TO CHECK GLUCOSE CONTINUOUSLY 2 each 2   empagliflozin  (JARDIANCE ) 25 MG TABS tablet Take 1 tablet (25 mg total) by mouth daily before breakfast. 90 tablet 3   ezetimibe  (ZETIA ) 10 MG tablet Take 1 tablet (10 mg total) by mouth daily. 90 tablet 3   Insulin  Glargine (BASAGLAR  KWIKPEN) 100 UNIT/ML Inject 50 Units into the skin daily. 15 mL 1   Insulin  Pen Needle (ULTRA FLO INSULIN  PEN NEEDLES) 33G X 4 MM MISC Use daily with Kwikpen 100 each 3   nitroGLYCERIN  (NITROSTAT ) 0.4 MG SL tablet Place 1 tablet (0.4 mg total) under the tongue every 5 (five) minutes as needed for chest pain. 25 tablet 0   nystatin  powder Apply 1 Application topically 3 (three) times daily as needed. 270 g 3   ONETOUCH ULTRA test strip USE ONCE DAILY AS DIRECTED E11.9 (PT USES ONE TOUCH) 100 strip 1   pantoprazole  (PROTONIX ) 40 MG tablet Take 1 tablet (40 mg total) by mouth daily as needed. Take 30-60 minutes before breakfast 90 tablet 1   rosuvastatin  (CRESTOR ) 20 MG tablet TAKE 1 TABLET BY MOUTH EVERY DAY 90 tablet 2   sacubitril -valsartan  (ENTRESTO ) 24-26 MG Take 1 tablet by mouth 2 (two) times daily. 60 tablet 2   Vitamin D , Ergocalciferol , (DRISDOL ) 1.25 MG (50000 UNIT) CAPS capsule Take 1 capsule (50,000 Units total) by mouth every 7 (seven) days. 12 capsule 0   No current facility-administered medications for this visit.    Allergies:   Patient has no known allergies.    Social History:  The patient  reports that he quit smoking about 9 years ago. His smoking use included cigarettes. He started smoking about 53 years ago. He has a 22 pack-year smoking history. He has never used smokeless tobacco. He reports current alcohol use. He reports that he does not use drugs.   Family History:  The patient's family history includes Diabetes in his mother; Heart attack in his father,  mother, and paternal grandfather.    ROS:  Please see the history of present illness.   Otherwise, review of systems are positive for none.   All other systems are reviewed and negative.    PHYSICAL EXAM: VS:  BP (!) 144/78 (BP Location: Left Arm, Patient Position: Sitting, Cuff Size: Normal)   Pulse 82   Ht 5' 11 (1.803 m)   Wt 276 lb 6.4 oz (125.4 kg)   SpO2 97%   BMI 38.55 kg/m  , BMI Body mass index is 38.55 kg/m. GEN: Well nourished, well developed, in no acute distress  HEENT: normal  Neck: no JVD, carotid bruits, or masses Cardiac: RRR; no murmurs, rubs, or gallops,no edema  Respiratory:  clear to auscultation bilaterally, normal work of breathing GI: soft, nontender, nondistended, + BS  MS: no deformity or atrophy  Skin: warm and dry, no rash Neuro:  Strength and sensation are intact Psych: euthymic mood, full affect   EKG:  EKG is ordered today. EKG showed: Normal sinus rhythm Right bundle branch block When compared with ECG of 09-Jan-2024 13:41, No significant change was found     Recent Labs: 09/14/2023: ALT 17; BUN 14; Creatinine, Ser 0.91; Hemoglobin 15.9; Magnesium 2.0; Platelets 301.0; Potassium 4.5; Sodium 135; TSH 1.02    Lipid Panel    Component Value Date/Time   CHOL 145 09/14/2023 1018   TRIG 151.0 (H) 09/14/2023 1018   HDL 46.60 09/14/2023 1018   CHOLHDL 3 09/14/2023 1018   VLDL 30.2 09/14/2023 1018   LDLCALC 69 09/14/2023 1018   LDLDIRECT 113.0 07/26/2022 1544      Wt Readings from Last 3 Encounters:  07/08/24 276 lb 6.4 oz (125.4 kg)  06/26/24 275 lb 12.8 oz (125.1 kg)  06/18/24 269 lb 9.6 oz (122.3 kg)        ASSESSMENT AND PLAN:  1.  Coronary artery disease involving native coronary arteries without angina: He is doing very well with no anginal symptoms.  Continue aspirin  indefinitely.    2. Hyperlipidemia: Previous LDL was 186.  Most recent lipid profile showed an LDL of 113 on rosuvastatin  and ezetimibe  but he does admit that  he does not take his medications all the time.  I discussed the importance of compliance.    3.  Chronic systolic heart failure due to ischemic cardiomyopathy: Most recent ejection fraction of 40 to 45%.  No evidence of volume overload.  Continue carvedilol , Jardiance  and Entresto .  I increase carvedilol  to 6.25 mg twice daily.   4. Essential hypertension: Blood pressure is mildly elevated.  Carvedilol  was increased.   Disposition:   FU with me in 6 months  Signed,  Deatrice Cage, MD  07/08/2024 1:49 PM     [facility]

## 2024-07-08 NOTE — Patient Instructions (Signed)
 Medication Instructions:  INCREASE  the  Carvedilol  to 6.25 mg twice daily  *If you need a refill on your cardiac medications before your next appointment, please call your pharmacy*  Lab Work: None ordered If you have labs (blood work) drawn today and your tests are completely normal, you will receive your results only by: MyChart Message (if you have MyChart) OR A paper copy in the mail If you have any lab test that is abnormal or we need to change your treatment, we will call you to review the results.  Testing/Procedures: None ordered  Follow-Up: At [facility], you and your health needs are our priority.  As part of our continuing mission to provide you with exceptional heart care, our providers are all part of one team.  This team includes your primary Cardiologist (physician) and Advanced Practice Providers or APPs (Physician Assistants and Nurse Practitioners) who all work together to provide you with the care you need, when you need it.  Your next appointment:   6 month(s)  Provider:   You may see Deatrice Cage, MD or one of the following Advanced Practice Providers on your designated Care Team:   Lonni Meager, NP Lesley Maffucci, PA-C Bernardino Bring, PA-C Cadence Tomball, PA-C Tylene Lunch, NP Barnie Hila, NP    We recommend signing up for the patient portal called MyChart.  Sign up information is provided on this After Visit Summary.  MyChart is used to connect with patients for Virtual Visits (Telemedicine).  Patients are able to view lab/test results, encounter notes, upcoming appointments, etc.  Non-urgent messages can be sent to your provider as well.   To learn more about what you can do with MyChart, go to [email or web address].

## 2024-07-10 NOTE — Progress Notes (Signed)
 Michael Jordan                                          MRN: [id number]   07/10/2024   The [facility] Quality Team Specialist reviewed this patient medical record for the purposes of chart review for care gap closure. The following were reviewed: chart review for care gap closure-glycemic status assessment.    [facility] Quality Team

## 2024-08-12 ENCOUNTER — Telehealth: Payer: Self-pay

## 2024-08-12 NOTE — Telephone Encounter (Signed)
 Copied from CRM #[id number]. Topic: General - Other >> Aug 12, 2024 10:39 AM Ahlexyia S wrote: Reason for CRM: Pt wife called in requesting for clinic to fax over pt medication list to the listed office below: Pt wife stated it is urgent due to pt currently being at office.  [facility] Eddye Ruth MD [phone]- fax number

## 2024-08-12 NOTE — Telephone Encounter (Signed)
 Requested Medication List has been faxed to provided fax contact information. Confirmation page has printed with fax completed.

## 2024-10-01 ENCOUNTER — Ambulatory Visit
